# Patient Record
Sex: Male | Born: 1935 | Race: Black or African American | Hispanic: No | Marital: Single | Smoking: Never smoker
Health system: Southern US, Community
[De-identification: ages and names within clinical notes are randomized; demographics above are authoritative.]

## PROBLEM LIST (undated history)

## (undated) ENCOUNTER — Emergency Department (HOSPITAL_COMMUNITY): Discharge status: Absent without leave | Payer: Medicare Other

## (undated) DIAGNOSIS — C9000 Multiple myeloma not having achieved remission: Secondary | ICD-10-CM

## (undated) DIAGNOSIS — I5022 Chronic systolic (congestive) heart failure: Secondary | ICD-10-CM

## (undated) DIAGNOSIS — C61 Malignant neoplasm of prostate: Secondary | ICD-10-CM

## (undated) DIAGNOSIS — N183 Chronic kidney disease, stage 3 unspecified: Secondary | ICD-10-CM

## (undated) DIAGNOSIS — K279 Peptic ulcer, site unspecified, unspecified as acute or chronic, without hemorrhage or perforation: Secondary | ICD-10-CM

## (undated) DIAGNOSIS — Z95810 Presence of automatic (implantable) cardiac defibrillator: Secondary | ICD-10-CM

## (undated) DIAGNOSIS — I5043 Acute on chronic combined systolic (congestive) and diastolic (congestive) heart failure: Secondary | ICD-10-CM

## (undated) DIAGNOSIS — I2510 Atherosclerotic heart disease of native coronary artery without angina pectoris: Secondary | ICD-10-CM

## (undated) DIAGNOSIS — E119 Type 2 diabetes mellitus without complications: Secondary | ICD-10-CM

## (undated) DIAGNOSIS — I219 Acute myocardial infarction, unspecified: Secondary | ICD-10-CM

## (undated) DIAGNOSIS — D649 Anemia, unspecified: Secondary | ICD-10-CM

## (undated) DIAGNOSIS — C801 Malignant (primary) neoplasm, unspecified: Secondary | ICD-10-CM

## (undated) DIAGNOSIS — I10 Essential (primary) hypertension: Secondary | ICD-10-CM

## (undated) HISTORY — PX: APPENDECTOMY: SHX54

## (undated) HISTORY — PX: CARDIAC DEFIBRILLATOR PLACEMENT: SHX171

---

## 1997-11-24 HISTORY — PX: BACK SURGERY: SHX140

## 1998-11-24 HISTORY — PX: PROSTATE SURGERY: SHX751

## 2013-06-01 ENCOUNTER — Emergency Department (HOSPITAL_COMMUNITY)
Admission: EM | Admit: 2013-06-01 | Discharge: 2013-06-01 | Disposition: A | Discharge status: Home / Self Care | Payer: No Typology Code available for payment source | Attending: Emergency Medicine | Admitting: Emergency Medicine

## 2013-06-01 ENCOUNTER — Emergency Department (HOSPITAL_COMMUNITY): Payer: No Typology Code available for payment source

## 2013-06-01 DIAGNOSIS — S4980XA Other specified injuries of shoulder and upper arm, unspecified arm, initial encounter: Secondary | ICD-10-CM | POA: Insufficient documentation

## 2013-06-01 DIAGNOSIS — Y9389 Activity, other specified: Secondary | ICD-10-CM | POA: Insufficient documentation

## 2013-06-01 DIAGNOSIS — S0990XA Unspecified injury of head, initial encounter: Secondary | ICD-10-CM | POA: Insufficient documentation

## 2013-06-01 DIAGNOSIS — S46909A Unspecified injury of unspecified muscle, fascia and tendon at shoulder and upper arm level, unspecified arm, initial encounter: Secondary | ICD-10-CM | POA: Insufficient documentation

## 2013-06-01 DIAGNOSIS — Y92410 Unspecified street and highway as the place of occurrence of the external cause: Secondary | ICD-10-CM | POA: Insufficient documentation

## 2013-06-01 DIAGNOSIS — S6990XA Unspecified injury of unspecified wrist, hand and finger(s), initial encounter: Secondary | ICD-10-CM | POA: Insufficient documentation

## 2013-06-01 DIAGNOSIS — S199XXA Unspecified injury of neck, initial encounter: Secondary | ICD-10-CM | POA: Insufficient documentation

## 2013-06-01 DIAGNOSIS — I4891 Unspecified atrial fibrillation: Secondary | ICD-10-CM | POA: Insufficient documentation

## 2013-06-01 DIAGNOSIS — S0993XA Unspecified injury of face, initial encounter: Secondary | ICD-10-CM | POA: Insufficient documentation

## 2013-06-01 DIAGNOSIS — S59909A Unspecified injury of unspecified elbow, initial encounter: Secondary | ICD-10-CM | POA: Insufficient documentation

## 2013-06-01 LAB — COMPREHENSIVE METABOLIC PANEL
ALT: 27 U/L (ref 0–53)
Alkaline Phosphatase: 64 U/L (ref 39–117)
CO2: 28 mEq/L (ref 19–32)
GFR calc Af Amer: 55 mL/min — ABNORMAL LOW (ref >90)
GFR calc non Af Amer: 48 mL/min — ABNORMAL LOW (ref >90)
Glucose, Bld: 84 mg/dL (ref 70–99)
Potassium: 3.2 mEq/L — ABNORMAL LOW (ref 3.5–5.1)
Sodium: 141 mEq/L (ref 135–145)

## 2013-06-01 LAB — CBC
Hemoglobin: 12.3 g/dL — ABNORMAL LOW (ref 13.0–17.0)
RBC: 4.3 MIL/uL (ref 4.22–5.81)

## 2013-06-01 MED ORDER — IBUPROFEN 800 MG PO TABS: 800.0000 mg | ORAL_TABLET | Freq: Once | ORAL | Status: DC

## 2013-06-01 MED ORDER — KETOROLAC TROMETHAMINE 30 MG/ML IJ SOLN
15.0000 mg | Freq: Once | INTRAMUSCULAR | Status: AC
  Administered 2013-06-01: 15 mg via INTRAVENOUS
  Filled 2013-06-01: qty 1

## 2013-06-01 MED ORDER — ASPIRIN 81 MG PO CHEW
324.0000 mg | CHEWABLE_TABLET | Freq: Once | ORAL | Status: AC
  Administered 2013-06-01: 324 mg via ORAL
  Filled 2013-06-01: qty 4
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
## 2016-11-24 HISTORY — PX: CARDIAC DEFIBRILLATOR PLACEMENT: SHX171

## 2016-11-24 HISTORY — PX: CORONARY ANGIOPLASTY WITH STENT PLACEMENT: SHX49

## 2017-02-21 ENCOUNTER — Emergency Department (HOSPITAL_COMMUNITY): Payer: Medicare Other

## 2017-02-21 ENCOUNTER — Encounter (HOSPITAL_COMMUNITY): Payer: Self-pay | Admitting: Vascular Surgery

## 2017-02-21 ENCOUNTER — Inpatient Hospital Stay (HOSPITAL_COMMUNITY)
Admission: EM | Admit: 2017-02-21 | Discharge: 2017-02-24 | DRG: 291 | Disposition: A | Discharge status: Home / Self Care | Payer: Medicare Other | Attending: Internal Medicine | Admitting: Internal Medicine

## 2017-02-21 DIAGNOSIS — R079 Chest pain, unspecified: Secondary | ICD-10-CM

## 2017-02-21 DIAGNOSIS — N183 Chronic kidney disease, stage 3 unspecified: Secondary | ICD-10-CM

## 2017-02-21 DIAGNOSIS — Z7984 Long term (current) use of oral hypoglycemic drugs: Secondary | ICD-10-CM

## 2017-02-21 DIAGNOSIS — E1122 Type 2 diabetes mellitus with diabetic chronic kidney disease: Secondary | ICD-10-CM

## 2017-02-21 DIAGNOSIS — I5043 Acute on chronic combined systolic (congestive) and diastolic (congestive) heart failure: Secondary | ICD-10-CM | POA: Diagnosis present

## 2017-02-21 DIAGNOSIS — I130 Hypertensive heart and chronic kidney disease with heart failure and stage 1 through stage 4 chronic kidney disease, or unspecified chronic kidney disease: Secondary | ICD-10-CM | POA: Diagnosis not present

## 2017-02-21 DIAGNOSIS — R001 Bradycardia, unspecified: Secondary | ICD-10-CM | POA: Diagnosis not present

## 2017-02-21 DIAGNOSIS — Z7902 Long term (current) use of antithrombotics/antiplatelets: Secondary | ICD-10-CM

## 2017-02-21 DIAGNOSIS — I2510 Atherosclerotic heart disease of native coronary artery without angina pectoris: Secondary | ICD-10-CM | POA: Diagnosis present

## 2017-02-21 DIAGNOSIS — D696 Thrombocytopenia, unspecified: Secondary | ICD-10-CM | POA: Diagnosis present

## 2017-02-21 DIAGNOSIS — Z95810 Presence of automatic (implantable) cardiac defibrillator: Secondary | ICD-10-CM

## 2017-02-21 DIAGNOSIS — I252 Old myocardial infarction: Secondary | ICD-10-CM

## 2017-02-21 DIAGNOSIS — Z9861 Coronary angioplasty status: Secondary | ICD-10-CM

## 2017-02-21 DIAGNOSIS — I509 Heart failure, unspecified: Secondary | ICD-10-CM

## 2017-02-21 DIAGNOSIS — Z955 Presence of coronary angioplasty implant and graft: Secondary | ICD-10-CM

## 2017-02-21 DIAGNOSIS — I255 Ischemic cardiomyopathy: Secondary | ICD-10-CM | POA: Diagnosis present

## 2017-02-21 DIAGNOSIS — Z9111 Patient's noncompliance with dietary regimen: Secondary | ICD-10-CM

## 2017-02-21 DIAGNOSIS — I25119 Atherosclerotic heart disease of native coronary artery with unspecified angina pectoris: Secondary | ICD-10-CM | POA: Diagnosis not present

## 2017-02-21 DIAGNOSIS — Z8042 Family history of malignant neoplasm of prostate: Secondary | ICD-10-CM

## 2017-02-21 DIAGNOSIS — Z7982 Long term (current) use of aspirin: Secondary | ICD-10-CM

## 2017-02-21 DIAGNOSIS — Z8674 Personal history of sudden cardiac arrest: Secondary | ICD-10-CM

## 2017-02-21 DIAGNOSIS — Z8711 Personal history of peptic ulcer disease: Secondary | ICD-10-CM

## 2017-02-21 DIAGNOSIS — I10 Essential (primary) hypertension: Secondary | ICD-10-CM

## 2017-02-21 DIAGNOSIS — E785 Hyperlipidemia, unspecified: Secondary | ICD-10-CM | POA: Diagnosis present

## 2017-02-21 DIAGNOSIS — N400 Enlarged prostate without lower urinary tract symptoms: Secondary | ICD-10-CM | POA: Diagnosis present

## 2017-02-21 DIAGNOSIS — R51 Headache: Secondary | ICD-10-CM | POA: Diagnosis present

## 2017-02-21 DIAGNOSIS — D649 Anemia, unspecified: Secondary | ICD-10-CM | POA: Diagnosis present

## 2017-02-21 DIAGNOSIS — C9001 Multiple myeloma in remission: Secondary | ICD-10-CM | POA: Diagnosis present

## 2017-02-21 DIAGNOSIS — E876 Hypokalemia: Secondary | ICD-10-CM | POA: Diagnosis present

## 2017-02-21 DIAGNOSIS — M109 Gout, unspecified: Secondary | ICD-10-CM | POA: Diagnosis present

## 2017-02-21 DIAGNOSIS — Z79899 Other long term (current) drug therapy: Secondary | ICD-10-CM

## 2017-02-21 DIAGNOSIS — N179 Acute kidney failure, unspecified: Secondary | ICD-10-CM | POA: Diagnosis present

## 2017-02-21 HISTORY — DX: Type 2 diabetes mellitus without complications: E11.9

## 2017-02-21 HISTORY — DX: Malignant (primary) neoplasm, unspecified: C80.1

## 2017-02-21 HISTORY — DX: Presence of automatic (implantable) cardiac defibrillator: Z95.810

## 2017-02-21 HISTORY — DX: Chronic systolic (congestive) heart failure: I50.22

## 2017-02-21 HISTORY — DX: Acute on chronic combined systolic (congestive) and diastolic (congestive) heart failure: I50.43

## 2017-02-21 HISTORY — DX: Peptic ulcer, site unspecified, unspecified as acute or chronic, without hemorrhage or perforation: K27.9

## 2017-02-21 HISTORY — DX: Chronic kidney disease, stage 3 (moderate): N18.3

## 2017-02-21 HISTORY — DX: Chronic kidney disease, stage 3 unspecified: N18.30

## 2017-02-21 HISTORY — DX: Atherosclerotic heart disease of native coronary artery without angina pectoris: I25.10

## 2017-02-21 HISTORY — DX: Acute myocardial infarction, unspecified: I21.9

## 2017-02-21 HISTORY — DX: Essential (primary) hypertension: I10

## 2017-02-21 LAB — CBC
HEMATOCRIT: 28.2 % — AB (ref 39.0–52.0)
Hemoglobin: 9 g/dL — ABNORMAL LOW (ref 13.0–17.0)
MCH: 26.6 pg (ref 26.0–34.0)
MCHC: 31.9 g/dL (ref 30.0–36.0)
MCV: 83.4 fL (ref 78.0–100.0)
Platelets: 137 10*3/uL — ABNORMAL LOW (ref 150–400)
RBC: 3.38 MIL/uL — AB (ref 4.22–5.81)
RDW: 17.4 % — ABNORMAL HIGH (ref 11.5–15.5)
WBC: 5.6 10*3/uL (ref 4.0–10.5)

## 2017-02-21 LAB — BASIC METABOLIC PANEL
Anion gap: 8 (ref 5–15)
BUN: 23 mg/dL — AB (ref 6–20)
CO2: 26 mmol/L (ref 22–32)
Calcium: 8.5 mg/dL — ABNORMAL LOW (ref 8.9–10.3)
Chloride: 106 mmol/L (ref 101–111)
Creatinine, Ser: 1.83 mg/dL — ABNORMAL HIGH (ref 0.61–1.24)
GFR, EST AFRICAN AMERICAN: 38 mL/min — AB (ref >60)
GFR, EST NON AFRICAN AMERICAN: 33 mL/min — AB (ref >60)
GLUCOSE: 123 mg/dL — AB (ref 65–99)
Potassium: 3.1 mmol/L — ABNORMAL LOW (ref 3.5–5.1)
Sodium: 140 mmol/L (ref 135–145)

## 2017-02-21 LAB — POC OCCULT BLOOD, ED: FECAL OCCULT BLD: NEGATIVE

## 2017-02-21 LAB — RETICULOCYTES
RBC.: 3.18 MIL/uL — AB (ref 4.22–5.81)
RETIC COUNT ABSOLUTE: 38.2 10*3/uL (ref 19.0–186.0)
Retic Ct Pct: 1.2 % (ref 0.4–3.1)

## 2017-02-21 LAB — I-STAT TROPONIN, ED
TROPONIN I, POC: 0.03 ng/mL (ref 0.00–0.08)
Troponin i, poc: 0.04 ng/mL (ref 0.00–0.08)

## 2017-02-21 LAB — TROPONIN I: TROPONIN I: 0.03 ng/mL — AB (ref <0.03)

## 2017-02-21 LAB — FOLATE: FOLATE: 10.8 ng/mL (ref >5.9)

## 2017-02-21 LAB — BRAIN NATRIURETIC PEPTIDE: B Natriuretic Peptide: 941.9 pg/mL — ABNORMAL HIGH (ref 0.0–100.0)

## 2017-02-21 MED ORDER — HYDRALAZINE HCL 50 MG PO TABS
50.0000 mg | ORAL_TABLET | Freq: Two times a day (BID) | ORAL | Status: DC
  Administered 2017-02-22: 50 mg via ORAL
  Filled 2017-02-21: qty 1

## 2017-02-21 MED ORDER — NITROGLYCERIN 2 % TD OINT
0.5000 [in_us] | TOPICAL_OINTMENT | Freq: Four times a day (QID) | TRANSDERMAL | Status: DC
  Administered 2017-02-22 – 2017-02-23 (×3): 0.5 [in_us] via TOPICAL
  Filled 2017-02-21: qty 30

## 2017-02-21 MED ORDER — FUROSEMIDE 10 MG/ML IJ SOLN
40.0000 mg | Freq: Once | INTRAMUSCULAR | Status: AC
  Administered 2017-02-21: 40 mg via INTRAVENOUS
  Filled 2017-02-21: qty 4

## 2017-02-21 MED ORDER — MORPHINE SULFATE (PF) 4 MG/ML IV SOLN
2.0000 mg | Freq: Once | INTRAVENOUS | Status: AC
  Administered 2017-02-21: 2 mg via INTRAVENOUS
  Filled 2017-02-21: qty 1

## 2017-02-21 MED ORDER — ALLOPURINOL 300 MG PO TABS
300.0000 mg | ORAL_TABLET | Freq: Every day | ORAL | Status: DC
  Administered 2017-02-22 – 2017-02-24 (×3): 300 mg via ORAL
  Filled 2017-02-21 (×3): qty 1

## 2017-02-21 MED ORDER — POTASSIUM CHLORIDE CRYS ER 20 MEQ PO TBCR
40.0000 meq | EXTENDED_RELEASE_TABLET | Freq: Once | ORAL | Status: AC
  Administered 2017-02-21: 40 meq via ORAL
  Filled 2017-02-21: qty 2

## 2017-02-21 MED ORDER — TAMSULOSIN HCL 0.4 MG PO CAPS
0.4000 mg | ORAL_CAPSULE | Freq: Every day | ORAL | Status: DC
  Administered 2017-02-22 – 2017-02-24 (×3): 0.4 mg via ORAL
  Filled 2017-02-21 (×3): qty 1

## 2017-02-21 MED ORDER — ATORVASTATIN CALCIUM 40 MG PO TABS
40.0000 mg | ORAL_TABLET | Freq: Every day | ORAL | Status: DC
  Administered 2017-02-22 – 2017-02-23 (×3): 40 mg via ORAL
  Filled 2017-02-21 (×3): qty 1

## 2017-02-21 MED ORDER — ASPIRIN EC 81 MG PO TBEC
81.0000 mg | DELAYED_RELEASE_TABLET | Freq: Every day | ORAL | Status: DC
  Administered 2017-02-22 – 2017-02-24 (×3): 81 mg via ORAL
  Filled 2017-02-21 (×3): qty 1

## 2017-02-21 MED ORDER — CLOPIDOGREL BISULFATE 75 MG PO TABS
75.0000 mg | ORAL_TABLET | Freq: Every day | ORAL | Status: DC
  Administered 2017-02-22 – 2017-02-24 (×3): 75 mg via ORAL
  Filled 2017-02-21 (×3): qty 1

## 2017-02-21 MED ORDER — CARVEDILOL 12.5 MG PO TABS
12.5000 mg | ORAL_TABLET | Freq: Two times a day (BID) | ORAL | Status: DC
  Administered 2017-02-22 – 2017-02-24 (×5): 12.5 mg via ORAL
  Filled 2017-02-21 (×5): qty 1

## 2017-02-21 MED ORDER — FUROSEMIDE 10 MG/ML IJ SOLN
60.0000 mg | Freq: Two times a day (BID) | INTRAMUSCULAR | Status: DC
  Administered 2017-02-22: 60 mg via INTRAVENOUS
  Filled 2017-02-21: qty 6

## 2017-02-21 MED ORDER — LATANOPROST 0.005 % OP SOLN
1.0000 [drp] | Freq: Every day | OPHTHALMIC | Status: DC
  Administered 2017-02-22 – 2017-02-23 (×3): 1 [drp] via OPHTHALMIC
  Filled 2017-02-21: qty 2.5

## 2017-02-21 MED ORDER — ACETAMINOPHEN 325 MG PO TABS: 650.0000 mg | ORAL_TABLET | ORAL | Status: DC | PRN

## 2017-02-21 MED ORDER — INSULIN ASPART 100 UNIT/ML ~~LOC~~ SOLN: 0.0000 [IU] | Freq: Three times a day (TID) | SUBCUTANEOUS | Status: DC

## 2017-02-21 MED ORDER — POTASSIUM CHLORIDE CRYS ER 20 MEQ PO TBCR
40.0000 meq | EXTENDED_RELEASE_TABLET | Freq: Two times a day (BID) | ORAL | Status: DC
  Administered 2017-02-22: 40 meq via ORAL
  Filled 2017-02-21 (×2): qty 2

## 2017-02-21 MED ORDER — ONDANSETRON HCL 4 MG/2ML IJ SOLN: 4.0000 mg | Freq: Four times a day (QID) | INTRAMUSCULAR | Status: DC | PRN

## 2017-02-21 MED ORDER — ASPIRIN 81 MG PO CHEW
162.0000 mg | CHEWABLE_TABLET | Freq: Once | ORAL | Status: AC
  Administered 2017-02-21: 162 mg via ORAL
  Filled 2017-02-21: qty 2

## 2017-02-21 MED ORDER — AMIODARONE HCL 200 MG PO TABS
200.0000 mg | ORAL_TABLET | Freq: Two times a day (BID) | ORAL | Status: DC
  Administered 2017-02-22 – 2017-02-23 (×4): 200 mg via ORAL
  Filled 2017-02-21 (×4): qty 1

## 2017-02-21 MED ORDER — MORPHINE SULFATE (PF) 4 MG/ML IV SOLN: 2.0000 mg | INTRAVENOUS | Status: DC | PRN

## 2017-02-21 MED ORDER — ISOSORBIDE DINITRATE 20 MG PO TABS
20.0000 mg | ORAL_TABLET | Freq: Two times a day (BID) | ORAL | Status: DC
  Administered 2017-02-22 (×2): 20 mg via ORAL
  Filled 2017-02-21: qty 2
  Filled 2017-02-21 (×2): qty 1

## 2017-02-21 NOTE — H&P (Addendum)
History and Physical    David Guerra MKL:[id number] DOB: 1933-05-29 DOA: 02/21/2017  PCP: Pcp Not In System The patient is from [place].  I did not see any available records in Care Everywhere.  Patient coming from: Daughter's house  Chief Complaint: Chest pain, shortness of breath  HPI: David Guerra is a 83 y.o. gentleman with a history of CAD and recent MI in December 2017 S/P 2 stents, then AICD placement in January 2018, known CHF but he is not sure what type, Multiple Myeloma (in remission; no active treatment in 7 years), HTN, DM with CKD (last creatinine in this system was 4 years ago; 1.37 then; baseline unclear), and PUD who presents to the ED for evaluation of chest pain.  The patient describes a left sided chest pressure that waxes and wanes in intensity.  No specific triggers identified.  It is associated with shortness of breath and light-headedness.  No LOC.  No nausea, vomiting, or diaphoresis.  Pain is 8 out of 10 in intensity at its worst, 4 out of 10 after pain medication in the ED.  He feels that his symptoms are similar to his presentation in December, when he had his acute MI.  He admits that he wakes up most morning feeling fatigued, but he usually improves by midday.  Today, he had persist fatigue, shortness of breath with minimal exertion, then the left-sided chest pain, which prompted presentation.  He has not been shocked by his AICD since being in [place].  He has had progressive lower extremity edema.  He does not check his weight daily.  He reports last known weight to be around 183 lbs in [place].  He has been in [place] for about two weeks, and he admits to dietary indiscretions.  He does not follow a fluid restriction.  He has not had calf pain or tenderness.  ED Course: EKG shows an atrial paced rhythm.  Nonspecific ST changes.  Troponin 0.04.  BNP 942.  BUN 23, creatinine 1.83 (last known reference value 1.37 from 2014).  Hgb 9; it was 12 in  2014.  Platelets mildly decreased at 137.  Potassium 3.1.  Fecal occult blood test is negative.  Chest xray shows vascular congestion, mild cardiomegaly, and mild interstitial edema.  AICD interrogated in the ED; reportedly, no shocks have been delivered.  The patient has received ASA 162 in the ED (he has already taken his baby aspirin and plavix today), lasix 39m IV, potassium 411m PO, and morphine 53m63mV.  A second dose of morphine is pending.  He is still having active chest pain.  Hospitalist asked to admit.  Review of Systems: As per HPI otherwise 10 systems reviewed and are negative except as stated in the HPI.   Past Medical History:  Diagnosis Date  . AICD (automatic cardioverter/defibrillator) present   . Cancer ([facility]  mulitple myeloma  . CHF (congestive heart failure) ([facility] . Coronary artery disease   . Diabetes mellitus without complication ([place] [place] . Hypertension   . Myocardial infarction   . PUD (peptic ulcer disease)     Past Surgical History:  Procedure Laterality Date  . APPENDECTOMY    . BACK SURGERY    . PROSTATE SURGERY       reports that he has never smoked. He has never used smokeless tobacco. He reports that he drinks alcohol. He reports that he does not use drugs.  Social EtOH use.  He is not  married.  He has four children, who share decision making.  No Known Allergies  Family History  Problem Relation Age of Onset  . Dementia Mother   . Cancer Father   . Prostate cancer Brother   . Prostate cancer Brother      Prior to Admission medications   Medication Sig Start Date End Date Taking? Authorizing Provider  allopurinol (ZYLOPRIM) 300 MG tablet Take 300 mg by mouth daily. For gout 01/16/17  Yes Historical Provider, MD  amiodarone (PACERONE) 200 MG tablet Take 200 mg by mouth 2 (two) times daily. 02/12/17  Yes Historical Provider, MD  aspirin EC 81 MG tablet Take 81 mg by mouth daily. For heart   Yes Historical Provider, MD  atorvastatin  (LIPITOR) 80 MG tablet Take 40 mg by mouth at bedtime. For lowering cholesterol 01/16/17  Yes Historical Provider, MD  carvedilol (COREG) 12.5 MG tablet Take 12.5 mg by mouth every 12 (twelve) hours. For heart 01/16/17  Yes Historical Provider, MD  clopidogrel (PLAVIX) 75 MG tablet Take 75 mg by mouth daily. For blood thinning 01/29/17  Yes Historical Provider, MD  fluticasone (FLONASE) 50 MCG/ACT nasal spray Place 1 spray into both nostrils at bedtime. 01/29/17  Yes Historical Provider, MD  furosemide (LASIX) 40 MG tablet Take 40 mg by mouth 2 (two) times daily.   Yes Historical Provider, MD  glipiZIDE (GLUCOTROL) 5 MG tablet Take 5 mg by mouth daily. For diabetes 01/16/17  Yes Historical Provider, MD  hydrALAZINE (APRESOLINE) 50 MG tablet Take 50 mg by mouth 2 (two) times daily. For blood pressure 01/16/17  Yes Historical Provider, MD  isosorbide dinitrate (ISORDIL) 20 MG tablet Take 20 mg by mouth 2 (two) times daily. To prevent chest pain or pressure 01/16/17  Yes Historical Provider, MD  latanoprost (XALATAN) 0.005 % ophthalmic solution Place 1 drop into both eyes at bedtime.   Yes Historical Provider, MD  potassium chloride SA (K-DUR,KLOR-CON) 20 MEQ tablet Take 20 mEq by mouth 2 (two) times daily.   Yes Historical Provider, MD  tamsulosin (FLOMAX) 0.4 MG CAPS capsule Take 0.4 mg by mouth daily. 01/16/17  Yes Historical Provider, MD  vitamin B-12 (CYANOCOBALAMIN) 1000 MCG tablet Take 1,000 mcg by mouth daily.   Yes Historical Provider, MD    Physical Exam: Vitals:   02/21/17 2030 02/21/17 2045 02/21/17 2100 02/21/17 2219  BP: (!) 162/77 (!) 172/77 (!) 142/73 (!) 178/93  Pulse: (!) 59 61 62 60  Resp: 18 12 16 15   Temp:      TempSrc:      SpO2: 95% 98% 95% 95%      Constitutional: NAD, NONtoxic appearing but somewhat anxious, concerned that he is having another heart attack Vitals:   02/21/17 2030 02/21/17 2045 02/21/17 2100 02/21/17 2219  BP: (!) 162/77 (!) 172/77 (!) 142/73 (!) 178/93    Pulse: (!) 59 61 62 60  Resp: 18 12 16 15   Temp:      TempSrc:      SpO2: 95% 98% 95% 95%   Eyes: PERRL, lids and conjunctivae normal ENMT: Mucous membranes are moist. Posterior pharynx clear of any exudate or lesions. He is wearing dentures. Neck: normal appearance, supple, no masses Respiratory: clear to auscultation bilaterally, no wheezing, no crackles. Normal respiratory effort. No accessory muscle use.  Cardiovascular: Normal rate, regular rhythm, no murmurs / rubs / gallops. 2+ pretibial and ankle edema in bilateral lower extremities.  2+ pedal pulses. GI: abdomen is soft and compressible.  Mildly distended  but he feels this is baseline.  No tenderness.  Bowel sounds are present. Musculoskeletal:  No joint deformity in upper and lower extremities. Good ROM, no contractures. Normal muscle tone.  Skin: no rashes, warm and dry Neurologic: CN 2-12 grossly intact. Strength symmetric bilaterally, generalized weakness. Psychiatric: Normal judgment and insight. Alert and oriented x 3. Normal mood.     Labs on Admission: I have personally reviewed following labs and imaging studies  CBC:  Recent Labs Lab 02/21/17 1926  WBC 5.6  HGB 9.0*  HCT 28.2*  MCV 83.4  PLT 626*   Basic Metabolic Panel:  Recent Labs Lab 02/21/17 1926  NA 140  K 3.1*  CL 106  CO2 26  GLUCOSE 123*  BUN 23*  CREATININE 1.83*  CALCIUM 8.5*   GFR: CrCl cannot be calculated (Unknown ideal weight.).  Cardiac Enzymes: First troponin 0.04  BNP (last 3 results) 942  Radiological Exams on Admission: Dg Chest 2 View  Result Date: 02/21/2017 CLINICAL DATA:  Acute onset of shortness of breath and generalized chest pain. Nausea and headache. Initial encounter. EXAM: CHEST  2 VIEW COMPARISON:  Chest radiograph performed 06/01/2013 FINDINGS: The lungs are well-aerated. Vascular congestion is noted. Increased interstitial markings raise question for mild interstitial edema. There is no evidence of pleural  effusion or pneumothorax. The heart is mildly enlarged. A pacemaker/AICD is noted at the left chest wall, with leads ending at the right atrium and right ventricle. No acute osseous abnormalities are seen. IMPRESSION: Vascular congestion and mild cardiomegaly. Increased interstitial markings raise question for mild interstitial edema. Electronically Signed   By: Garald Balding M.D.   On: 02/21/2017 20:25    EKG: Independently reviewed by me. Atrial paced.  Assessment/Plan Principal Problem:   Acute exacerbation of CHF (congestive heart failure) (HCC) Active Problems:   Chest pain   HTN (hypertension)   AICD (automatic cardioverter/defibrillator) present   CAD (coronary artery disease)   Diabetes ([place])      Active chest pain with acute CHF exacerbation.  Type of CHF unknown and outside records are not available at this time.  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 --Stepdown unit, telemetry monitoring --Add nitropaste 0.5 inch q6h now.  Can continue to use prn IV morphine.  If we cannot get him chest pain free overnight, will likely warrant discussion with cardiology. --Continue baby aspirin and plavix daily --Continue Coreg, hydralazine, isosorbide dinitrate --Continue statin --Continue amiodarone --Diuresis with IV lasix --Strict I/O --Daily weights --1500cc fluid restriction --Complete echo in the AM --Serial troponin  CKD 2-3, renal baseline unclear.  Likely mild AKI in the setting of CHF exacerbation --Follow trend with diuresis  Diabetes --Hold glipizide --Glucose checks AC/HS, low dose SSI coverage since he has renal insufficiency  Presumed history of gout --Allopurinol  Presumed BPH --Flomax  Hypokalemia --Replacement ordered in the ED then continue 54mq BID while being diuresed  Acute on chronic anemia, thrombocytopenia.   Baseline unknown.  Multiple myeloma reported to  be in remission.  No active treatment at this time. --Anemia panel --Monitor for transfusion requirement    DVT prophylaxis: SCDs Code Status: FULL Family Communication: Daughter present in the ED at time of admission. Disposition Plan: To be determined. Consults called: None but he is high risk, and I expect he will need formal cardiology consultation in the AM. Admission status: Place in observation, stepdown unit for active chest pain.  TIME SPENT: 70 minutes   Eber Jones MD [facility] Pager [phone]  If 7PM-7AM, please contact night-coverage [email or web address] Password [facility]  02/21/2017, 10:33 PM

## 2017-02-21 NOTE — ED Provider Notes (Signed)
[facility] DEPT Provider Note   CSN: [id number] Arrival date & time: 02/21/17  5732     History   Chief Complaint Chief Complaint  Patient presents with  . Chest Pain    HPI David Guerra is a 83 y.o. male.  HPI  83 year old male with history of multiple melanoma "in remission", CAD s/p cardiac arrest in September at [facility] in [place], requiring PCI with 2 stents to his heart (patient is unsure which vessels), CHF with [facility] Hights in place, on aspirin and Plavix, DM 2, and HTN, who presents with chest pain, lightheadedness, and headache.   Chest pain began this morning. Described as a substernal pressure sensation that doesn't radiate. Pain is nonpleuritic and nonexertional. Waxes and wanes in intensity. Patient denies nausea, vomiting, or diaphoresis. States he feels fatigued. He is not prescribed nitroglycerin. Also reports some shortness of breath that is worse with lying flat. He reports compliance with his home lasix.   Headache is described as mild but annoying, frontal, throbbing. States he gets headaches like this intermittently and this is no different from what he's had in the past. No neck pain or stiffness. No vision changes, focal weakness, or focal sensory deficits.  Patient has a Environmental manager. Defibrillator has not gone off since a week after he is discharged after his stenting in September. He does report feeling some palpitations and lightheadedness earlier in the day today. No recent illnesses including fever, chills, body aches, abdominal pain.   All of the patient's medical care is at various hospitals in [place] and [place]; he is currently visiting his daughter here in [name].  Past Medical History:  Diagnosis Date  . AICD (automatic cardioverter/defibrillator) present   . Cancer ([place])    mulitple myeloma  . CHF (congestive heart failure) ([place])   . Coronary artery disease   . Diabetes  mellitus without complication ([place])   . Hypertension   . Myocardial infarction   . PUD (peptic ulcer disease)     Patient Active Problem List   Diagnosis Date Noted  . Chest pain 02/21/2017  . Acute exacerbation of CHF (congestive heart failure) ([place]) 02/21/2017  . HTN (hypertension) 02/21/2017  . AICD (automatic cardioverter/defibrillator) present 02/21/2017  . CAD (coronary artery disease) 02/21/2017  . Diabetes ([place]) 02/21/2017    Past Surgical History:  Procedure Laterality Date  . APPENDECTOMY    . BACK SURGERY    . PROSTATE SURGERY         Home Medications    Prior to Admission medications   Medication Sig Start Date End Date Taking? Authorizing Provider  allopurinol (ZYLOPRIM) 300 MG tablet Take 300 mg by mouth daily. For gout 01/16/17  Yes Historical Provider, MD  amiodarone (PACERONE) 200 MG tablet Take 200 mg by mouth 2 (two) times daily. 02/12/17  Yes Historical Provider, MD  aspirin EC 81 MG tablet Take 81 mg by mouth daily. For heart   Yes Historical Provider, MD  atorvastatin (LIPITOR) 80 MG tablet Take 40 mg by mouth at bedtime. For lowering cholesterol 01/16/17  Yes Historical Provider, MD  carvedilol (COREG) 12.5 MG tablet Take 12.5 mg by mouth every 12 (twelve) hours. For heart 01/16/17  Yes Historical Provider, MD  clopidogrel (PLAVIX) 75 MG tablet Take 75 mg by mouth daily. For blood thinning 01/29/17  Yes Historical Provider, MD  fluticasone (FLONASE) 50 MCG/ACT nasal spray Place 1 spray into both nostrils at bedtime. 01/29/17  Yes Historical Provider, MD  furosemide (LASIX) 40 MG tablet Take 40 mg by mouth 2 (two) times daily.   Yes Historical Provider, MD  glipiZIDE (GLUCOTROL) 5 MG tablet Take 5 mg by mouth daily. For diabetes 01/16/17  Yes Historical Provider, MD  hydrALAZINE (APRESOLINE) 50 MG tablet Take 50 mg by mouth 2 (two) times daily. For blood pressure 01/16/17  Yes Historical Provider, MD  isosorbide dinitrate (ISORDIL) 20 MG tablet Take 20 mg by mouth  2 (two) times daily. To prevent chest pain or pressure 01/16/17  Yes Historical Provider, MD  latanoprost (XALATAN) 0.005 % ophthalmic solution Place 1 drop into both eyes at bedtime.   Yes Historical Provider, MD  potassium chloride SA (K-DUR,KLOR-CON) 20 MEQ tablet Take 20 mEq by mouth 2 (two) times daily.   Yes Historical Provider, MD  tamsulosin (FLOMAX) 0.4 MG CAPS capsule Take 0.4 mg by mouth daily. 01/16/17  Yes Historical Provider, MD  vitamin B-12 (CYANOCOBALAMIN) 1000 MCG tablet Take 1,000 mcg by mouth daily.   Yes Historical Provider, MD    Family History Family History  Problem Relation Age of Onset  . Dementia Mother   . Cancer Father   . Prostate cancer Brother   . Prostate cancer Brother     Social History Social History  Substance Use Topics  . Smoking status: Never Smoker  . Smokeless tobacco: Never Used  . Alcohol use Yes     Comment: occasionally     Allergies   Patient has no known allergies.   Review of Systems Review of Systems  Constitutional: Negative for chills and fever.  HENT: Negative for congestion.   Eyes: Negative for visual disturbance.  Respiratory: Positive for shortness of breath. Negative for cough and wheezing.   Cardiovascular: Positive for chest pain, palpitations and leg swelling.  Gastrointestinal: Negative for abdominal pain, diarrhea, nausea and vomiting.  Genitourinary: Negative for dysuria and frequency.  Musculoskeletal: Negative for arthralgias, back pain, myalgias, neck pain and neck stiffness.  Skin: Negative for rash.  Neurological: Positive for light-headedness (resolved). Negative for dizziness, syncope, weakness, numbness and headaches.  Psychiatric/Behavioral: Negative for agitation, behavioral problems and confusion.     Physical Exam Updated Vital Signs BP (!) 178/93   Pulse 60   Temp 98.1 F (36.7 C) (Oral)   Resp 15   SpO2 95%   Physical Exam  Constitutional: He is oriented to person, place, and time. He  appears well-developed and well-nourished. No distress.  HENT:  Head: Normocephalic and atraumatic.  Eyes: Conjunctivae and EOM are normal. Pupils are equal, round, and reactive to light.  Neck: Normal range of motion. Neck supple.  Cardiovascular: Normal rate, regular rhythm, normal heart sounds and intact distal pulses.   No murmur heard. Pulmonary/Chest: Effort normal and breath sounds normal. No respiratory distress.  Crackles present at the bilateral bases  Abdominal: Soft. Bowel sounds are normal. He exhibits no distension. There is no tenderness. There is no guarding.  Musculoskeletal: He exhibits edema (2+ pitting edema to the bilateral LEs).  Neurological: He is alert and oriented to person, place, and time. He exhibits normal muscle tone.  Face symmetric, tongue midline. 5/5 strength in the proximal and distal upper and lower extremities bilaterally, with intact sensation to light touch. Normal finger to nose, heel to shin, and rapid alternating movements. Normal speech.    Skin: Skin is warm and dry. He is not diaphoretic.  Psychiatric: He has a normal mood and affect.  Nursing note and vitals reviewed.    ED Treatments /  Results  Labs (all labs ordered are listed, but only abnormal results are displayed) Labs Reviewed  BASIC METABOLIC PANEL - Abnormal; Notable for the following:       Result Value   Potassium 3.1 (*)    Glucose, Bld 123 (*)    BUN 23 (*)    Creatinine, Ser 1.83 (*)    Calcium 8.5 (*)    GFR calc non Af Amer 33 (*)    GFR calc Af Amer 38 (*)    All other components within normal limits  CBC - Abnormal; Notable for the following:    RBC 3.38 (*)    Hemoglobin 9.0 (*)    HCT 28.2 (*)    RDW 17.4 (*)    Platelets 137 (*)    All other components within normal limits  BRAIN NATRIURETIC PEPTIDE - Abnormal; Notable for the following:    B Natriuretic Peptide 941.9 (*)    All other components within normal limits  BASIC METABOLIC PANEL  TROPONIN I    TROPONIN I  TROPONIN I  VITAMIN B12  FOLATE  IRON AND TIBC  FERRITIN  RETICULOCYTES  CBC WITH DIFFERENTIAL/PLATELET  HEPATIC FUNCTION PANEL  PROTIME-INR  BRAIN NATRIURETIC PEPTIDE  I-STAT TROPOININ, ED  POC OCCULT BLOOD, ED  I-STAT TROPOININ, ED    EKG  EKG Interpretation  Date/Time:  Saturday February 21 2017 19:24:58 EDT Ventricular Rate:  60 PR Interval:    QRS Duration: 104 QT Interval:  498 QTC Calculation: 498 R Axis:   90 Text Interpretation:  Atrial-paced rhythm with prolonged AV conduction Rightward axis Nonspecific ST and T wave abnormality Abnormal ECG Confirmed by KNAPP  MD-J, JON ([id number]) on 02/21/2017 8:25:46 PM       Radiology Dg Chest 2 View  Result Date: 02/21/2017 CLINICAL DATA:  Acute onset of shortness of breath and generalized chest pain. Nausea and headache. Initial encounter. EXAM: CHEST  2 VIEW COMPARISON:  Chest radiograph performed 06/01/2013 FINDINGS: The lungs are well-aerated. Vascular congestion is noted. Increased interstitial markings raise question for mild interstitial edema. There is no evidence of pleural effusion or pneumothorax. The heart is mildly enlarged. A pacemaker/AICD is noted at the left chest wall, with leads ending at the right atrium and right ventricle. No acute osseous abnormalities are seen. IMPRESSION: Vascular congestion and mild cardiomegaly. Increased interstitial markings raise question for mild interstitial edema. Electronically Signed   By: Garald Balding M.D.   On: 02/21/2017 20:25    Procedures Procedures (including critical care time)  Medications Ordered in ED Medications  nitroGLYCERIN (NITROGLYN) 2 % ointment 0.5 inch (not administered)  allopurinol (ZYLOPRIM) tablet 300 mg (not administered)  amiodarone (PACERONE) tablet 200 mg (not administered)  aspirin EC tablet 81 mg (not administered)  atorvastatin (LIPITOR) tablet 40 mg (not administered)  carvedilol (COREG) tablet 12.5 mg (not administered)   clopidogrel (PLAVIX) tablet 75 mg (not administered)  hydrALAZINE (APRESOLINE) tablet 50 mg (not administered)  isosorbide dinitrate (ISORDIL) tablet 20 mg (not administered)  latanoprost (XALATAN) 0.005 % ophthalmic solution 1 drop (not administered)  potassium chloride SA (K-DUR,KLOR-CON) CR tablet 40 mEq (not administered)  tamsulosin (FLOMAX) capsule 0.4 mg (not administered)  acetaminophen (TYLENOL) tablet 650 mg (not administered)  ondansetron (ZOFRAN) injection 4 mg (not administered)  insulin aspart (novoLOG) injection 0-9 Units (not administered)  morphine 4 MG/ML injection 2 mg (not administered)  furosemide (LASIX) injection 60 mg (not administered)  aspirin chewable tablet 162 mg (162 mg Oral Given 02/21/17 2101)  morphine 4  MG/ML injection 2 mg (2 mg Intravenous Given 02/21/17 2114)  furosemide (LASIX) injection 40 mg (40 mg Intravenous Given 02/21/17 2216)  potassium chloride SA (K-DUR,KLOR-CON) CR tablet 40 mEq (40 mEq Oral Given 02/21/17 2216)  morphine 4 MG/ML injection 2 mg (2 mg Intravenous Given 02/21/17 2215)     Initial Impression / Assessment and Plan / ED Course  I have reviewed the triage vital signs and the nursing notes.  Pertinent labs & imaging results that were available during my care of the patient were reviewed by me and considered in my medical decision making (see chart for details).     Afebrile and hemodynamically stable. Patient reports left-sided chest pressure. EKG shows an atrial paced rhythm, with nonspecific ST changes. First troponin is 0.04, with delta of 0.03 . Delta ordered. Patient given additional aspirin to increase daily total to 324 mg. Pt's pain improved with IV morphine.   Patient's [employer] pacemaker was interrogated, with no shocks delivered.  BNP is elevated at 941. Chest x-ray also shows pulmonary vascular congestion. 40 mg of IV Lasix given.  Patient is hypokalemic with potassium of 3.1. 40 mEq of oral potassium given.  Creatinine elevated to 1.83. This may reflect an AKI, though the patient's current labs are [place].  Patient's hemoglobin today is 9. He is unsure whether or not he has a history of anemia. Rectal exam performed with no gross blood and Hemoccult negative stool in the rectal vault. I doubt GI bleed.  Patient reports a headache, but describes it as mild and similar to headaches he's had in the past. Neuro exam unremarkable as above. He denies head trauma. No indication for imaging at this time. Will continue to monitor.  Will admit to the hospitalist service for management of high risk chest pain.  Care of patient overseen by my attending, Dr. Tomi Bamberger.  Final Clinical Impressions(s) / ED Diagnoses   Final diagnoses:  Chest pain, unspecified type    New Prescriptions New Prescriptions   No medications on file     Zipporah Plants, MD 02/21/17 [place], MD 02/22/17 1731

## 2017-02-21 NOTE — ED Triage Notes (Signed)
Pt reports to the ED for eval of SOB and CP. He also reports some nausea and a HA as well. Pt reports he had similar symptoms when he had an MI. He states that he woke up this am feeling poorly and has become progressively worse. He took 81 mg of ASA PTA. Per pt last MI was 12/22. He states that they placed 2 stents. HE also has a defibrillator in place.

## 2017-02-22 ENCOUNTER — Observation Stay (HOSPITAL_BASED_OUTPATIENT_CLINIC_OR_DEPARTMENT_OTHER): Payer: Medicare Other

## 2017-02-22 DIAGNOSIS — D696 Thrombocytopenia, unspecified: Secondary | ICD-10-CM | POA: Diagnosis present

## 2017-02-22 DIAGNOSIS — Z7982 Long term (current) use of aspirin: Secondary | ICD-10-CM | POA: Diagnosis not present

## 2017-02-22 DIAGNOSIS — C9001 Multiple myeloma in remission: Secondary | ICD-10-CM | POA: Diagnosis present

## 2017-02-22 DIAGNOSIS — I2510 Atherosclerotic heart disease of native coronary artery without angina pectoris: Secondary | ICD-10-CM | POA: Diagnosis not present

## 2017-02-22 DIAGNOSIS — R079 Chest pain, unspecified: Secondary | ICD-10-CM | POA: Diagnosis present

## 2017-02-22 DIAGNOSIS — E876 Hypokalemia: Secondary | ICD-10-CM | POA: Diagnosis present

## 2017-02-22 DIAGNOSIS — Z7902 Long term (current) use of antithrombotics/antiplatelets: Secondary | ICD-10-CM | POA: Diagnosis not present

## 2017-02-22 DIAGNOSIS — I5043 Acute on chronic combined systolic (congestive) and diastolic (congestive) heart failure: Secondary | ICD-10-CM | POA: Diagnosis not present

## 2017-02-22 DIAGNOSIS — I509 Heart failure, unspecified: Secondary | ICD-10-CM | POA: Diagnosis not present

## 2017-02-22 DIAGNOSIS — M109 Gout, unspecified: Secondary | ICD-10-CM | POA: Diagnosis present

## 2017-02-22 DIAGNOSIS — I5023 Acute on chronic systolic (congestive) heart failure: Secondary | ICD-10-CM | POA: Diagnosis not present

## 2017-02-22 DIAGNOSIS — E1122 Type 2 diabetes mellitus with diabetic chronic kidney disease: Secondary | ICD-10-CM | POA: Diagnosis present

## 2017-02-22 DIAGNOSIS — I130 Hypertensive heart and chronic kidney disease with heart failure and stage 1 through stage 4 chronic kidney disease, or unspecified chronic kidney disease: Secondary | ICD-10-CM | POA: Diagnosis present

## 2017-02-22 DIAGNOSIS — D649 Anemia, unspecified: Secondary | ICD-10-CM

## 2017-02-22 DIAGNOSIS — Z7984 Long term (current) use of oral hypoglycemic drugs: Secondary | ICD-10-CM | POA: Diagnosis not present

## 2017-02-22 DIAGNOSIS — I255 Ischemic cardiomyopathy: Secondary | ICD-10-CM | POA: Diagnosis present

## 2017-02-22 DIAGNOSIS — Z955 Presence of coronary angioplasty implant and graft: Secondary | ICD-10-CM | POA: Diagnosis not present

## 2017-02-22 DIAGNOSIS — Z95810 Presence of automatic (implantable) cardiac defibrillator: Secondary | ICD-10-CM | POA: Diagnosis not present

## 2017-02-22 DIAGNOSIS — N179 Acute kidney failure, unspecified: Secondary | ICD-10-CM | POA: Diagnosis present

## 2017-02-22 DIAGNOSIS — Z8674 Personal history of sudden cardiac arrest: Secondary | ICD-10-CM | POA: Diagnosis not present

## 2017-02-22 DIAGNOSIS — E785 Hyperlipidemia, unspecified: Secondary | ICD-10-CM | POA: Diagnosis present

## 2017-02-22 DIAGNOSIS — I252 Old myocardial infarction: Secondary | ICD-10-CM | POA: Diagnosis not present

## 2017-02-22 DIAGNOSIS — R51 Headache: Secondary | ICD-10-CM | POA: Diagnosis present

## 2017-02-22 DIAGNOSIS — N183 Chronic kidney disease, stage 3 (moderate): Secondary | ICD-10-CM | POA: Diagnosis present

## 2017-02-22 DIAGNOSIS — Z79899 Other long term (current) drug therapy: Secondary | ICD-10-CM | POA: Diagnosis not present

## 2017-02-22 DIAGNOSIS — Z8711 Personal history of peptic ulcer disease: Secondary | ICD-10-CM | POA: Diagnosis not present

## 2017-02-22 DIAGNOSIS — N400 Enlarged prostate without lower urinary tract symptoms: Secondary | ICD-10-CM | POA: Diagnosis present

## 2017-02-22 DIAGNOSIS — Z8042 Family history of malignant neoplasm of prostate: Secondary | ICD-10-CM | POA: Diagnosis not present

## 2017-02-22 DIAGNOSIS — E0800 Diabetes mellitus due to underlying condition with hyperosmolarity without nonketotic hyperglycemic-hyperosmolar coma (NKHHC): Secondary | ICD-10-CM | POA: Diagnosis not present

## 2017-02-22 LAB — ECHOCARDIOGRAM COMPLETE
CHL CUP DOP CALC LVOT VTI: 20.9 cm
CHL CUP REG VEL DIAS: 175 cm/s
CHL CUP RV SYS PRESS: 53 mmHg
CHL CUP TV REG PEAK VELOCITY: 354 cm/s
FS: 16 % — AB (ref 28–44)
Height: 68 in
IV/PV OW: 1
LA diam end sys: 48 mm
LA diam index: 2.3 cm/m2
LA vol A4C: 99.3 ml
LA vol: 86.5 mL
LASIZE: 48 mm
LAVOLIN: 41.4 mL/m2
LDCA: 3.8 cm2
LV PW d: 11 mm — AB (ref 0.6–1.1)
LV SIMPSON'S DISK: 43
LV sys vol index: 40 mL/m2
LV sys vol: 84 mL — AB (ref 21–61)
LVDIAVOL: 147 mL (ref 62–150)
LVDIAVOLIN: 70 mL/m2
LVOT peak grad rest: 3 mmHg
LVOTD: 22 mm
LVOTPV: 86.5 cm/s
LVOTSV: 79 mL
Lateral S' vel: 11.2 cm/s
MV pk A vel: 68.4 m/s
MV pk E vel: 95.1 m/s
MVPG: 4 mmHg
PV Reg grad dias: 12 mmHg
Stroke v: 63 ml
TAPSE: 15.7 mm
TR max vel: 354 cm/s
Weight: 3136 oz

## 2017-02-22 LAB — BASIC METABOLIC PANEL
Anion gap: 8 (ref 5–15)
BUN: 20 mg/dL (ref 6–20)
CHLORIDE: 106 mmol/L (ref 101–111)
CO2: 28 mmol/L (ref 22–32)
CREATININE: 1.74 mg/dL — AB (ref 0.61–1.24)
Calcium: 8.4 mg/dL — ABNORMAL LOW (ref 8.9–10.3)
GFR calc Af Amer: 40 mL/min — ABNORMAL LOW (ref >60)
GFR, EST NON AFRICAN AMERICAN: 35 mL/min — AB (ref >60)
GLUCOSE: 90 mg/dL (ref 65–99)
POTASSIUM: 2.9 mmol/L — AB (ref 3.5–5.1)
Sodium: 142 mmol/L (ref 135–145)

## 2017-02-22 LAB — FERRITIN: Ferritin: 10 ng/mL — ABNORMAL LOW (ref 24–336)

## 2017-02-22 LAB — IRON AND TIBC
IRON: 17 ug/dL — AB (ref 45–182)
Saturation Ratios: 6 % — ABNORMAL LOW (ref 17.9–39.5)
TIBC: 301 ug/dL (ref 250–450)
UIBC: 284 ug/dL

## 2017-02-22 LAB — CBC WITH DIFFERENTIAL/PLATELET
Basophils Absolute: 0 10*3/uL (ref 0.0–0.1)
Basophils Relative: 0 %
Eosinophils Absolute: 0.1 10*3/uL (ref 0.0–0.7)
Eosinophils Relative: 2 %
HEMATOCRIT: 27.6 % — AB (ref 39.0–52.0)
Hemoglobin: 8.8 g/dL — ABNORMAL LOW (ref 13.0–17.0)
LYMPHS ABS: 1.2 10*3/uL (ref 0.7–4.0)
LYMPHS PCT: 19 %
MCH: 26.7 pg (ref 26.0–34.0)
MCHC: 31.9 g/dL (ref 30.0–36.0)
MCV: 83.9 fL (ref 78.0–100.0)
MONO ABS: 0.5 10*3/uL (ref 0.1–1.0)
MONOS PCT: 9 %
NEUTROS ABS: 4.2 10*3/uL (ref 1.7–7.7)
Neutrophils Relative %: 70 %
Platelets: 133 10*3/uL — ABNORMAL LOW (ref 150–400)
RBC: 3.29 MIL/uL — ABNORMAL LOW (ref 4.22–5.81)
RDW: 17.2 % — AB (ref 11.5–15.5)
WBC: 6 10*3/uL (ref 4.0–10.5)

## 2017-02-22 LAB — PROTIME-INR
INR: 1.1
PROTHROMBIN TIME: 14.2 s (ref 11.4–15.2)

## 2017-02-22 LAB — TROPONIN I
Troponin I: 0.03 ng/mL (ref <0.03)
Troponin I: 0.03 ng/mL (ref <0.03)

## 2017-02-22 LAB — HEPATIC FUNCTION PANEL
ALT: 46 U/L (ref 17–63)
AST: 21 U/L (ref 15–41)
Albumin: 3.1 g/dL — ABNORMAL LOW (ref 3.5–5.0)
Alkaline Phosphatase: 54 U/L (ref 38–126)
BILIRUBIN DIRECT: 0.1 mg/dL (ref 0.1–0.5)
BILIRUBIN TOTAL: 0.7 mg/dL (ref 0.3–1.2)
Indirect Bilirubin: 0.6 mg/dL (ref 0.3–0.9)
Total Protein: 5.6 g/dL — ABNORMAL LOW (ref 6.5–8.1)

## 2017-02-22 LAB — GLUCOSE, CAPILLARY
GLUCOSE-CAPILLARY: 133 mg/dL — AB (ref 65–99)
GLUCOSE-CAPILLARY: 93 mg/dL (ref 65–99)
GLUCOSE-CAPILLARY: 97 mg/dL (ref 65–99)
Glucose-Capillary: 116 mg/dL — ABNORMAL HIGH (ref 65–99)

## 2017-02-22 LAB — VITAMIN B12: VITAMIN B 12: 2040 pg/mL — AB (ref 180–914)

## 2017-02-22 LAB — MRSA PCR SCREENING: MRSA by PCR: NEGATIVE

## 2017-02-22 LAB — BRAIN NATRIURETIC PEPTIDE: B Natriuretic Peptide: 1024.3 pg/mL — ABNORMAL HIGH (ref 0.0–100.0)

## 2017-02-22 MED ORDER — POTASSIUM CHLORIDE CRYS ER 20 MEQ PO TBCR
60.0000 meq | EXTENDED_RELEASE_TABLET | Freq: Three times a day (TID) | ORAL | Status: DC
  Administered 2017-02-22 (×3): 60 meq via ORAL
  Filled 2017-02-22 (×3): qty 3

## 2017-02-22 MED ORDER — ISOSORBIDE DINITRATE 10 MG PO TABS
30.0000 mg | ORAL_TABLET | Freq: Three times a day (TID) | ORAL | Status: DC
  Administered 2017-02-22 – 2017-02-24 (×6): 30 mg via ORAL
  Filled 2017-02-22 (×6): qty 3

## 2017-02-22 MED ORDER — FUROSEMIDE 10 MG/ML IJ SOLN
80.0000 mg | Freq: Three times a day (TID) | INTRAMUSCULAR | Status: DC
  Administered 2017-02-22 – 2017-02-24 (×6): 80 mg via INTRAVENOUS
  Filled 2017-02-22 (×6): qty 8

## 2017-02-22 MED ORDER — FUROSEMIDE 10 MG/ML IJ SOLN: 60.0000 mg | Freq: Two times a day (BID) | INTRAMUSCULAR | Status: DC

## 2017-02-22 MED ORDER — HYDRALAZINE HCL 50 MG PO TABS
50.0000 mg | ORAL_TABLET | Freq: Four times a day (QID) | ORAL | Status: DC
  Administered 2017-02-22 – 2017-02-24 (×9): 50 mg via ORAL
  Filled 2017-02-22 (×8): qty 1

## 2017-02-22 NOTE — Consult Note (Signed)
CARDIOLOGY CONSULT NOTE       Patient ID: Miciah Covelli MRN: [id number] DOB/AGE: 05/03/1933 83 y.o.  Admit date: 02/21/2017 Referring Physician: Allyson Sabal Primary Physician: Pcp Not In System Primary Cardiologist: [place] visiting  Reason for Consultation: Chest Pain and CHF  Principal Problem:   Acute exacerbation of CHF (congestive heart failure) ([place]) Active Problems:   Chest pain   HTN (hypertension)   AICD (automatic cardioverter/defibrillator) present   CAD (coronary artery disease)   Diabetes ([place])   HPI:  83 y.o. visiting family from [place]. Recent MI in [place]. Describes having stents. Had CHF and eventually received AICD. Has noted increasing LE edema and dyspnea last few days. SSCP wax/wanes not always exertional has not taken nitro for it. Pain as bad as 8/10 Pain free this am and troponins negative However BNP elevated and signs of acute systolic CHF. No fever, palpitations or syncope AICD has not d/c. Compliant with meds Has history of multiple myeloma in remission but some CRF. Prior to MI Rx for HTN . History of PUD but no GI bleeding. This am dyspnea sats 100% got iv lasix BP still somewhat elevated ECG with no acute changes     ROS All other systems reviewed and negative except as noted above  Past Medical History:  Diagnosis Date  . AICD (automatic cardioverter/defibrillator) present   . Cancer ([place])    mulitple myeloma  . CHF (congestive heart failure) ([place])   . Coronary artery disease   . Diabetes mellitus without complication ([place])   . Hypertension   . Myocardial infarction   . PUD (peptic ulcer disease)     Family History  Problem Relation Age of Onset  . Dementia Mother   . Cancer Father   . Prostate cancer Brother   . Prostate cancer Brother     Social History   Social History  . Marital status: Single    Spouse name: N/A  . Number of children: N/A  . Years of education: N/A   Occupational History  . Not on file.   Social History Main Topics  .  Smoking status: Never Smoker  . Smokeless tobacco: Never Used  . Alcohol use Yes     Comment: occasionally  . Drug use: No  . Sexual activity: Not on file   Other Topics Concern  . Not on file   Social History Narrative  . No narrative on file    Past Surgical History:  Procedure Laterality Date  . APPENDECTOMY    . BACK SURGERY    . PROSTATE SURGERY       . allopurinol  300 mg Oral Daily  . amiodarone  200 mg Oral BID  . aspirin EC  81 mg Oral Daily  . atorvastatin  40 mg Oral QHS  . carvedilol  12.5 mg Oral BID WC  . clopidogrel  75 mg Oral Daily  . furosemide  80 mg Intravenous Q8H  . hydrALAZINE  50 mg Oral Q6H  . insulin aspart  0-9 Units Subcutaneous TID WC  . isosorbide dinitrate  30 mg Oral TID  . latanoprost  1 drop Both Eyes QHS  . nitroGLYCERIN  0.5 inch Topical Q6H  . potassium chloride SA  60 mEq Oral TID  . tamsulosin  0.4 mg Oral Daily     Physical Exam: Blood pressure (!) 164/83, pulse 60, temperature 98.1 F (36.7 C), resp. rate 13, height 5' 8" (1.727 m), weight 196 lb (88.9 kg), SpO2 100 %.   Affect  appropriate Elderly black male  HEENT: normal Neck supple with no adenopathy JVP normal no bruits no thyromegaly Lungs bilateral basilar rales  and good diaphragmatic motion Heart:  S1/S2 no murmur, no rub, gallop or click PMI  Enlarged AICD under left clavilce  Abdomen: benighn, BS positve, no tenderness, no AAA no bruit.  No HSM or HJR Distal pulses intact with no bruits Plus one bilateral edema Neuro non-focal Skin warm and dry No muscular weakness   Labs:   Lab Results  Component Value Date   WBC 6.0 02/22/2017   HGB 8.8 (L) 02/22/2017   HCT 27.6 (L) 02/22/2017   MCV 83.9 02/22/2017   PLT 133 (L) 02/22/2017     Recent Labs Lab 02/22/17 0123 02/22/17 0423  NA 142  --   K 2.9*  --   CL 106  --   CO2 28  --   BUN 20  --   CREATININE 1.74*  --   CALCIUM 8.4*  --   PROT  --  5.6*  BILITOT  --  0.7  ALKPHOS  --  54  ALT   --  46  AST  --  21  GLUCOSE 90  --    Lab Results  Component Value Date   TROPONINI 0.03 ([place]) 02/22/2017   No results found for: CHOL No results found for: HDL No results found for: LDLCALC No results found for: TRIG No results found for: CHOLHDL No results found for: LDLDIRECT    Radiology: Dg Chest 2 View  Result Date: 02/21/2017 CLINICAL DATA:  Acute onset of shortness of breath and generalized chest pain. Nausea and headache. Initial encounter. EXAM: CHEST  2 VIEW COMPARISON:  Chest radiograph performed 06/01/2013 FINDINGS: The lungs are well-aerated. Vascular congestion is noted. Increased interstitial markings raise question for mild interstitial edema. There is no evidence of pleural effusion or pneumothorax. The heart is mildly enlarged. A pacemaker/AICD is noted at the left chest wall, with leads ending at the right atrium and right ventricle. No acute osseous abnormalities are seen. IMPRESSION: Vascular congestion and mild cardiomegaly. Increased interstitial markings raise question for mild interstitial edema. Electronically Signed   By: Garald Balding M.D.   On: 02/21/2017 20:25    EKG: SR PVC no acute ST changes or ST elevation  ASSESSMENT AND PLAN:   CHF:  Continue iv lasix. Have ordered echo suspect EF less than 35% since he has AICD. Increase hydralazine and nitrates If BP remains elevated Will need iv nitro.  CAD: Post stenting continue ASA/Plavix no signs of acute instent restenosis follow ECG and troponin AICD: no d.c would have industry interrogate in am Pocket is normal with no signs of infection Anemia:  hemoccult stools follow not clear what his baseline is CRF:  CR will rise with diuresis tolerate to Cr 2.0 ? Related to history of myeloma   Signed: Jenkins Rouge 02/22/2017, 8:38 AM

## 2017-02-22 NOTE — Progress Notes (Signed)
Floor tried to get records from [facility] and was informed they are unable to be obtained until tomorrow. Marcele Kosta PA-C

## 2017-02-22 NOTE — Plan of Care (Signed)
Problem: Cardiac: Goal: Ability to achieve and maintain adequate cardiopulmonary perfusion will improve Outcome: Progressing Adjust meds for control of HTN. Lasix IV

## 2017-02-22 NOTE — Progress Notes (Addendum)
[facility] PROGRESS NOTE  David Guerra DOB: June 10, 1933 DOA: 02/21/2017   PCP: Pcp Not In System     Assessment/Plan: Principal Problem:   Acute exacerbation of CHF (congestive heart failure) ([place]) Active Problems:   Chest pain   HTN (hypertension)   AICD (automatic cardioverter/defibrillator) present   CAD (coronary artery disease)   Diabetes (HCC)  David Guerra is a 84 y.o. gentleman with a history of CAD and recent MI in December 2017 S/P 2 stents, then AICD placement in January 2018, known CHF but he is not sure what type, Multiple Myeloma (in remission; no active treatment in 7 years), HTN, DM with CKD (last creatinine in this system was 4 years ago; 1.37 then; baseline unclear), and PUD who presents to the ED for evaluation of chest pain.  The patient describes a left sided chest pressure that waxes and wanes in intensity.He has had progressive lower extremity edema. Patient admitted for CHF exacerbation  Assessment and plan   Active chest pain with acute CHF exacerbation.  Type of CHF unknown and outside records are not available at this time.  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 Continue stepdown-telemetry shows normal sinus rhythm  --Continue baby aspirin and plavix daily --Continue Coreg, hydralazine, isosorbide dinitrate --Continue statin --Continue amiodarone --Diuresis with IV lasix-cards increased   lasix to 80 mg iv q 8 hrs  --Strict I/O --Daily weights --1500cc fluid restriction --Complete echo shows EF35-40%,hx of cardiac arrest in [facility]  --Serial troponin   CKD 2-3, renal baseline unclear.  Likely mild AKI in the setting of CHF exacerbation --Follow trend with diuresis  Diabetes --Hold glipizide --Glucose checks AC/HS, low dose SSI coverage since he has renal insufficiency  Presumed history of  gout --Allopurinol  Presumed BPH --Flomax  Hypokalemia --Replacement ordered in the ED then continue 78mq BID while being diuresed  Acute on chronic anemia, thrombocytopenia.   Baseline unknown.  Multiple myeloma reported to be in remission.  No active treatment at this time. --Anemia panel-shows iron deficiency --Monitor for transfusion requirement    DVT prophylaxsis heparin  Code Status:  Full code    Family Communication: Discussed in detail with the patient, all imaging results, lab results explained to the patient   Disposition Plan: Continue treatment for CHF        Consultants: Cardiology  Procedures: None  Antibiotics: Anti-infectives    None         HPI/Subjective: Chest pain free currently , still has sob at rest   Objective: Vitals:   02/22/17 0024 02/22/17 0502 02/22/17 0700 02/22/17 0723  BP: (!) 170/91 (!) 159/77 (!) 164/83   Pulse: (!) 59 60 (!) 40 (!) 59  Resp: 11 18 (!) 21 13  Temp: 97.6 F (36.4 C) 98.1 F (36.7 C)  98.1 F (36.7 C)  TempSrc: Oral     SpO2: (!) 84% 97% 100% 100%  Weight: 91.7 kg (202 lb 1.6 oz) 88.9 kg (196 lb)    Height: _0  (1.727 m)       Intake/Output Summary (Last 24 hours) at 02/22/17 0749 Last data filed at 02/22/17 0700  Gross per 24 hour  Intake              120 ml  Output             1550 ml  Net            -  1430 ml    Exam:  Examination:  General exam: Appears calm and comfortable  Respiratory system: Clear to auscultation. Respiratory effort normal. Cardiovascular system: S1 & S2 heard, RRR. No JVD, murmurs, rubs, gallops or clicks. No pedal edema. Gastrointestinal system: Abdomen is nondistended, soft and nontender. No organomegaly or masses felt. Normal bowel sounds heard. Central nervous system: Alert and oriented. No focal neurological deficits. Extremities: Symmetric 5 x 5 power. Skin: No rashes, lesions or ulcers Psychiatry: Judgement and insight appear normal. Mood & affect  appropriate.     Data Reviewed: I have personally reviewed following labs and imaging studies  Micro Results Recent Results (from the past 240 hour(s))  MRSA PCR Screening     Status: None   Collection Time: 02/22/17 12:27 AM  Result Value Ref Range Status   MRSA by PCR NEGATIVE NEGATIVE Final    Comment:        The GeneXpert MRSA Assay (FDA approved for NASAL specimens only), is one component of a comprehensive MRSA colonization surveillance program. It is not intended to diagnose MRSA infection nor to guide or monitor treatment for MRSA infections.     Radiology Reports Dg Chest 2 View  Result Date: 02/21/2017 CLINICAL DATA:  Acute onset of shortness of breath and generalized chest pain. Nausea and headache. Initial encounter. EXAM: CHEST  2 VIEW COMPARISON:  Chest radiograph performed 06/01/2013 FINDINGS: The lungs are well-aerated. Vascular congestion is noted. Increased interstitial markings raise question for mild interstitial edema. There is no evidence of pleural effusion or pneumothorax. The heart is mildly enlarged. A pacemaker/AICD is noted at the left chest wall, with leads ending at the right atrium and right ventricle. No acute osseous abnormalities are seen. IMPRESSION: Vascular congestion and mild cardiomegaly. Increased interstitial markings raise question for mild interstitial edema. Electronically Signed   By: Garald Balding M.D.   On: 02/21/2017 20:25     CBC  Recent Labs Lab 02/21/17 1926 02/22/17 0423  WBC 5.6 6.0  HGB 9.0* 8.8*  HCT 28.2* 27.6*  PLT 137* 133*  MCV 83.4 83.9  MCH 26.6 26.7  MCHC 31.9 31.9  RDW 17.4* 17.2*  LYMPHSABS  --  1.2  MONOABS  --  0.5  EOSABS  --  0.1  BASOSABS  --  0.0    Chemistries   Recent Labs Lab 02/21/17 1926 02/22/17 0123 02/22/17 0423  NA 140 142  --   K 3.1* 2.9*  --   CL 106 106  --   CO2 26 28  --   GLUCOSE 123* 90  --   BUN 23* 20  --   CREATININE 1.83* 1.74*  --   CALCIUM 8.5* 8.4*  --   AST   --   --  21  ALT  --   --  46  ALKPHOS  --   --  54  BILITOT  --   --  0.7   ------------------------------------------------------------------------------------------------------------------ estimated creatinine clearance is 35.5 mL/min (A) (by C-G formula based on SCr of 1.74 mg/dL (H)). ------------------------------------------------------------------------------------------------------------------ No results for input(s): HGBA1C in the last 72 hours. ------------------------------------------------------------------------------------------------------------------ No results for input(s): CHOL, HDL, LDLCALC, TRIG, CHOLHDL, LDLDIRECT in the last 72 hours. ------------------------------------------------------------------------------------------------------------------ No results for input(s): TSH, T4TOTAL, T3FREE, THYROIDAB in the last 72 hours.  Invalid input(s): FREET3 ------------------------------------------------------------------------------------------------------------------  Recent Labs  02/21/17 2255  VITAMINB12 2,040*  FOLATE 10.8  FERRITIN 10*  TIBC 301  IRON 17*  RETICCTPCT 1.2    Coagulation profile  Recent Labs Lab  02/22/17 0423  INR 1.10    No results for input(s): DDIMER in the last 72 hours.  Cardiac Enzymes  Recent Labs Lab 02/21/17 2255 02/22/17 0123 02/22/17 0423  TROPONINI 0.03* 0.03* 0.03*   ------------------------------------------------------------------------------------------------------------------ Invalid input(s): POCBNP   CBG:  Recent Labs Lab 02/22/17 0722  GLUCAP 116*       Studies: Dg Chest 2 View  Result Date: 02/21/2017 CLINICAL DATA:  Acute onset of shortness of breath and generalized chest pain. Nausea and headache. Initial encounter. EXAM: CHEST  2 VIEW COMPARISON:  Chest radiograph performed 06/01/2013 FINDINGS: The lungs are well-aerated. Vascular congestion is noted. Increased interstitial markings raise  question for mild interstitial edema. There is no evidence of pleural effusion or pneumothorax. The heart is mildly enlarged. A pacemaker/AICD is noted at the left chest wall, with leads ending at the right atrium and right ventricle. No acute osseous abnormalities are seen. IMPRESSION: Vascular congestion and mild cardiomegaly. Increased interstitial markings raise question for mild interstitial edema. Electronically Signed   By: Garald Balding M.D.   On: 02/21/2017 20:25      No results found for: HGBA1C Lab Results  Component Value Date   CREATININE 1.74 (H) 02/22/2017       Scheduled Meds: . allopurinol  300 mg Oral Daily  . amiodarone  200 mg Oral BID  . aspirin EC  81 mg Oral Daily  . atorvastatin  40 mg Oral QHS  . carvedilol  12.5 mg Oral BID WC  . clopidogrel  75 mg Oral Daily  . furosemide  60 mg Intravenous BID  . furosemide  60 mg Intravenous BID  . hydrALAZINE  50 mg Oral BID  . insulin aspart  0-9 Units Subcutaneous TID WC  . isosorbide dinitrate  20 mg Oral BID  . latanoprost  1 drop Both Eyes QHS  . nitroGLYCERIN  0.5 inch Topical Q6H  . potassium chloride SA  60 mEq Oral TID  . tamsulosin  0.4 mg Oral Daily   Continuous Infusions:   LOS: 0 days    Time spent: >30 MINS    [facility]  [facility] Pager [phone]. If 7PM-7AM, please contact night-coverage at [email or web address], password [facility] 02/22/2017, 7:49 AM  LOS: 0 days

## 2017-02-22 NOTE — Progress Notes (Signed)
  Echocardiogram 2D Echocardiogram has been performed.  Johny Chess 02/22/2017, 9:57 AM

## 2017-02-23 ENCOUNTER — Encounter (HOSPITAL_COMMUNITY): Payer: Self-pay | Admitting: Physician Assistant

## 2017-02-23 DIAGNOSIS — N183 Chronic kidney disease, stage 3 unspecified: Secondary | ICD-10-CM | POA: Diagnosis present

## 2017-02-23 DIAGNOSIS — I5043 Acute on chronic combined systolic (congestive) and diastolic (congestive) heart failure: Secondary | ICD-10-CM

## 2017-02-23 LAB — COMPREHENSIVE METABOLIC PANEL
ALBUMIN: 3.2 g/dL — AB (ref 3.5–5.0)
ALT: 45 U/L (ref 17–63)
AST: 19 U/L (ref 15–41)
Alkaline Phosphatase: 52 U/L (ref 38–126)
Anion gap: 8 (ref 5–15)
BILIRUBIN TOTAL: 0.6 mg/dL (ref 0.3–1.2)
BUN: 19 mg/dL (ref 6–20)
CHLORIDE: 107 mmol/L (ref 101–111)
CO2: 27 mmol/L (ref 22–32)
CREATININE: 1.8 mg/dL — AB (ref 0.61–1.24)
Calcium: 8.3 mg/dL — ABNORMAL LOW (ref 8.9–10.3)
GFR calc Af Amer: 39 mL/min — ABNORMAL LOW (ref >60)
GFR, EST NON AFRICAN AMERICAN: 33 mL/min — AB (ref >60)
GLUCOSE: 86 mg/dL (ref 65–99)
Potassium: 3.5 mmol/L (ref 3.5–5.1)
Sodium: 142 mmol/L (ref 135–145)
TOTAL PROTEIN: 6 g/dL — AB (ref 6.5–8.1)

## 2017-02-23 LAB — CBC
HCT: 27.9 % — ABNORMAL LOW (ref 39.0–52.0)
Hemoglobin: 8.9 g/dL — ABNORMAL LOW (ref 13.0–17.0)
MCH: 26.6 pg (ref 26.0–34.0)
MCHC: 31.9 g/dL (ref 30.0–36.0)
MCV: 83.5 fL (ref 78.0–100.0)
PLATELETS: 144 10*3/uL — AB (ref 150–400)
RBC: 3.34 MIL/uL — ABNORMAL LOW (ref 4.22–5.81)
RDW: 17.3 % — AB (ref 11.5–15.5)
WBC: 6.2 10*3/uL (ref 4.0–10.5)

## 2017-02-23 LAB — GLUCOSE, CAPILLARY
Glucose-Capillary: 89 mg/dL (ref 65–99)
Glucose-Capillary: 107 mg/dL — ABNORMAL HIGH (ref 65–99)
Glucose-Capillary: 118 mg/dL — ABNORMAL HIGH (ref 65–99)
Glucose-Capillary: 146 mg/dL — ABNORMAL HIGH (ref 65–99)

## 2017-02-23 MED ORDER — AMIODARONE HCL 200 MG PO TABS
200.0000 mg | ORAL_TABLET | Freq: Every day | ORAL | Status: DC
  Administered 2017-02-24: 200 mg via ORAL
  Filled 2017-02-23: qty 1

## 2017-02-23 MED ORDER — POTASSIUM CHLORIDE CRYS ER 20 MEQ PO TBCR
60.0000 meq | EXTENDED_RELEASE_TABLET | Freq: Three times a day (TID) | ORAL | Status: AC
  Administered 2017-02-23 (×3): 60 meq via ORAL
  Filled 2017-02-23 (×3): qty 3

## 2017-02-23 NOTE — Progress Notes (Signed)
   02/23/17 1000  Clinical Encounter Type  Visited With Patient  Visit Type Initial  Referral From Nurse  Consult/Referral To Chaplain  Recommendations (follow up as needed)  Spiritual Encounters  Spiritual Needs Literature;Brochure  Stress Factors  Patient Stress Factors None identified  Advance Directives (For Healthcare)  Does Patient Have a Medical Advance Directive? No  Would patient like information on creating a medical advance directive? Yes (Inpatient - patient defers creating a medical advance directive at this time)  Pt wants to wait speak or discuss with daughter later today, will call when or if ready to complete AD.  Will follow up as needed.  Chaplain Phillippa Straub A. Nahomi Hegner  [phone]

## 2017-02-23 NOTE — Progress Notes (Signed)
[facility] PROGRESS NOTE  David Guerra ULA:[id number] DOB: [birth year] DOA: 02/21/2017   PCP: Pcp Not In System     Assessment/Plan: Principal Problem:   Acute exacerbation of CHF (congestive heart failure) ([place]) Active Problems:   Chest pain   HTN (hypertension)   AICD (automatic cardioverter/defibrillator) present   CAD (coronary artery disease)   Diabetes (HCC)  David Guerra is a 83 y.o. gentleman with a history of CAD and recent MI in December 2017 S/P 2 stents, then AICD placement in January 2018, known CHF but he is not sure what type, Multiple Myeloma (in remission; no active treatment in 7 years), HTN, DM with CKD (last creatinine in this system was 4 years ago; 1.37 then; baseline unclear), and PUD who presents to the ED for evaluation of chest pain.  The patient describes a left sided chest pressure that waxes and wanes in intensity.He has had progressive lower extremity edema. Patient admitted for CHF exacerbation  Assessment and plan   Active chest pain with acute systolic CHF exacerbation.   outside records are not available at this time.  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) -states he had a cardiac arrest in January of this year Continue stepdown-telemetry shows normal sinus rhythm  --Continue baby aspirin and plavix daily --Continue Coreg, hydralazine, isosorbide dinitrate, amiodarone-medications adjusted as per cardiology recommendations --Continue statin --Continue amiodarone --Diuresis with IV lasix-cards increased   lasix to 80 mg iv q 8 hrs -creatinine stable --Strict I/O-2325 cc, in the last 24 hours --Daily weights- 202>193 pounds --1500cc fluid restriction hx of cardiac arrest in [facility]  --Serial troponin   CKD 2-3, renal baseline unclear.  Likely mild AKI in the setting of CHF exacerbation --Follow trend with diuresis  Diabetes-Accu-Chek stable --Hold glipizide --Glucose checks AC/HS, low dose SSI coverage  since he has renal insufficiency  Presumed history of gout --Allopurinol  Presumed BPH --Flomax  Hypokalemia --Replacement ordered in the ED then continue 39mq BID while being diuresed  Acute on chronic anemia, thrombocytopenia.   Baseline unknown.  Multiple myeloma reported to be in remission.  No active treatment at this time. --Anemia panel-shows iron deficiency --Monitor for transfusion requirement    DVT prophylaxsis heparin  Code Status:  Full code    Family Communication: Discussed in detail with the patient, all imaging results, lab results explained to the patient   Disposition Plan: Continue treatment for CHF        Consultants: Cardiology  Procedures: None  Antibiotics: Anti-infectives    None         HPI/Subjective: Patient somewhat lightheaded and noted to be bradycardic this morning  Objective: Vitals:   02/22/17 2337 02/23/17 0421 02/23/17 0516 02/23/17 0713  BP: (!) 171/82 (!) 161/78 (!) 157/78 (!) 152/79  Pulse: 60 60  62  Resp: _0 Temp: 98.4 F (36.9 C) 98.7 F (37.1 C)  98.7 F (37.1 C)  TempSrc:    Oral  SpO2: 100% 97%  100%  Weight:  87.9 kg (193 lb 12.8 oz)    Height:        Intake/Output Summary (Last 24 hours) at 02/23/17 0744 Last data filed at 02/23/17 0700  Gross per 24 hour  Intake              420 ml  Output             2475 ml  Net            -  2055 ml    Exam:  Examination:  General exam: Appears calm and comfortable  Respiratory system: Clear to auscultation. Respiratory effort normal. Cardiovascular system: S1 & S2 heard, RRR. No JVD, murmurs, rubs, gallops or clicks. No pedal edema. Gastrointestinal system: Abdomen is nondistended, soft and nontender. No organomegaly or masses felt. Normal bowel sounds heard. Central nervous system: Alert and oriented. No focal neurological deficits. Extremities: Symmetric 5 x 5 power. Skin: No rashes, lesions or ulcers Psychiatry: Judgement and insight  appear normal. Mood & affect appropriate.     Data Reviewed: I have personally reviewed following labs and imaging studies  Micro Results Recent Results (from the past 240 hour(s))  MRSA PCR Screening     Status: None   Collection Time: 02/22/17 12:27 AM  Result Value Ref Range Status   MRSA by PCR NEGATIVE NEGATIVE Final    Comment:        The GeneXpert MRSA Assay (FDA approved for NASAL specimens only), is one component of a comprehensive MRSA colonization surveillance program. It is not intended to diagnose MRSA infection nor to guide or monitor treatment for MRSA infections.     Radiology Reports Dg Chest 2 View  Result Date: 02/21/2017 CLINICAL DATA:  Acute onset of shortness of breath and generalized chest pain. Nausea and headache. Initial encounter. EXAM: CHEST  2 VIEW COMPARISON:  Chest radiograph performed 06/01/2013 FINDINGS: The lungs are well-aerated. Vascular congestion is noted. Increased interstitial markings raise question for mild interstitial edema. There is no evidence of pleural effusion or pneumothorax. The heart is mildly enlarged. A pacemaker/AICD is noted at the left chest wall, with leads ending at the right atrium and right ventricle. No acute osseous abnormalities are seen. IMPRESSION: Vascular congestion and mild cardiomegaly. Increased interstitial markings raise question for mild interstitial edema. Electronically Signed   By: Garald Balding M.D.   On: 02/21/2017 20:25     CBC  Recent Labs Lab 02/21/17 1926 02/22/17 0423 02/23/17 0249  WBC 5.6 6.0 6.2  HGB 9.0* 8.8* 8.9*  HCT 28.2* 27.6* 27.9*  PLT 137* 133* 144*  MCV 83.4 83.9 83.5  MCH 26.6 26.7 26.6  MCHC 31.9 31.9 31.9  RDW 17.4* 17.2* 17.3*  LYMPHSABS  --  1.2  --   MONOABS  --  0.5  --   EOSABS  --  0.1  --   BASOSABS  --  0.0  --     Chemistries   Recent Labs Lab 02/21/17 1926 02/22/17 0123 02/22/17 0423 02/23/17 0249  NA 140 142  --  142  K 3.1* 2.9*  --  3.5  CL 106  106  --  107  CO2 26 28  --  27  GLUCOSE 123* 90  --  86  BUN 23* 20  --  19  CREATININE 1.83* 1.74*  --  1.80*  CALCIUM 8.5* 8.4*  --  8.3*  AST  --   --  21 19  ALT  --   --  46 45  ALKPHOS  --   --  54 52  BILITOT  --   --  0.7 0.6   ------------------------------------------------------------------------------------------------------------------ estimated creatinine clearance is 34.1 mL/min (A) (by C-G formula based on SCr of 1.8 mg/dL (H)). ------------------------------------------------------------------------------------------------------------------ No results for input(s): HGBA1C in the last 72 hours. ------------------------------------------------------------------------------------------------------------------ No results for input(s): CHOL, HDL, LDLCALC, TRIG, CHOLHDL, LDLDIRECT in the last 72 hours. ------------------------------------------------------------------------------------------------------------------ No results for input(s): TSH, T4TOTAL, T3FREE, THYROIDAB in the last 72 hours.  Invalid  input(s): FREET3 ------------------------------------------------------------------------------------------------------------------  Recent Labs  02/21/17 2255  VITAMINB12 2,040*  FOLATE 10.8  FERRITIN 10*  TIBC 301  IRON 17*  RETICCTPCT 1.2    Coagulation profile  Recent Labs Lab 02/22/17 0423  INR 1.10    No results for input(s): DDIMER in the last 72 hours.  Cardiac Enzymes  Recent Labs Lab 02/21/17 2255 02/22/17 0123 02/22/17 0423  TROPONINI 0.03* 0.03* 0.03*   ------------------------------------------------------------------------------------------------------------------ Invalid input(s): POCBNP   CBG:  Recent Labs Lab 02/22/17 0722 02/22/17 1107 02/22/17 1620 02/22/17 2138 02/23/17 0711  GLUCAP 116* 133* 93 97 89       Studies: Dg Chest 2 View  Result Date: 02/21/2017 CLINICAL DATA:  Acute onset of shortness of breath and  generalized chest pain. Nausea and headache. Initial encounter. EXAM: CHEST  2 VIEW COMPARISON:  Chest radiograph performed 06/01/2013 FINDINGS: The lungs are well-aerated. Vascular congestion is noted. Increased interstitial markings raise question for mild interstitial edema. There is no evidence of pleural effusion or pneumothorax. The heart is mildly enlarged. A pacemaker/AICD is noted at the left chest wall, with leads ending at the right atrium and right ventricle. No acute osseous abnormalities are seen. IMPRESSION: Vascular congestion and mild cardiomegaly. Increased interstitial markings raise question for mild interstitial edema. Electronically Signed   By: Garald Balding M.D.   On: 02/21/2017 20:25      No results found for: HGBA1C Lab Results  Component Value Date   CREATININE 1.80 (H) 02/23/2017       Scheduled Meds: . allopurinol  300 mg Oral Daily  . amiodarone  200 mg Oral BID  . aspirin EC  81 mg Oral Daily  . atorvastatin  40 mg Oral QHS  . carvedilol  12.5 mg Oral BID WC  . clopidogrel  75 mg Oral Daily  . furosemide  80 mg Intravenous Q8H  . hydrALAZINE  50 mg Oral Q6H  . insulin aspart  0-9 Units Subcutaneous TID WC  . isosorbide dinitrate  30 mg Oral TID  . latanoprost  1 drop Both Eyes QHS  . nitroGLYCERIN  0.5 inch Topical Q6H  . potassium chloride SA  60 mEq Oral TID  . tamsulosin  0.4 mg Oral Daily   Continuous Infusions:   LOS: 1 day    Time spent: >30 MINS    [facility]  [facility] Pager [phone]. If 7PM-7AM, please contact night-coverage at [email or web address], password [facility] 02/23/2017, 7:44 AM  LOS: 1 day

## 2017-02-23 NOTE — Plan of Care (Signed)
Problem: Safety: Goal: Ability to remain free from injury will improve Outcome: Progressing Patient uses either the white phone or the call light to call for assistance before getting OOB and has his personal items within his reach on the bedside stand.

## 2017-02-23 NOTE — Progress Notes (Signed)
Report received in patient's room via Marya Amsler RN using SBAR format, reviewed orders, labs, VS, meds and pt's general condition, assumed care of patient.

## 2017-02-23 NOTE — Plan of Care (Signed)
Problem: Safety: Goal: Ability to remain free from injury will improve Outcome: Completed/Met Date Met: 02/23/17 Patient uses call light appropriately and is okay with bed alarm being on at night but never sets it off. Pt's personal items within reach on his bedside table right by his bed.

## 2017-02-23 NOTE — Progress Notes (Signed)
Progress Note  Patient Name: David Guerra Date of Encounter: 02/23/2017  Primary Cardiologist: Dr Johnsie Cancel here, lives in [place]  Patient Profile     83 y.o. male w/ hx M 12/2017I>>stents, CHF, AICD, MML, DM, HTN, HLD, visiting here from [place], developed worsening SOB and was admitted 03/31 w/ CHF exacerbation, CP.   Subjective   Breathing better, still needs O2. Was not breathing that well in [place], could only walk about 50 feet after his MI. Was tracking his weight, it was 186 lbs when he left. Has been in [place] x 3 weeks, admits to dietary indiscretions since being down here. Was not adding salt to foods but was eating a lot of prepared foods.   Inpatient Medications    Scheduled Meds: . allopurinol  300 mg Oral Daily  . amiodarone  200 mg Oral BID  . aspirin EC  81 mg Oral Daily  . atorvastatin  40 mg Oral QHS  . carvedilol  12.5 mg Oral BID WC  . clopidogrel  75 mg Oral Daily  . furosemide  80 mg Intravenous Q8H  . hydrALAZINE  50 mg Oral Q6H  . insulin aspart  0-9 Units Subcutaneous TID WC  . isosorbide dinitrate  30 mg Oral TID  . latanoprost  1 drop Both Eyes QHS  . nitroGLYCERIN  0.5 inch Topical Q6H  . potassium chloride SA  60 mEq Oral TID  . tamsulosin  0.4 mg Oral Daily   Continuous Infusions:  PRN Meds: acetaminophen, morphine injection, ondansetron (ZOFRAN) IV   Vital Signs    Vitals:   02/22/17 2337 02/23/17 0421 02/23/17 0516 02/23/17 0713  BP: (!) 171/82 (!) 161/78 (!) 157/78 (!) 152/79  Pulse: 60 60  62  Resp: 15 15  16   Temp: 98.4 F (36.9 C) 98.7 F (37.1 C)  98.7 F (37.1 C)  TempSrc:    Oral  SpO2: 100% 97%  100%  Weight:  193 lb 12.8 oz (87.9 kg)    Height:        Intake/Output Summary (Last 24 hours) at 02/23/17 0831 Last data filed at 02/23/17 5361  Gross per 24 hour  Intake              420 ml  Output             2575 ml  Net            -2155 ml   Filed Weights   02/22/17 0024 02/22/17 0502 02/23/17 0421  Weight: 202 lb 1.6 oz (91.7 kg)  196 lb (88.9 kg) 193 lb 12.8 oz (87.9 kg)    Telemetry    SR, PVCs and pairs, not rapid - Personally Reviewed  ECG    n/a - Personally Reviewed  Physical Exam   General: Well developed, well nourished, male appearing in no acute distress. Head: Normocephalic, atraumatic.  Neck: Supple without bruits, JVD 10 cm. Lungs:  Resp regular and unlabored, . Heart: RRR, S1, S2, no S3, S4, brief murmur; no rub. Abdomen: Soft, non-tender, non-distended with normoactive bowel sounds. No hepatomegaly. No rebound/guarding. No obvious abdominal masses. Extremities: No clubbing, cyanosis, no edema. Distal pedal pulses are 2+ bilaterally. Neuro: Alert and oriented X 3. Moves all extremities spontaneously. Psych: Normal affect.  Labs    Hematology  Recent Labs Lab 02/21/17 1926 02/21/17 2255 02/22/17 0423 02/23/17 0249  WBC 5.6  --  6.0 6.2  RBC 3.38* 3.18* 3.29* 3.34*  HGB 9.0*  --  8.8* 8.9*  HCT 28.2*  --  27.6* 27.9*  MCV 83.4  --  83.9 83.5  MCH 26.6  --  26.7 26.6  MCHC 31.9  --  31.9 31.9  RDW 17.4*  --  17.2* 17.3*  PLT 137*  --  133* 144*    Chemistry  Recent Labs Lab 02/21/17 1926 02/22/17 0123 02/22/17 0423 02/23/17 0249  NA 140 142  --  142  K 3.1* 2.9*  --  3.5  CL 106 106  --  107  CO2 26 28  --  27  GLUCOSE 123* 90  --  86  BUN 23* 20  --  19  CREATININE 1.83* 1.74*  --  1.80*  CALCIUM 8.5* 8.4*  --  8.3*  PROT  --   --  5.6* 6.0*  ALBUMIN  --   --  3.1* 3.2*  AST  --   --  21 19  ALT  --   --  46 45  ALKPHOS  --   --  54 52  BILITOT  --   --  0.7 0.6  GFRNONAA 33* 35*  --  33*  GFRAA 38* 40*  --  39*  ANIONGAP 8 8  --  8     Cardiac Enzymes  Recent Labs Lab 02/21/17 2255 02/22/17 0123 02/22/17 0423  TROPONINI 0.03* 0.03* 0.03*     Recent Labs Lab 02/21/17 1937 02/21/17 2231  TROPIPOC 0.04 0.03     BNP  Recent Labs Lab 02/21/17 2027 02/22/17 0423  BNP 941.9* 1,024.3*     Radiology    Dg Chest 2 View Result Date:  02/21/2017 CLINICAL DATA:  Acute onset of shortness of breath and generalized chest pain. Nausea and headache. Initial encounter. EXAM: CHEST  2 VIEW COMPARISON:  Chest radiograph performed 06/01/2013 FINDINGS: The lungs are well-aerated. Vascular congestion is noted. Increased interstitial markings raise question for mild interstitial edema. There is no evidence of pleural effusion or pneumothorax. The heart is mildly enlarged. A pacemaker/AICD is noted at the left chest wall, with leads ending at the right atrium and right ventricle. No acute osseous abnormalities are seen. IMPRESSION: Vascular congestion and mild cardiomegaly. Increased interstitial markings raise question for mild interstitial edema. Electronically Signed   By: Garald Balding M.D.   On: 02/21/2017 20:25     Cardiac Studies   ECHO: 02/22/2017 - Left ventricle: Inferior wall hypokinesis The cavity size was   mildly dilated. Wall thickness was normal. Systolic function was   moderately reduced. The estimated ejection fraction was in the   range of 35% to 40%. - Mitral valve: There was mild regurgitation. - Left atrium: The atrium was moderately dilated. - Right ventricle: The cavity size was mildly dilated. - Atrial septum: No defect or patent foramen ovale was identified. - Pulmonary arteries: PA peak pressure: 53 mm Hg (S). - Pericardium, extracardiac: A trivial pericardial effusion was   identified.  Patient Profile     83 y.o. male w/ hx M 12/2017I>>stents, CHF, AICD, MML, DM, HTN, HLD, visiting here from [place], developed worsening SOB and was admitted 03/31 w/ CHF exacerbation, CP.   Assessment & Plan     Principal Problem:   Acute on chronic systolic and diastolic heart failure, NYHA class 4 (HCC) - continue diuresis, do not think pt at dry weight in [place] when wt was 186 but is still above that. Follow - no change in Lasix dose for now  Active Problems:   Chest pain - ez neg  MI, no further eval planned    HTN  (hypertension) - per IM    AICD (automatic cardioverter/defibrillator) present - RN to get info from [place] on device    CAD (coronary artery disease) w/ ICM - see above - pt is on high-dose statin, ASA, Plavix, Coreg  Otherwise, per IM   Diabetes ([place])   CKD III    Signed, Lenoard Aden 8:31 AM 02/23/2017 Pager: [phone] As above, patient seen and examined. His dyspnea is improving. No chest pain. Enzymes are negative. Plan to continue present dose of Lasix today. We can likely transition to oral Lasix tomorrow. His ejection fraction is 35-40%. I presume he is not on an ACE inhibitor because of baseline renal insufficiency although we do not have records from [place]. Continue hydralazine and nitrates. Continue carvedilol. Follow renal function. Hopefully can discharge in the next 24-48 hours. He will need close follow-up in [place]. We will have her device interrogated. I will decrease amiodarone to 200 mg daily. Discontinue Nitropaste as he is on Isordil.   Kirk Ruths, MD

## 2017-02-24 DIAGNOSIS — E0800 Diabetes mellitus due to underlying condition with hyperosmolarity without nonketotic hyperglycemic-hyperosmolar coma (NKHHC): Secondary | ICD-10-CM

## 2017-02-24 LAB — BASIC METABOLIC PANEL
ANION GAP: 10 (ref 5–15)
BUN: 19 mg/dL (ref 6–20)
CALCIUM: 8.5 mg/dL — AB (ref 8.9–10.3)
CO2: 28 mmol/L (ref 22–32)
CREATININE: 1.91 mg/dL — AB (ref 0.61–1.24)
Chloride: 105 mmol/L (ref 101–111)
GFR, EST AFRICAN AMERICAN: 36 mL/min — AB (ref >60)
GFR, EST NON AFRICAN AMERICAN: 31 mL/min — AB (ref >60)
GLUCOSE: 90 mg/dL (ref 65–99)
Potassium: 3.7 mmol/L (ref 3.5–5.1)
Sodium: 143 mmol/L (ref 135–145)

## 2017-02-24 LAB — GLUCOSE, CAPILLARY
GLUCOSE-CAPILLARY: 99 mg/dL (ref 65–99)
Glucose-Capillary: 92 mg/dL (ref 65–99)

## 2017-02-24 MED ORDER — FUROSEMIDE 20 MG PO TABS: 60.0000 mg | ORAL_TABLET | Freq: Two times a day (BID) | ORAL | 1 refills | Status: DC

## 2017-02-24 MED ORDER — FUROSEMIDE 40 MG PO TABS
60.0000 mg | ORAL_TABLET | Freq: Two times a day (BID) | ORAL | Status: DC
  Administered 2017-02-24: 60 mg via ORAL
  Filled 2017-02-24: qty 1

## 2017-02-24 MED ORDER — HYDRALAZINE HCL 50 MG PO TABS: 50.0000 mg | ORAL_TABLET | Freq: Four times a day (QID) | ORAL | 1 refills | Status: DC

## 2017-02-24 MED ORDER — ISOSORBIDE DINITRATE 30 MG PO TABS: 30.0000 mg | ORAL_TABLET | Freq: Three times a day (TID) | ORAL | 0 refills | Status: DC

## 2017-02-24 MED ORDER — AMIODARONE HCL 200 MG PO TABS: 200.0000 mg | ORAL_TABLET | Freq: Every day | ORAL | 0 refills | Status: DC

## 2017-02-24 MED ORDER — POTASSIUM CHLORIDE CRYS ER 20 MEQ PO TBCR
20.0000 meq | EXTENDED_RELEASE_TABLET | Freq: Two times a day (BID) | ORAL | Status: DC
  Administered 2017-02-24: 20 meq via ORAL
  Filled 2017-02-24: qty 1

## 2017-02-24 MED ORDER — GLIPIZIDE 5 MG PO TABS: 2.5000 mg | ORAL_TABLET | Freq: Every day | ORAL | 1 refills | Status: DC

## 2017-02-24 NOTE — Care Management Note (Addendum)
Case Management Note  Patient Details  Name: Edin Kon MRN: [id number] Date of Birth: [birth year]  Subjective/Objective:    CHF, chest pain                Action/Plan: Discharge Planning: AVS reviewed:  NCM spoke to pt and lives in [place]. Visiting family. Has cane when he goes out. [facility] [place]. [facility] and [facility]. Oxygen is not needed.   PCP Izora Gala MD [phone], fax [phone]    Expected Discharge Date:  02/24/17               Expected Discharge Plan:  Home/Self Care  In-House Referral:  NA  Discharge planning Services  CM Consult  Post Acute Care Choice:  NA Choice offered to:  NA  DME Arranged:  N/A DME Agency:  NA  HH Arranged:  NA HH Agency:  NA  Status of Service:  Completed, signed off  If discussed at [place] of Stay Meetings, dates discussed:    Additional Comments:  Erenest Rasher, RN 02/24/2017, 12:47 PM

## 2017-02-24 NOTE — Discharge Summary (Addendum)
Physician Discharge Summary  David Guerra MRN: [id number] DOB/AGE: 07/24/1933 83 y.o.  PCP: Pcp Not In System   Admit date: 02/21/2017 Discharge date: 02/24/2017  Discharge Diagnoses:    Principal Problem:   Acute on chronic systolic and diastolic heart failure, NYHA class 4 (HCC) Active Problems:   Chest pain   HTN (hypertension)   AICD (automatic cardioverter/defibrillator) present   CAD (coronary artery disease)   Diabetes ([place])   CKD (chronic kidney disease), stage III    Follow-up recommendations Follow-up with PCP in 3-5 days , including all  additional recommended appointments as below Follow-up CBC, CMP in 3-5 days He will need close follow-up with his cardiologist in [place]. I would recommend checking his potassium and renal function in 1 week      Current Discharge Medication List    CONTINUE these medications which have CHANGED   Details  amiodarone (PACERONE) 200 MG tablet Take 1 tablet (200 mg total) by mouth daily. Qty: 30 tablet, Refills: 0    furosemide (LASIX) 20 MG tablet Take 3 tablets (60 mg total) by mouth 2 (two) times daily. Qty: 60 tablet, Refills: 1    hydrALAZINE (APRESOLINE) 50 MG tablet Take 1 tablet (50 mg total) by mouth every 6 (six) hours. Qty: 120 tablet, Refills: 1    isosorbide dinitrate (ISORDIL) 30 MG tablet Take 1 tablet (30 mg total) by mouth 3 (three) times daily. Qty: 90 tablet, Refills: 0      CONTINUE these medications which have NOT CHANGED   Details  allopurinol (ZYLOPRIM) 300 MG tablet Take 300 mg by mouth daily. For gout    aspirin EC 81 MG tablet Take 81 mg by mouth daily. For heart    atorvastatin (LIPITOR) 80 MG tablet Take 40 mg by mouth at bedtime. For lowering cholesterol    carvedilol (COREG) 12.5 MG tablet Take 12.5 mg by mouth every 12 (twelve) hours. For heart    clopidogrel (PLAVIX) 75 MG tablet Take 75 mg by mouth daily. For blood thinning    fluticasone (FLONASE) 50 MCG/ACT nasal spray Place 1  spray into both nostrils at bedtime.    glipiZIDE (GLUCOTROL) 5 MG tablet Take 2.5  mg by mouth daily. For diabetes    latanoprost (XALATAN) 0.005 % ophthalmic solution Place 1 drop into both eyes at bedtime.    potassium chloride SA (K-DUR,KLOR-CON) 20 MEQ tablet Take 20 mEq by mouth 2 (two) times daily.    tamsulosin (FLOMAX) 0.4 MG CAPS capsule Take 0.4 mg by mouth daily.    vitamin B-12 (CYANOCOBALAMIN) 1000 MCG tablet Take 1,000 mcg by mouth daily.         Discharge Condition: Stable   Discharge Instructions Get Medicines reviewed and adjusted: Please take all your medications with you for your next visit with your Primary MD  Please request your Primary MD to go over all hospital tests and procedure/radiological results at the follow up, please ask your Primary MD to get all Hospital records sent to his/her office.  If you experience worsening of your admission symptoms, develop shortness of breath, life threatening emergency, suicidal or homicidal thoughts you must seek medical attention immediately by calling 911 or calling your MD immediately if symptoms less severe.  You must read complete instructions/literature along with all the possible adverse reactions/side effects for all the Medicines you take and that have been prescribed to you. Take any new Medicines after you have completely understood and accpet all the possible adverse reactions/side effects.  Do not drive when taking Pain medications.   Do not take more than prescribed Pain, Sleep and Anxiety Medications  Special Instructions: If you have smoked or chewed Tobacco in the last 2 yrs please stop smoking, stop any regular Alcohol and or any Recreational drug use.  Wear Seat belts while driving.  Please note  You were cared for by a hospitalist during your hospital stay. Once you are discharged, your primary care physician will handle any further medical issues. Please note that NO REFILLS for any  discharge medications will be authorized once you are discharged, as it is imperative that you return to your primary care physician (or establish a relationship with a primary care physician if you do not have one) for your aftercare needs so that they can reassess your need for medications and monitor your lab values.  Discharge Instructions    Diet - low sodium heart healthy    Complete by:  As directed    Increase activity slowly    Complete by:  As directed        No Known Allergies    Disposition: 01-Home or Self Care   Consults:  Cardiology    Significant Diagnostic Studies:  Dg Chest 2 View  Result Date: 02/21/2017 CLINICAL DATA:  Acute onset of shortness of breath and generalized chest pain. Nausea and headache. Initial encounter. EXAM: CHEST  2 VIEW COMPARISON:  Chest radiograph performed 06/01/2013 FINDINGS: The lungs are well-aerated. Vascular congestion is noted. Increased interstitial markings raise question for mild interstitial edema. There is no evidence of pleural effusion or pneumothorax. The heart is mildly enlarged. A pacemaker/AICD is noted at the left chest wall, with leads ending at the right atrium and right ventricle. No acute osseous abnormalities are seen. IMPRESSION: Vascular congestion and mild cardiomegaly. Increased interstitial markings raise question for mild interstitial edema. Electronically Signed   By: Garald Balding M.D.   On: 02/21/2017 20:25    echocardiogram  LV EF: 35% -   40%  ------------------------------------------------------------------- Indications:      CHF - 428.0.  ------------------------------------------------------------------- History:   PMH:   Angina pectoris.  Coronary artery disease.  Risk factors:  ICD in place. Lower extremity edema. Hypertension. Diabetes mellitus.  ------------------------------------------------------------------- Study Conclusions  - Left ventricle: Inferior wall hypokinesis The cavity  size was   mildly dilated. Wall thickness was normal. Systolic function was   moderately reduced. The estimated ejection fraction was in the   range of 35% to 40%. - Mitral valve: There was mild regurgitation. - Left atrium: The atrium was moderately dilated. - Right ventricle: The cavity size was mildly dilated. - Atrial septum: No defect or patent foramen ovale was identified. - Pulmonary arteries: PA peak pressure: 53 mm Hg (S). - Pericardium, extracardiac: A trivial pericardial effusion was   identified.       Filed Weights   02/22/17 0502 02/23/17 0421 02/24/17 0300  Weight: 88.9 kg (196 lb) 87.9 kg (193 lb 12.8 oz) 87.2 kg (192 lb 3.2 oz)     Microbiology: Recent Results (from the past 240 hour(s))  MRSA PCR Screening     Status: None   Collection Time: 02/22/17 12:27 AM  Result Value Ref Range Status   MRSA by PCR NEGATIVE NEGATIVE Final    Comment:        The GeneXpert MRSA Assay (FDA approved for NASAL specimens only), is one component of a comprehensive MRSA colonization surveillance program. It is not intended to diagnose MRSA infection nor  to guide or monitor treatment for MRSA infections.        Blood Culture No results found for: SDES, [place], CULT, REPTSTATUS    Labs: Results for orders placed or performed during the hospital encounter of 02/21/17 (from the past 48 hour(s))  Glucose, capillary     Status: None   Collection Time: 02/22/17  4:20 PM  Result Value Ref Range   Glucose-Capillary 93 65 - 99 mg/dL  Glucose, capillary     Status: None   Collection Time: 02/22/17  9:38 PM  Result Value Ref Range   Glucose-Capillary 97 65 - 99 mg/dL  Comprehensive metabolic panel     Status: Abnormal   Collection Time: 02/23/17  2:49 AM  Result Value Ref Range   Sodium 142 135 - 145 mmol/L   Potassium 3.5 3.5 - 5.1 mmol/L    Comment: DELTA CHECK NOTED   Chloride 107 101 - 111 mmol/L   CO2 27 22 - 32 mmol/L   Glucose, Bld 86 65 - 99 mg/dL    BUN 19 6 - 20 mg/dL   Creatinine, Ser 1.80 (H) 0.61 - 1.24 mg/dL   Calcium 8.3 (L) 8.9 - 10.3 mg/dL   Total Protein 6.0 (L) 6.5 - 8.1 g/dL   Albumin 3.2 (L) 3.5 - 5.0 g/dL   AST 19 15 - 41 U/L   ALT 45 17 - 63 U/L   Alkaline Phosphatase 52 38 - 126 U/L   Total Bilirubin 0.6 0.3 - 1.2 mg/dL   GFR calc non Af Amer 33 (L) >60 mL/min   GFR calc Af Amer 39 (L) >60 mL/min    Comment: (NOTE) The eGFR has been calculated using the CKD EPI equation. This calculation has not been validated in all clinical situations. eGFR's persistently <60 mL/min signify possible Chronic Kidney Disease.    Anion gap 8 5 - 15  CBC     Status: Abnormal   Collection Time: 02/23/17  2:49 AM  Result Value Ref Range   WBC 6.2 4.0 - 10.5 K/uL   RBC 3.34 (L) 4.22 - 5.81 MIL/uL   Hemoglobin 8.9 (L) 13.0 - 17.0 g/dL   HCT 27.9 (L) 39.0 - 52.0 %   MCV 83.5 78.0 - 100.0 fL   MCH 26.6 26.0 - 34.0 pg   MCHC 31.9 30.0 - 36.0 g/dL   RDW 17.3 (H) 11.5 - 15.5 %   Platelets 144 (L) 150 - 400 K/uL  Glucose, capillary     Status: None   Collection Time: 02/23/17  7:11 AM  Result Value Ref Range   Glucose-Capillary 89 65 - 99 mg/dL  Glucose, capillary     Status: Abnormal   Collection Time: 02/23/17 10:55 AM  Result Value Ref Range   Glucose-Capillary 107 (H) 65 - 99 mg/dL  Glucose, capillary     Status: Abnormal   Collection Time: 02/23/17  4:04 PM  Result Value Ref Range   Glucose-Capillary 118 (H) 65 - 99 mg/dL  Glucose, capillary     Status: Abnormal   Collection Time: 02/23/17  7:39 PM  Result Value Ref Range   Glucose-Capillary 146 (H) 65 - 99 mg/dL  Basic metabolic panel     Status: Abnormal   Collection Time: 02/24/17  3:46 AM  Result Value Ref Range   Sodium 143 135 - 145 mmol/L   Potassium 3.7 3.5 - 5.1 mmol/L   Chloride 105 101 - 111 mmol/L   CO2 28 22 - 32 mmol/L   Glucose,  Bld 90 65 - 99 mg/dL   BUN 19 6 - 20 mg/dL   Creatinine, Ser 1.91 (H) 0.61 - 1.24 mg/dL   Calcium 8.5 (L) 8.9 - 10.3 mg/dL    GFR calc non Af Amer 31 (L) >60 mL/min   GFR calc Af Amer 36 (L) >60 mL/min    Comment: (NOTE) The eGFR has been calculated using the CKD EPI equation. This calculation has not been validated in all clinical situations. eGFR's persistently <60 mL/min signify possible Chronic Kidney Disease.    Anion gap 10 5 - 15  Glucose, capillary     Status: None   Collection Time: 02/24/17  7:31 AM  Result Value Ref Range   Glucose-Capillary 92 65 - 99 mg/dL  Glucose, capillary     Status: None   Collection Time: 02/24/17 11:11 AM  Result Value Ref Range   Glucose-Capillary 99 65 - 99 mg/dL     Lipid Panel  No results found for: CHOL, TRIG, HDL, CHOLHDL, VLDL, LDLCALC, LDLDIRECT   No results found for: HGBA1C   Lab Results  Component Value Date   CREATININE 1.91 (H) 02/24/2017     HPI :*  David Bellamyis a 83 y.o.gentleman with a history of CAD and recent MI in December 2017 S/P 2 stents, then AICD placement in January 2018, known CHF but he is not sure what type, Multiple Myeloma (in remission; no active treatment in 7 years), HTN, DM with CKD (last creatinine in this system was 4 years ago; 1.37 then; baseline unclear), and PUD who presents to the ED for evaluation of chest pain. The patient describes a left sided chest pressure that waxes and wanes in intensity.He has had progressive lower extremity edema. Patient admitted for CHF exacerbation   HOSPITAL COURSE:    Active chest pain with acute systolic CHF exacerbation.   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) -states he had a cardiac arrest in January of this year Continue telemetry shows normal sinus rhythm  --Continue baby aspirin and plavix daily --Continue Coreg, hydralazine, isosorbide dinitrate, amiodarone-medications adjusted as per cardiology recommendations --Continue statin --Diuresis with IV lasix-cards increased   lasix to 80 mg iv q 8 hrs -creatinine stable volume status has improved. Change Lasix to 60  mg twice a day. Patient instructed on low sodium diet and fluid restriction. He will need close follow-up with his cardiologist in [place]. I would recommend checking his potassium and renal function in 1 week --Daily weights- [phone] pounds -- recommended 1500cc fluid restriction      CKD 2-3, renal baseline unclear. Likely mild AKI in the setting of CHF exacerbation Creatinine has remained stable with 3 days of diuresis  Diabetes-Accu-Chek stable Resume  glipizide at home dose, Accu-Chek borderline     Presumed history of gout --Allopurinol  Presumed BPH --Flomax  Hypokalemia --Replacement ordered in the ED then continue 70mq BID while being diuresed  Acute on chronic anemia, thrombocytopenia. Baseline unknown. Multiple myeloma reported to be in remission. No active treatment at this time. --Anemia panel-shows iron deficiency --Monitor for transfusion requirement     Discharge Exam:   Blood pressure 136/72, pulse (!) 43, temperature 98.3 F (36.8 C), temperature source Oral, resp. rate 19, height _0  (1.727 m), weight 87.2 kg (192 lb 3.2 oz), SpO2 98 %.   General: Well developed, well nourished, male appearing in no acute distress. Head: Normal Neck: Supple Lungs:  CTA Heart: RRR Abdomen: Soft, non-tender, non-distended Extremities: No edema. Neuro: Alert and  oriented X 3. Moves all extremities spontaneously.      SignedReyne Dumas 02/24/2017, 11:37 AM        Time spent >45 mins

## 2017-02-24 NOTE — Progress Notes (Signed)
Discharge instructions, RX's and follow up appts explained and provided to patient and daughter verbalized understanding. Patient left floor via wheelchair accompanied by volunteers no c/o pain or shortness of breath at discharge.  Coco Sharpnack, Tivis Ringer, RN

## 2017-02-24 NOTE — Progress Notes (Signed)
SATURATION QUALIFICATIONS: (This note is used to comply with regulatory documentation for home oxygen)  Patient Saturations on Room Air at Rest =95%  Patient Saturations on Room Air while Ambulating = 91%   Please briefly explain why patient needs home oxygen:n/a  Brighten Orndoff, Tivis Ringer, RN

## 2017-02-24 NOTE — Evaluation (Signed)
Physical Therapy Evaluation and Discharge Patient Details Name: David Guerra MRN: [id number] DOB: 19-Nov-1933 Today's Date: 02/24/2017   History of Present Illness  83 y.o. male w/ hx M 12/2017I>>stents, CHF, AICD, MML, DM, HTN, HLD, visiting here from [place], developed worsening SOB and was admitted 03/31 w/ CHF exacerbation, CP.   Clinical Impression  Pt functioning near baseline. Pt amb 300' with SpO2>90% on RA. HR 66-120s during walking. Pt reports 2/4 DOE s/p amb and stair negotiation. Pt with good home set up and support. Pt can stay with daughter here in [place] until he recovers and can ride the train back to [place]. Pt with no further acute PT needs at this time. PT SIGNING OFF. Please re-consult if needed in future.    Follow Up Recommendations No PT follow up;Supervision - Intermittent    Equipment Recommendations  None recommended by PT    Recommendations for Other Services       Precautions / Restrictions Precautions Precautions: Fall Precaution Comments: chronic SOB/DOE Restrictions Weight Bearing Restrictions: No      Mobility  Bed Mobility               General bed mobility comments: pt sitting at EOB upon PT arrival  Transfers Overall transfer level: Needs assistance Equipment used: None Transfers: Sit to/from Stand Sit to Stand: Supervision         General transfer comment: good technique  Ambulation/Gait Ambulation/Gait assistance: Min guard Ambulation Distance (Feet): 300 Feet Assistive device: None Gait Pattern/deviations: Step-through pattern;Decreased stride length;Antalgic Gait velocity: slow Gait velocity interpretation: Below normal speed for age/gender General Gait Details: no overt episodes of LOB, mild unsteadiness but didn't require physical assist for safe ambulation  Stairs Stairs: Yes Stairs assistance: Min guard Stair Management: Two rails;Step to pattern Number of Stairs: 3 General stair comments: step to  Wheelchair Mobility     Modified Rankin (Stroke Patients Only)       Balance Overall balance assessment: Independent                                           Pertinent Vitals/Pain Pain Assessment: No/denies pain    Home Living Family/patient expects to be discharged to:: Private residence Living Arrangements: Alone (but staying with daughter now in [place], lives alone in [place]) Available Help at Discharge: Family;Available PRN/intermittently (dtr works during day, granddtrs avail) Type of Home: House Home Access: Stairs to enter Entrance Stairs-Rails: Can reach both Entrance Stairs-Number of Steps: 3 Home Layout: One level Home Equipment: [place] - single point;Grab bars - tub/shower      Prior Function Level of Independence: Independent;Needs assistance   Gait / Transfers Assistance Needed: uses cane in community  ADL's / Homemaking Assistance Needed: pt does own dressing, bathing, cooking but doesn't drive since his heart attacke, son's drive pt for grocery shopping        Hand Dominance   Dominant Hand: Right    Extremity/Trunk Assessment   Upper Extremity Assessment Upper Extremity Assessment: Overall WFL for tasks assessed    Lower Extremity Assessment Lower Extremity Assessment: Overall WFL for tasks assessed    Cervical / Trunk Assessment Cervical / Trunk Assessment: Normal  Communication   Communication: No difficulties  Cognition Arousal/Alertness: Awake/alert Behavior During Therapy: WFL for tasks assessed/performed Overall Cognitive Status: Within Functional Limits for tasks assessed  General Comments      Exercises     Assessment/Plan    PT Assessment Patent does not need any further PT services  PT Problem List         PT Treatment Interventions      PT Goals (Current goals can be found in the Care Plan section)  Acute Rehab PT Goals Patient Stated Goal: home PT Goal Formulation: All  assessment and education complete, DC therapy    Frequency     Barriers to discharge        Co-evaluation               End of Session Equipment Utilized During Treatment: Gait belt Activity Tolerance: Patient limited by fatigue Patient left: in bed;with call bell/phone within reach (sitting EOB) Nurse Communication: Mobility status PT Visit Diagnosis: Unsteadiness on feet (R26.81)    Time: 0973-5329 PT Time Calculation (min) (ACUTE ONLY): 14 min   Charges:   PT Evaluation $PT Eval Low Complexity: 1 Procedure     PT G CodesKittie Plater, PT, DPT Pager #: [phone] Office #: [phone]    [place] 02/24/2017, 11:03 AM

## 2017-02-24 NOTE — Progress Notes (Signed)
Progress Note  Patient Name: David Guerra Date of Encounter: 02/24/2017  Primary Cardiologist: Dr Johnsie Cancel here, lives in [place]  Patient Profile     83 y.o. male w/ hx M 12/2017I>>stents, CHF, AICD, MML, DM, HTN, HLD, visiting here from [place], developed worsening SOB and was admitted 03/31 w/ CHF exacerbation, CP.   Subjective   Denies dyspnea or chest pain  Inpatient Medications    Scheduled Meds: . allopurinol  300 mg Oral Daily  . amiodarone  200 mg Oral Daily  . aspirin EC  81 mg Oral Daily  . atorvastatin  40 mg Oral QHS  . carvedilol  12.5 mg Oral BID WC  . clopidogrel  75 mg Oral Daily  . furosemide  80 mg Intravenous Q8H  . hydrALAZINE  50 mg Oral Q6H  . insulin aspart  0-9 Units Subcutaneous TID WC  . isosorbide dinitrate  30 mg Oral TID  . latanoprost  1 drop Both Eyes QHS  . tamsulosin  0.4 mg Oral Daily   Continuous Infusions:  PRN Meds: acetaminophen, morphine injection, ondansetron (ZOFRAN) IV   Vital Signs    Vitals:   02/24/17 0440 02/24/17 0455 02/24/17 0626 02/24/17 0746  BP: (!) 157/77 (!) 162/77 (!) 162/77 136/72  Pulse: 60 60    Resp: 17 12    Temp:      TempSrc:      SpO2: 98% 92%  95%  Weight:      Height:        Intake/Output Summary (Last 24 hours) at 02/24/17 0828 Last data filed at 02/24/17 0263  Gross per 24 hour  Intake              600 ml  Output             2750 ml  Net            -2150 ml   Filed Weights   02/22/17 0502 02/23/17 0421 02/24/17 0300  Weight: 196 lb (88.9 kg) 193 lb 12.8 oz (87.9 kg) 192 lb 3.2 oz (87.2 kg)    Telemetry    SR, PVCs - Personally Reviewed  Physical Exam   General: Well developed, well nourished, male appearing in no acute distress. Head: Normal Neck: Supple Lungs:  CTA Heart: RRR Abdomen: Soft, non-tender, non-distended Extremities: No edema. Neuro: Alert and oriented X 3. Moves all extremities spontaneously.  Labs    Hematology  Recent Labs Lab 02/21/17 1926 02/21/17 2255  02/22/17 0423 02/23/17 0249  WBC 5.6  --  6.0 6.2  RBC 3.38* 3.18* 3.29* 3.34*  HGB 9.0*  --  8.8* 8.9*  HCT 28.2*  --  27.6* 27.9*  MCV 83.4  --  83.9 83.5  MCH 26.6  --  26.7 26.6  MCHC 31.9  --  31.9 31.9  RDW 17.4*  --  17.2* 17.3*  PLT 137*  --  133* 144*    Chemistry  Recent Labs Lab 02/22/17 0123 02/22/17 0423 02/23/17 0249 02/24/17 0346  NA 142  --  142 143  K 2.9*  --  3.5 3.7  CL 106  --  107 105  CO2 28  --  27 28  GLUCOSE 90  --  86 90  BUN 20  --  19 19  CREATININE 1.74*  --  1.80* 1.91*  CALCIUM 8.4*  --  8.3* 8.5*  PROT  --  5.6* 6.0*  --   ALBUMIN  --  3.1* 3.2*  --  AST  --  21 19  --   ALT  --  46 45  --   ALKPHOS  --  54 52  --   BILITOT  --  0.7 0.6  --   GFRNONAA 35*  --  33* 31*  GFRAA 40*  --  39* 36*  ANIONGAP 8  --  8 10     Cardiac Enzymes  Recent Labs Lab 02/21/17 2255 02/22/17 0123 02/22/17 0423  TROPONINI 0.03* 0.03* 0.03*     Recent Labs Lab 02/21/17 1937 02/21/17 2231  TROPIPOC 0.04 0.03     BNP  Recent Labs Lab 02/21/17 2027 02/22/17 0423  BNP 941.9* 1,024.3*     Radiology    Dg Chest 2 View Result Date: 02/21/2017 CLINICAL DATA:  Acute onset of shortness of breath and generalized chest pain. Nausea and headache. Initial encounter. EXAM: CHEST  2 VIEW COMPARISON:  Chest radiograph performed 06/01/2013 FINDINGS: The lungs are well-aerated. Vascular congestion is noted. Increased interstitial markings raise question for mild interstitial edema. There is no evidence of pleural effusion or pneumothorax. The heart is mildly enlarged. A pacemaker/AICD is noted at the left chest wall, with leads ending at the right atrium and right ventricle. No acute osseous abnormalities are seen. IMPRESSION: Vascular congestion and mild cardiomegaly. Increased interstitial markings raise question for mild interstitial edema. Electronically Signed   By: Garald Balding M.D.   On: 02/21/2017 20:25     Cardiac Studies   ECHO:  02/22/2017 - Left ventricle: Inferior wall hypokinesis The cavity size was   mildly dilated. Wall thickness was normal. Systolic function was   moderately reduced. The estimated ejection fraction was in the   range of 35% to 40%. - Mitral valve: There was mild regurgitation. - Left atrium: The atrium was moderately dilated. - Right ventricle: The cavity size was mildly dilated. - Atrial septum: No defect or patent foramen ovale was identified. - Pulmonary arteries: PA peak pressure: 53 mm Hg (S). - Pericardium, extracardiac: A trivial pericardial effusion was   identified.  Patient Profile     83 y.o. male w/ hx M 12/2017I>>stents, CHF, AICD, MML, DM, HTN, HLD, visiting here from [place], developed worsening SOB and was admitted 03/31 w/ CHF exacerbation, CP.   Assessment & Plan    1 acute on chronic combined systolic/diastolic congestive heart failure-volume status has improved. Change Lasix to 60 mg twice a day. Patient instructed on low sodium diet and fluid restriction. He will need close follow-up with his cardiologist in [place]. I would recommend checking his potassium and renal function in 1 week. Decrease KCl to 20 mEq twice a day.  2 ischemic cardiomyopathy-continue hydralazine and nitrates. He likely is not on an ACE inhibitor because of renal insufficiency. Continue carvedilol.  3 coronary artery disease-continue aspirin, Plavix and statin.   4 chronic stage III kidney disease-follow-up laboratories one week after discharge in [place] as outlined above.   5 Hypertension-continue present blood pressure medications.     We will sign off. Please call with questions.   Signed, Kirk Ruths , MD 8:28 AM 02/24/2017

## 2017-03-04 ENCOUNTER — Encounter (HOSPITAL_COMMUNITY): Payer: Self-pay

## 2017-03-04 ENCOUNTER — Emergency Department (HOSPITAL_COMMUNITY): Payer: Medicare Other

## 2017-03-04 ENCOUNTER — Emergency Department (HOSPITAL_COMMUNITY)
Admission: EM | Admit: 2017-03-04 | Discharge: 2017-03-04 | Disposition: A | Discharge status: Home / Self Care | Payer: Medicare Other | Attending: Emergency Medicine | Admitting: Emergency Medicine

## 2017-03-04 DIAGNOSIS — Z7984 Long term (current) use of oral hypoglycemic drugs: Secondary | ICD-10-CM | POA: Insufficient documentation

## 2017-03-04 DIAGNOSIS — I252 Old myocardial infarction: Secondary | ICD-10-CM | POA: Diagnosis not present

## 2017-03-04 DIAGNOSIS — I130 Hypertensive heart and chronic kidney disease with heart failure and stage 1 through stage 4 chronic kidney disease, or unspecified chronic kidney disease: Secondary | ICD-10-CM | POA: Diagnosis not present

## 2017-03-04 DIAGNOSIS — I2510 Atherosclerotic heart disease of native coronary artery without angina pectoris: Secondary | ICD-10-CM | POA: Diagnosis not present

## 2017-03-04 DIAGNOSIS — R0789 Other chest pain: Secondary | ICD-10-CM | POA: Diagnosis not present

## 2017-03-04 DIAGNOSIS — Z955 Presence of coronary angioplasty implant and graft: Secondary | ICD-10-CM | POA: Insufficient documentation

## 2017-03-04 DIAGNOSIS — Z95810 Presence of automatic (implantable) cardiac defibrillator: Secondary | ICD-10-CM | POA: Insufficient documentation

## 2017-03-04 DIAGNOSIS — R079 Chest pain, unspecified: Secondary | ICD-10-CM | POA: Diagnosis present

## 2017-03-04 DIAGNOSIS — Z7982 Long term (current) use of aspirin: Secondary | ICD-10-CM | POA: Insufficient documentation

## 2017-03-04 DIAGNOSIS — I5022 Chronic systolic (congestive) heart failure: Secondary | ICD-10-CM | POA: Diagnosis not present

## 2017-03-04 DIAGNOSIS — Z79899 Other long term (current) drug therapy: Secondary | ICD-10-CM | POA: Insufficient documentation

## 2017-03-04 DIAGNOSIS — R059 Cough, unspecified: Secondary | ICD-10-CM

## 2017-03-04 DIAGNOSIS — N183 Chronic kidney disease, stage 3 (moderate): Secondary | ICD-10-CM | POA: Diagnosis not present

## 2017-03-04 DIAGNOSIS — R05 Cough: Secondary | ICD-10-CM | POA: Insufficient documentation

## 2017-03-04 LAB — CBC
HEMATOCRIT: 31.3 % — AB (ref 39.0–52.0)
Hemoglobin: 9.7 g/dL — ABNORMAL LOW (ref 13.0–17.0)
MCH: 25.7 pg — ABNORMAL LOW (ref 26.0–34.0)
MCHC: 31 g/dL (ref 30.0–36.0)
MCV: 83 fL (ref 78.0–100.0)
PLATELETS: 152 10*3/uL (ref 150–400)
RBC: 3.77 MIL/uL — ABNORMAL LOW (ref 4.22–5.81)
RDW: 16.6 % — AB (ref 11.5–15.5)
WBC: 6 10*3/uL (ref 4.0–10.5)

## 2017-03-04 LAB — BASIC METABOLIC PANEL
Anion gap: 9 (ref 5–15)
BUN: 15 mg/dL (ref 6–20)
CALCIUM: 8.7 mg/dL — AB (ref 8.9–10.3)
CO2: 29 mmol/L (ref 22–32)
CREATININE: 1.85 mg/dL — AB (ref 0.61–1.24)
Chloride: 103 mmol/L (ref 101–111)
GFR calc Af Amer: 37 mL/min — ABNORMAL LOW (ref >60)
GFR, EST NON AFRICAN AMERICAN: 32 mL/min — AB (ref >60)
GLUCOSE: 88 mg/dL (ref 65–99)
Potassium: 3.2 mmol/L — ABNORMAL LOW (ref 3.5–5.1)
SODIUM: 141 mmol/L (ref 135–145)

## 2017-03-04 LAB — I-STAT TROPONIN, ED
TROPONIN I, POC: 0.04 ng/mL (ref 0.00–0.08)
TROPONIN I, POC: 0.06 ng/mL (ref 0.00–0.08)

## 2017-03-04 MED ORDER — ASPIRIN 81 MG PO CHEW
162.0000 mg | CHEWABLE_TABLET | Freq: Once | ORAL | Status: AC
  Administered 2017-03-04: 162 mg via ORAL
  Filled 2017-03-04: qty 2

## 2017-03-04 MED ORDER — IPRATROPIUM-ALBUTEROL 0.5-2.5 (3) MG/3ML IN SOLN
3.0000 mL | Freq: Once | RESPIRATORY_TRACT | Status: AC
  Administered 2017-03-04: 3 mL via RESPIRATORY_TRACT
  Filled 2017-03-04: qty 3

## 2017-03-04 MED ORDER — BENZONATATE 100 MG PO CAPS: 100.0000 mg | ORAL_CAPSULE | Freq: Three times a day (TID) | ORAL | 0 refills | Status: DC

## 2017-03-04 NOTE — ED Notes (Signed)
Patient Alert and oriented X4. Stable and ambulatory. Patient verbalized understanding of the discharge instructions.  Patient belongings were taken by the patient.  

## 2017-03-04 NOTE — ED Provider Notes (Signed)
[facility] DEPT Provider Note   CSN: [id number] Arrival date & time: 03/04/17  1622     History   Chief Complaint Chief Complaint  Patient presents with  . Chest Pain    HPI David Guerra is a 83 y.o. male.  The history is provided by the patient, a relative and medical records.  Chest Pain   This is a recurrent problem. The current episode started 3 to 5 hours ago. The problem occurs constantly. The problem has not changed since onset.The pain is associated with coughing. Pain location: left anterolateral chest. The pain is at a severity of 5/10. The pain is moderate. The quality of the pain is described as sharp. The pain does not radiate. Exacerbated by: Coughing. Associated symptoms include cough. Pertinent negatives include no abdominal pain, no back pain, no diaphoresis, no dizziness, no fever, no headaches, no lower extremity edema, no nausea, no near-syncope, no numbness, no palpitations, no shortness of breath, no syncope, no vomiting and no weakness. He has tried nothing for the symptoms. Risk factors include being elderly, male gender and smoking/tobacco exposure.  His past medical history is significant for CAD and CHF.  Pertinent negatives for past medical history include no seizures.  Procedure history is positive for cardiac catheterization and echocardiogram.    Past Medical History:  Diagnosis Date  . Acute on chronic systolic and diastolic heart failure, NYHA class 4 ([place]) 02/21/2017  . AICD (automatic cardioverter/defibrillator) present    [employer]  . Cancer ([place])    mulitple myeloma  . Chronic systolic CHF (congestive heart failure), NYHA class 3 ([place])   . CKD (chronic kidney disease), stage III   . Coronary artery disease   . Diabetes mellitus without complication ([place])   . Hypertension   . Myocardial infarction   . PUD (peptic ulcer disease)     Patient Active Problem List   Diagnosis Date Noted  . CKD (chronic kidney disease), stage III     . Chest pain 02/21/2017  . Acute on chronic systolic and diastolic heart failure, NYHA class 4 ([place]) 02/21/2017  . HTN (hypertension) 02/21/2017  . AICD (automatic cardioverter/defibrillator) present 02/21/2017  . CAD (coronary artery disease) 02/21/2017  . Diabetes ([place]) 02/21/2017    Past Surgical History:  Procedure Laterality Date  . APPENDECTOMY    . BACK SURGERY    . CARDIAC DEFIBRILLATOR PLACEMENT  11/2016   [employer]  . CORONARY ANGIOPLASTY WITH STENT PLACEMENT  11/2016   total of 2 stents, 1 in January, 1 in February, both in [facility] in [place], [place] Medications    Prior to Admission medications   Medication Sig Start Date End Date Taking? Authorizing Provider  allopurinol (ZYLOPRIM) 300 MG tablet Take 300 mg by mouth daily. For gout 01/16/17  Yes Historical Provider, MD  amiodarone (PACERONE) 200 MG tablet Take 1 tablet (200 mg total) by mouth daily. 02/25/17  Yes Reyne Dumas, MD  aspirin EC 81 MG tablet Take 81 mg by mouth daily. For heart   Yes Historical Provider, MD  atorvastatin (LIPITOR) 80 MG tablet Take 40 mg by mouth at bedtime. For lowering cholesterol 01/16/17  Yes Historical Provider, MD  carvedilol (COREG) 12.5 MG tablet Take 12.5 mg by mouth every 12 (twelve) hours. For heart 01/16/17  Yes Historical Provider, MD  clopidogrel (PLAVIX) 75 MG tablet Take 75 mg by mouth daily. For blood thinning 01/29/17  Yes  Historical Provider, MD  fluticasone (FLONASE) 50 MCG/ACT nasal spray Place 1 spray into both nostrils at bedtime. 01/29/17  Yes Historical Provider, MD  furosemide (LASIX) 20 MG tablet Take 3 tablets (60 mg total) by mouth 2 (two) times daily. 02/24/17 03/26/17 Yes Reyne Dumas, MD  glipiZIDE (GLUCOTROL) 5 MG tablet Take 0.5 tablets (2.5 mg total) by mouth daily. For diabetes 02/24/17  Yes Reyne Dumas, MD  hydrALAZINE (APRESOLINE) 50 MG tablet Take 1 tablet (50 mg total) by mouth every 6 (six) hours. 02/24/17  03/26/17 Yes Reyne Dumas, MD  isosorbide dinitrate (ISORDIL) 30 MG tablet Take 1 tablet (30 mg total) by mouth 3 (three) times daily. 02/24/17 03/26/17 Yes Reyne Dumas, MD  latanoprost (XALATAN) 0.005 % ophthalmic solution Place 1 drop into both eyes at bedtime.   Yes Historical Provider, MD  potassium chloride SA (K-DUR,KLOR-CON) 20 MEQ tablet Take 20 mEq by mouth 2 (two) times daily.   Yes Historical Provider, MD  tamsulosin (FLOMAX) 0.4 MG CAPS capsule Take 0.4 mg by mouth daily. 01/16/17  Yes Historical Provider, MD  vitamin B-12 (CYANOCOBALAMIN) 1000 MCG tablet Take 1,000 mcg by mouth daily.   Yes Historical Provider, MD  benzonatate (TESSALON) 100 MG capsule Take 1 capsule (100 mg total) by mouth every 8 (eight) hours. 03/04/17   Jenny Reichmann, MD    Family History Family History  Problem Relation Age of Onset  . Dementia Mother   . Cancer Father   . Prostate cancer Brother   . Prostate cancer Brother     Social History Social History  Substance Use Topics  . Smoking status: Never Smoker  . Smokeless tobacco: Never Used  . Alcohol use Yes     Comment: occasionally     Allergies   Patient has no known allergies.   Review of Systems Review of Systems  Constitutional: Negative for chills, diaphoresis and fever.  HENT: Negative for ear pain and sore throat.   Eyes: Negative for pain and visual disturbance.  Respiratory: Positive for cough. Negative for shortness of breath.   Cardiovascular: Positive for chest pain. Negative for palpitations, syncope and near-syncope.  Gastrointestinal: Negative for abdominal pain, nausea and vomiting.  Genitourinary: Negative for dysuria and hematuria.  Musculoskeletal: Negative for arthralgias and back pain.  Skin: Negative for color change and rash.  Neurological: Negative for dizziness, seizures, syncope, weakness, numbness and headaches.  All other systems reviewed and are negative.    Physical Exam Updated Vital Signs BP (!) 196/88    Pulse (!) 59   Temp 98.2 F (36.8 C) (Oral)   Resp 20   SpO2 96%   Physical Exam  Constitutional: He appears well-developed and well-nourished.  HENT:  Head: Normocephalic and atraumatic.  Eyes: Conjunctivae are normal.  Neck: Neck supple.  Cardiovascular: Normal rate and regular rhythm.   No murmur heard. Pulmonary/Chest: Effort normal. No respiratory distress. He has wheezes (mild end expiratory).  Abdominal: Soft. There is no tenderness.  Musculoskeletal: He exhibits no edema.  Neurological: He is alert.  Skin: Skin is warm and dry.  Psychiatric: He has a normal mood and affect.  Nursing note and vitals reviewed.    ED Treatments / Results  Labs (all labs ordered are listed, but only abnormal results are displayed) Labs Reviewed  BASIC METABOLIC PANEL - Abnormal; Notable for the following:       Result Value   Potassium 3.2 (*)    Creatinine, Ser 1.85 (*)    Calcium 8.7 (*)  GFR calc non Af Amer 32 (*)    GFR calc Af Amer 37 (*)    All other components within normal limits  CBC - Abnormal; Notable for the following:    RBC 3.77 (*)    Hemoglobin 9.7 (*)    HCT 31.3 (*)    MCH 25.7 (*)    RDW 16.6 (*)    All other components within normal limits  I-STAT TROPOININ, ED  I-STAT TROPOININ, ED    EKG  EKG Interpretation None       Radiology Dg Chest 2 View  Result Date: 03/04/2017 CLINICAL DATA:  Left-sided chest pain. EXAM: CHEST  2 VIEW COMPARISON:  02/21/2017 FINDINGS: The heart is borderline enlarged but stable. The mediastinal and hilar contours are within normal limits and unchanged. Mild tortuosity of the thoracic aorta. The pacer wires are stable. The lungs demonstrate chronic bronchitic changes but no acute infiltrates, edema or effusions. The bony thorax is intact. IMPRESSION: No acute cardiopulmonary findings.  Chronic lung changes. Electronically Signed   By: Marijo Sanes M.D.   On: 03/04/2017 17:17    Procedures Procedures (including  critical care time)  Medications Ordered in ED Medications  aspirin chewable tablet 162 mg (162 mg Oral Given 03/04/17 2151)  ipratropium-albuterol (DUONEB) 0.5-2.5 (3) MG/3ML nebulizer solution 3 mL (3 mLs Nebulization Given 03/04/17 2153)     Initial Impression / Assessment and Plan / ED Course  I have reviewed the triage vital signs and the nursing notes.  Pertinent labs & imaging results that were available during my care of the patient were reviewed by me and considered in my medical decision making (see chart for details).    Pt with h/o CAD and recent MI in December 2017 S/P 2 stents, then AICD placement in January 2018, known CHF but he is not sure what type, Multiple Myeloma (in remission; no active treatment in 8 years), HTN, DM with CKD (last creatinine in this system was 4 years ago; 1.37 then; baseline unclear), and PUD presents with chest pain and a cough. Says he's had a cold for the last 2days and has been coughing; today, after a coughing fit, he complained of left-sided, sharp, CP. The pain was localized to the left anterolateral chest w/o radiation, was 8/10 in severity but decreased to 5/10 w/o intervention, and was not associated with diaphoresis, SOB, N/V. Given his recent history, the Pt became concerned and decided to come to the ED for evaluation.  VS & exam as above. EKG: paced rhythm @ 60bpm w/o signs of ischemia. CXR w/chronic changes but no focal consolidations concerning for PNA. Duoneb given for wheezing w/some improvement. Labs remarkable for initial troponin 0.04; repeat 0.06. Do not believe the Pt's symptoms are primarily cardiac in etiology given history, exam, and workup.  Explained all results to the Pt & daughter. Will d/c w/rx for Tessalon. Recommending f/u w/PCP & cardiology. Pt acknowledged understanding of and concurrence with the plan. ED return precautions provided. All questions answered to his satisfaction. In stable condition at the time of discharge.      Final Clinical Impressions(s) / ED Diagnoses   Final diagnoses:  Cough  Chest wall pain    New Prescriptions Discharge Medication List as of 03/04/2017 11:29 PM    START taking these medications   Details  benzonatate (TESSALON) 100 MG capsule Take 1 capsule (100 mg total) by mouth every 8 (eight) hours., Starting Wed 03/04/2017, Print         [employer]  Ericka Pontiff, MD 03/05/17 0221    Margette Fast, MD 03/05/17 1000

## 2017-03-04 NOTE — ED Triage Notes (Signed)
Pt presents to the ed with complaints of chest pain on the left side of his chest that started this afternoon around 1200. States it does not radiate and he feels slightly short of breath. He has had a cough for 2 days

## 2017-03-05 ENCOUNTER — Emergency Department (HOSPITAL_COMMUNITY): Payer: Medicare Other

## 2017-03-05 ENCOUNTER — Encounter (HOSPITAL_COMMUNITY): Payer: Self-pay

## 2017-03-05 ENCOUNTER — Observation Stay (HOSPITAL_COMMUNITY)
Admission: EM | Admit: 2017-03-05 | Discharge: 2017-03-06 | Disposition: A | Discharge status: Home / Self Care | Payer: Medicare Other | Attending: Internal Medicine | Admitting: Internal Medicine

## 2017-03-05 DIAGNOSIS — I2510 Atherosclerotic heart disease of native coronary artery without angina pectoris: Secondary | ICD-10-CM | POA: Insufficient documentation

## 2017-03-05 DIAGNOSIS — I252 Old myocardial infarction: Secondary | ICD-10-CM | POA: Insufficient documentation

## 2017-03-05 DIAGNOSIS — J209 Acute bronchitis, unspecified: Secondary | ICD-10-CM | POA: Diagnosis not present

## 2017-03-05 DIAGNOSIS — Z79899 Other long term (current) drug therapy: Secondary | ICD-10-CM | POA: Diagnosis not present

## 2017-03-05 DIAGNOSIS — R778 Other specified abnormalities of plasma proteins: Secondary | ICD-10-CM | POA: Insufficient documentation

## 2017-03-05 DIAGNOSIS — Z7902 Long term (current) use of antithrombotics/antiplatelets: Secondary | ICD-10-CM | POA: Insufficient documentation

## 2017-03-05 DIAGNOSIS — Z7982 Long term (current) use of aspirin: Secondary | ICD-10-CM | POA: Insufficient documentation

## 2017-03-05 DIAGNOSIS — N400 Enlarged prostate without lower urinary tract symptoms: Secondary | ICD-10-CM | POA: Insufficient documentation

## 2017-03-05 DIAGNOSIS — E1122 Type 2 diabetes mellitus with diabetic chronic kidney disease: Secondary | ICD-10-CM | POA: Diagnosis not present

## 2017-03-05 DIAGNOSIS — I5042 Chronic combined systolic (congestive) and diastolic (congestive) heart failure: Secondary | ICD-10-CM | POA: Diagnosis not present

## 2017-03-05 DIAGNOSIS — J440 Chronic obstructive pulmonary disease with acute lower respiratory infection: Secondary | ICD-10-CM | POA: Insufficient documentation

## 2017-03-05 DIAGNOSIS — Z9861 Coronary angioplasty status: Secondary | ICD-10-CM

## 2017-03-05 DIAGNOSIS — I10 Essential (primary) hypertension: Secondary | ICD-10-CM | POA: Diagnosis present

## 2017-03-05 DIAGNOSIS — Z7901 Long term (current) use of anticoagulants: Secondary | ICD-10-CM | POA: Insufficient documentation

## 2017-03-05 DIAGNOSIS — Z95810 Presence of automatic (implantable) cardiac defibrillator: Secondary | ICD-10-CM | POA: Insufficient documentation

## 2017-03-05 DIAGNOSIS — N186 End stage renal disease: Secondary | ICD-10-CM | POA: Insufficient documentation

## 2017-03-05 DIAGNOSIS — R509 Fever, unspecified: Secondary | ICD-10-CM

## 2017-03-05 DIAGNOSIS — Z7984 Long term (current) use of oral hypoglycemic drugs: Secondary | ICD-10-CM | POA: Insufficient documentation

## 2017-03-05 DIAGNOSIS — Z955 Presence of coronary angioplasty implant and graft: Secondary | ICD-10-CM | POA: Insufficient documentation

## 2017-03-05 DIAGNOSIS — R0602 Shortness of breath: Secondary | ICD-10-CM

## 2017-03-05 DIAGNOSIS — Z8579 Personal history of other malignant neoplasms of lymphoid, hematopoietic and related tissues: Secondary | ICD-10-CM | POA: Diagnosis not present

## 2017-03-05 DIAGNOSIS — Z7951 Long term (current) use of inhaled steroids: Secondary | ICD-10-CM | POA: Diagnosis not present

## 2017-03-05 DIAGNOSIS — I132 Hypertensive heart and chronic kidney disease with heart failure and with stage 5 chronic kidney disease, or end stage renal disease: Secondary | ICD-10-CM | POA: Diagnosis not present

## 2017-03-05 DIAGNOSIS — J219 Acute bronchiolitis, unspecified: Principal | ICD-10-CM | POA: Insufficient documentation

## 2017-03-05 DIAGNOSIS — N183 Chronic kidney disease, stage 3 unspecified: Secondary | ICD-10-CM | POA: Diagnosis present

## 2017-03-05 LAB — BASIC METABOLIC PANEL
Anion gap: 11 (ref 5–15)
BUN: 17 mg/dL (ref 6–20)
CALCIUM: 8.6 mg/dL — AB (ref 8.9–10.3)
CO2: 26 mmol/L (ref 22–32)
CREATININE: 1.9 mg/dL — AB (ref 0.61–1.24)
Chloride: 101 mmol/L (ref 101–111)
GFR, EST AFRICAN AMERICAN: 36 mL/min — AB (ref >60)
GFR, EST NON AFRICAN AMERICAN: 31 mL/min — AB (ref >60)
Glucose, Bld: 99 mg/dL (ref 65–99)
Potassium: 3.5 mmol/L (ref 3.5–5.1)
SODIUM: 138 mmol/L (ref 135–145)

## 2017-03-05 LAB — I-STAT CG4 LACTIC ACID, ED: Lactic Acid, Venous: 1.67 mmol/L (ref 0.5–1.9)

## 2017-03-05 LAB — I-STAT TROPONIN, ED: TROPONIN I, POC: 0.11 ng/mL — AB (ref 0.00–0.08)

## 2017-03-05 LAB — CBC
HCT: 32.1 % — ABNORMAL LOW (ref 39.0–52.0)
Hemoglobin: 10.2 g/dL — ABNORMAL LOW (ref 13.0–17.0)
MCH: 26.1 pg (ref 26.0–34.0)
MCHC: 31.8 g/dL (ref 30.0–36.0)
MCV: 82.1 fL (ref 78.0–100.0)
PLATELETS: 145 10*3/uL — AB (ref 150–400)
RBC: 3.91 MIL/uL — ABNORMAL LOW (ref 4.22–5.81)
RDW: 16.5 % — AB (ref 11.5–15.5)
WBC: 8.1 10*3/uL (ref 4.0–10.5)

## 2017-03-05 LAB — GLUCOSE, CAPILLARY: Glucose-Capillary: 107 mg/dL — ABNORMAL HIGH (ref 65–99)

## 2017-03-05 LAB — INFLUENZA PANEL BY PCR (TYPE A & B)
Influenza A By PCR: NEGATIVE
Influenza B By PCR: NEGATIVE

## 2017-03-05 LAB — TROPONIN I
TROPONIN I: 0.11 ng/mL — AB (ref <0.03)
TROPONIN I: 0.09 ng/mL — AB (ref <0.03)

## 2017-03-05 MED ORDER — ACETAMINOPHEN 650 MG RE SUPP: 650.0000 mg | Freq: Four times a day (QID) | RECTAL | Status: DC | PRN

## 2017-03-05 MED ORDER — ATORVASTATIN CALCIUM 40 MG PO TABS
40.0000 mg | ORAL_TABLET | Freq: Every day | ORAL | Status: DC
Start: 2017-03-05 — End: 2017-03-06
  Administered 2017-03-05: 40 mg via ORAL
  Filled 2017-03-05: qty 1

## 2017-03-05 MED ORDER — ACETAMINOPHEN 325 MG PO TABS
650.0000 mg | ORAL_TABLET | Freq: Once | ORAL | Status: AC
  Administered 2017-03-05: 650 mg via ORAL
  Filled 2017-03-05: qty 2

## 2017-03-05 MED ORDER — PREDNISONE 20 MG PO TABS
40.0000 mg | ORAL_TABLET | Freq: Every day | ORAL | Status: DC
  Administered 2017-03-05: 40 mg via ORAL
  Filled 2017-03-05 (×2): qty 2

## 2017-03-05 MED ORDER — ACETAMINOPHEN 325 MG PO TABS
650.0000 mg | ORAL_TABLET | Freq: Four times a day (QID) | ORAL | Status: DC | PRN
  Administered 2017-03-05: 650 mg via ORAL
  Filled 2017-03-05: qty 2

## 2017-03-05 MED ORDER — ALLOPURINOL 300 MG PO TABS
300.0000 mg | ORAL_TABLET | Freq: Every day | ORAL | Status: DC
  Administered 2017-03-06: 300 mg via ORAL
  Filled 2017-03-05: qty 1

## 2017-03-05 MED ORDER — CARVEDILOL 12.5 MG PO TABS
12.5000 mg | ORAL_TABLET | Freq: Two times a day (BID) | ORAL | Status: DC
  Administered 2017-03-05 – 2017-03-06 (×2): 12.5 mg via ORAL
  Filled 2017-03-05 (×2): qty 1

## 2017-03-05 MED ORDER — GUAIFENESIN-DM 100-10 MG/5ML PO SYRP
5.0000 mL | ORAL_SOLUTION | ORAL | Status: DC | PRN
  Administered 2017-03-05 – 2017-03-06 (×2): 5 mL via ORAL
  Filled 2017-03-05 (×2): qty 5

## 2017-03-05 MED ORDER — BENZONATATE 100 MG PO CAPS
100.0000 mg | ORAL_CAPSULE | Freq: Three times a day (TID) | ORAL | Status: DC
  Administered 2017-03-05 – 2017-03-06 (×2): 100 mg via ORAL
  Filled 2017-03-05 (×2): qty 1

## 2017-03-05 MED ORDER — TAMSULOSIN HCL 0.4 MG PO CAPS
0.4000 mg | ORAL_CAPSULE | Freq: Every day | ORAL | Status: DC
  Administered 2017-03-06: 0.4 mg via ORAL
  Filled 2017-03-05: qty 1

## 2017-03-05 MED ORDER — INSULIN ASPART 100 UNIT/ML ~~LOC~~ SOLN: 0.0000 [IU] | Freq: Every day | SUBCUTANEOUS | Status: DC

## 2017-03-05 MED ORDER — SODIUM CHLORIDE 0.9% FLUSH
3.0000 mL | Freq: Two times a day (BID) | INTRAVENOUS | Status: DC
  Administered 2017-03-05 – 2017-03-06 (×2): 3 mL via INTRAVENOUS

## 2017-03-05 MED ORDER — ONDANSETRON HCL 4 MG/2ML IJ SOLN: 4.0000 mg | Freq: Four times a day (QID) | INTRAMUSCULAR | Status: DC | PRN

## 2017-03-05 MED ORDER — FUROSEMIDE 20 MG PO TABS
60.0000 mg | ORAL_TABLET | Freq: Two times a day (BID) | ORAL | Status: DC
  Administered 2017-03-05 – 2017-03-06 (×2): 60 mg via ORAL
  Filled 2017-03-05 (×2): qty 3

## 2017-03-05 MED ORDER — HYDROCOD POLST-CPM POLST ER 10-8 MG/5ML PO SUER
5.0000 mL | Freq: Once | ORAL | Status: AC
  Administered 2017-03-05: 5 mL via ORAL
  Filled 2017-03-05: qty 5

## 2017-03-05 MED ORDER — ASPIRIN EC 81 MG PO TBEC
81.0000 mg | DELAYED_RELEASE_TABLET | Freq: Every day | ORAL | Status: DC
  Administered 2017-03-05 – 2017-03-06 (×2): 81 mg via ORAL
  Filled 2017-03-05 (×2): qty 1

## 2017-03-05 MED ORDER — HYDRALAZINE HCL 50 MG PO TABS
50.0000 mg | ORAL_TABLET | Freq: Four times a day (QID) | ORAL | Status: DC
  Administered 2017-03-05 – 2017-03-06 (×2): 50 mg via ORAL
  Filled 2017-03-05 (×2): qty 1

## 2017-03-05 MED ORDER — VITAMIN B-12 1000 MCG PO TABS
1000.0000 ug | ORAL_TABLET | Freq: Every day | ORAL | Status: DC
  Administered 2017-03-06: 1000 ug via ORAL
  Filled 2017-03-05: qty 1

## 2017-03-05 MED ORDER — FLUTICASONE PROPIONATE 50 MCG/ACT NA SUSP
1.0000 | Freq: Every day | NASAL | Status: DC
Start: 2017-03-05 — End: 2017-03-06
  Administered 2017-03-05: 1 via NASAL
  Filled 2017-03-05: qty 16

## 2017-03-05 MED ORDER — LATANOPROST 0.005 % OP SOLN
1.0000 [drp] | Freq: Every day | OPHTHALMIC | Status: DC
Start: 2017-03-05 — End: 2017-03-06
  Administered 2017-03-05: 1 [drp] via OPHTHALMIC
  Filled 2017-03-05: qty 2.5

## 2017-03-05 MED ORDER — GUAIFENESIN ER 600 MG PO TB12
600.0000 mg | ORAL_TABLET | Freq: Two times a day (BID) | ORAL | Status: DC
  Administered 2017-03-05 – 2017-03-06 (×2): 600 mg via ORAL
  Filled 2017-03-05 (×2): qty 1

## 2017-03-05 MED ORDER — POTASSIUM CHLORIDE CRYS ER 20 MEQ PO TBCR
20.0000 meq | EXTENDED_RELEASE_TABLET | Freq: Two times a day (BID) | ORAL | Status: DC
  Administered 2017-03-05 – 2017-03-06 (×2): 20 meq via ORAL
  Filled 2017-03-05 (×2): qty 1

## 2017-03-05 MED ORDER — IPRATROPIUM BROMIDE 0.02 % IN SOLN: 0.5000 mg | Freq: Four times a day (QID) | RESPIRATORY_TRACT | Status: DC

## 2017-03-05 MED ORDER — AMIODARONE HCL 200 MG PO TABS
200.0000 mg | ORAL_TABLET | Freq: Every day | ORAL | Status: DC
  Administered 2017-03-05 – 2017-03-06 (×2): 200 mg via ORAL
  Filled 2017-03-05 (×2): qty 1

## 2017-03-05 MED ORDER — ISOSORBIDE DINITRATE 30 MG PO TABS
30.0000 mg | ORAL_TABLET | Freq: Three times a day (TID) | ORAL | Status: DC
  Administered 2017-03-05 – 2017-03-06 (×2): 30 mg via ORAL
  Filled 2017-03-05: qty 1
  Filled 2017-03-05: qty 3
  Filled 2017-03-05: qty 1
  Filled 2017-03-05: qty 3
  Filled 2017-03-05: qty 1

## 2017-03-05 MED ORDER — OSELTAMIVIR PHOSPHATE 30 MG PO CAPS
30.0000 mg | ORAL_CAPSULE | Freq: Once | ORAL | Status: AC
Start: 2017-03-05 — End: 2017-03-05
  Administered 2017-03-05: 30 mg via ORAL
  Filled 2017-03-05: qty 1

## 2017-03-05 MED ORDER — HEPARIN SODIUM (PORCINE) 5000 UNIT/ML IJ SOLN: 5000.0000 [IU] | Freq: Three times a day (TID) | INTRAMUSCULAR | Status: DC

## 2017-03-05 MED ORDER — ONDANSETRON HCL 4 MG PO TABS: 4.0000 mg | ORAL_TABLET | Freq: Four times a day (QID) | ORAL | Status: DC | PRN

## 2017-03-05 MED ORDER — IPRATROPIUM-ALBUTEROL 0.5-2.5 (3) MG/3ML IN SOLN
3.0000 mL | Freq: Once | RESPIRATORY_TRACT | Status: AC
  Administered 2017-03-05: 3 mL via RESPIRATORY_TRACT
  Filled 2017-03-05: qty 3

## 2017-03-05 MED ORDER — IPRATROPIUM-ALBUTEROL 0.5-2.5 (3) MG/3ML IN SOLN
3.0000 mL | Freq: Four times a day (QID) | RESPIRATORY_TRACT | Status: DC
  Administered 2017-03-05 – 2017-03-06 (×2): 3 mL via RESPIRATORY_TRACT
  Filled 2017-03-05 (×2): qty 3

## 2017-03-05 MED ORDER — CLOPIDOGREL BISULFATE 75 MG PO TABS
75.0000 mg | ORAL_TABLET | Freq: Every day | ORAL | Status: DC
  Administered 2017-03-05 – 2017-03-06 (×2): 75 mg via ORAL
  Filled 2017-03-05 (×2): qty 1

## 2017-03-05 MED ORDER — INSULIN ASPART 100 UNIT/ML ~~LOC~~ SOLN
0.0000 [IU] | Freq: Three times a day (TID) | SUBCUTANEOUS | Status: DC
  Administered 2017-03-06: 3 [IU] via SUBCUTANEOUS

## 2017-03-05 MED ORDER — ASPIRIN 81 MG PO CHEW
324.0000 mg | CHEWABLE_TABLET | Freq: Once | ORAL | Status: AC
  Administered 2017-03-05: 324 mg via ORAL
  Filled 2017-03-05: qty 4

## 2017-03-05 MED ORDER — ALBUTEROL SULFATE (2.5 MG/3ML) 0.083% IN NEBU: 2.5000 mg | INHALATION_SOLUTION | Freq: Four times a day (QID) | RESPIRATORY_TRACT | Status: DC

## 2017-03-05 MED ORDER — GLIPIZIDE 2.5 MG HALF TABLET
2.5000 mg | ORAL_TABLET | Freq: Every day | ORAL | Status: DC
  Administered 2017-03-06: 2.5 mg via ORAL
  Filled 2017-03-05: qty 1

## 2017-03-05 NOTE — Progress Notes (Signed)
Pt educated about safety and importance of bed alarm during the night however pt refuses to be on bed alarm. Will continue to round on patient.   Yoshiye Kraft, RN    

## 2017-03-05 NOTE — Progress Notes (Addendum)
No new orders for troponin.  Will continue to monitor.   Letha Mirabal, RN

## 2017-03-05 NOTE — ED Provider Notes (Signed)
[facility]-EMER[facility]ENCY DEPT Provider Note   CSN: [id number] Arrival date & time: 03/05/17  1305     History   Chief Complaint Chief Complaint  Patient presents with  . Shortness of Breath    HPI David Guerra is a 83 y.o. male.  The history is provided by the patient. No language interpreter was used.  Shortness of Breath     David Guerra is a 83 y.o. male who presents to the Emergency Department complaining of sob.  He is currently on his third day of shortness of breath with cough, body aches, headache, abdominal pain. He was seen in the emergency department yesterday and discharged home but had difficulty sleeping over night due to cough. He was prescribed Tessalon Perles and has noticed no relief in his symptoms. No known sick contacts. Symptoms are severe, constant worsening.  Past Medical History:  Diagnosis Date  . Acute on chronic systolic and diastolic heart failure, NYHA class 4 (HCC) 02/21/2017  . AICD (automatic cardioverter/defibrillator) present    [employer]  . Cancer (HCC)    mulitple myeloma  . Chronic systolic CHF (congestive heart failure), NYHA class 3 (HCC)   . CKD (chronic kidney disease), stage III   . Coronary artery disease   . Diabetes mellitus without complication (HCC)   . Hypertension   . Myocardial infarction   . PUD (peptic ulcer disease)     Patient Active Problem List   Diagnosis Date Noted  . CKD (chronic kidney disease), stage III   . Chest pain 02/21/2017  . Acute on chronic systolic and diastolic heart failure, NYHA class 4 (HCC) 02/21/2017  . HTN (hypertension) 02/21/2017  . AICD (automatic cardioverter/defibrillator) present 02/21/2017  . CAD (coronary artery disease) 02/21/2017  . Diabetes (HCC) 02/21/2017    Past Surgical History:  Procedure Laterality Date  . APPENDECTOMY    . BACK SURGERY    . CARDIAC DEFIBRILLATOR PLACEMENT  11/2016   [employer]  . CORONARY ANGIOPLASTY WITH STENT PLACEMENT  11/2016   total of  2 stents, 1 in January, 1 in February, both in [facility] in [place], [place]  . PROSTATE SURGERY         Home Medications    Prior to Admission medications   Medication Sig Start Date End Date Taking? Authorizing Provider  allopurinol (ZYLOPRIM) 300 MG tablet Take 300 mg by mouth daily. For gout 01/16/17   Historical Provider, MD  amiodarone (PACERONE) 200 MG tablet Take 1 tablet (200 mg total) by mouth daily. 02/25/17   Nayana Abrol, MD  aspirin EC 81 MG tablet Take 81 mg by mouth daily. For heart    Historical Provider, MD  atorvastatin (LIPITOR) 80 MG tablet Take 40 mg by mouth at bedtime. For lowering cholesterol 01/16/17   Historical Provider, MD  benzonatate (TESSALON) 100 MG capsule Take 1 capsule (100 mg total) by mouth every 8 (eight) hours. 03/04/17   Nicholas Petit, MD  carvedilol (COREG) 12.5 MG tablet Take 12.5 mg by mouth every 12 (twelve) hours. For heart 01/16/17   Historical Provider, MD  clopidogrel (PLAVIX) 75 MG tablet Take 75 mg by mouth daily. For blood thinning 01/29/17   Historical Provider, MD  fluticasone (FLONASE) 50 MCG/ACT nasal spray Place 1 spray into both nostrils at bedtime. 01/29/17   Historical Provider, MD  furosemide (LASIX) 20 MG tablet Take 3 tablets (60 mg total) by mouth 2 (two) times daily. 02/24/17 03/26/17  Nayana Abrol, MD  glipiZIDE (GLUCOTROL) 5 MG tablet   Take 0.5 tablets (2.5 mg total) by mouth daily. For diabetes 02/24/17   Reyne Dumas, MD  hydrALAZINE (APRESOLINE) 50 MG tablet Take 1 tablet (50 mg total) by mouth every 6 (six) hours. 02/24/17 03/26/17  Reyne Dumas, MD  isosorbide dinitrate (ISORDIL) 30 MG tablet Take 1 tablet (30 mg total) by mouth 3 (three) times daily. 02/24/17 03/26/17  Reyne Dumas, MD  latanoprost (XALATAN) 0.005 % ophthalmic solution Place 1 drop into both eyes at bedtime.    Historical Provider, MD  potassium chloride SA (K-DUR,KLOR-CON) 20 MEQ tablet Take 20 mEq by mouth 2 (two) times daily.    Historical Provider, MD    tamsulosin (FLOMAX) 0.4 MG CAPS capsule Take 0.4 mg by mouth daily. 01/16/17   Historical Provider, MD  vitamin B-12 (CYANOCOBALAMIN) 1000 MCG tablet Take 1,000 mcg by mouth daily.    Historical Provider, MD    Family History Family History  Problem Relation Age of Onset  . Dementia Mother   . Cancer Father   . Prostate cancer Brother   . Prostate cancer Brother     Social History Social History  Substance Use Topics  . Smoking status: Never Smoker  . Smokeless tobacco: Never Used  . Alcohol use Yes     Comment: occasionally     Allergies   Patient has no known allergies.   Review of Systems Review of Systems  Respiratory: Positive for shortness of breath.   All other systems reviewed and are negative.    Physical Exam Updated Vital Signs BP (!) 192/87 (BP Location: Right Arm)   Pulse 66   Temp (!) 101.6 F (38.7 C) (Oral)   Resp 16   SpO2 98%   Physical Exam  Constitutional: He is oriented to person, place, and time. He appears well-developed and well-nourished.  HENT:  Head: Normocephalic and atraumatic.  Cardiovascular: Normal rate and regular rhythm.   No murmur heard. Pulmonary/Chest: Effort normal. No respiratory distress.  Occasional end expiratory wheezes.    Abdominal: Soft. There is no tenderness. There is no rebound and no guarding.  Musculoskeletal: He exhibits no edema or tenderness.  Neurological: He is alert and oriented to person, place, and time.  Skin: Skin is warm and dry.  Psychiatric: He has a normal mood and affect. His behavior is normal.  Nursing note and vitals reviewed.    ED Treatments / Results  Labs (all labs ordered are listed, but only abnormal results are displayed) Labs Reviewed  CBC - Abnormal; Notable for the following:       Result Value   RBC 3.91 (*)    Hemoglobin 10.2 (*)    HCT 32.1 (*)    RDW 16.5 (*)    Platelets 145 (*)    All other components within normal limits  BASIC METABOLIC PANEL - Abnormal;  Notable for the following:    Creatinine, Ser 1.90 (*)    Calcium 8.6 (*)    GFR calc non Af Amer 31 (*)    GFR calc Af Amer 36 (*)    All other components within normal limits  I-STAT TROPOININ, ED - Abnormal; Notable for the following:    Troponin i, poc 0.11 (*)    All other components within normal limits  CULTURE, BLOOD (ROUTINE X 2)  CULTURE, BLOOD (ROUTINE X 2)  INFLUENZA PANEL BY PCR (TYPE A & B)  I-STAT CG4 LACTIC ACID, ED    EKG  EKG Interpretation  Date/Time:  Thursday March 05 2017 13:10:17 EDT  Ventricular Rate:  61 PR Interval:  148 QRS Duration: 100 QT Interval:  456 QTC Calculation: 459 R Axis:   101 Text Interpretation:  Normal sinus rhythm Rightward axis Cannot rule out Anterior infarct , age undetermined ST & T wave abnormality, consider lateral ischemia Abnormal ECG Confirmed by , Liz ([id number]) on 03/05/2017 1:19:07 PM       Radiology Dg Chest 2 View  Result Date: 03/05/2017 CLINICAL DATA:  Semi productive cough for 3 days now, headache, abd and chest pain as well, having a lot of SOB as well - hx of MI back in December with 2 stents and a defibrillator being placed - hx of multiple myeloma, CHF, htn, diabetes, CAD, kidney disease EXAM: CHEST  2 VIEW COMPARISON:  03/04/2017 FINDINGS: LEFT-sided pacemaker overlies stable cardiac silhouette. There is perihilar linear markings similar prior. No focal consolidation. No pneumothorax. Small effusions. IMPRESSION: 1. Chronic bronchitic markings. 2. Small effusions. 3. No overt pulmonary edema or pneumonia identified. Electronically Signed   By: Stewart  Edmunds M.D.   On: 03/05/2017 13:59   Dg Chest 2 View  Result Date: 03/04/2017 CLINICAL DATA:  Left-sided chest pain. EXAM: CHEST  2 VIEW COMPARISON:  02/21/2017 FINDINGS: The heart is borderline enlarged but stable. The mediastinal and hilar contours are within normal limits and unchanged. Mild tortuosity of the thoracic aorta. The pacer wires are stable. The lungs  demonstrate chronic bronchitic changes but no acute infiltrates, edema or effusions. The bony thorax is intact. IMPRESSION: No acute cardiopulmonary findings.  Chronic lung changes. Electronically Signed   By: P.  Gallerani M.D.   On: 03/04/2017 17:17    Procedures Procedures (including critical care time)  Medications Ordered in ED Medications  aspirin chewable tablet 324 mg (not administered)  acetaminophen (TYLENOL) tablet 650 mg (650 mg Oral Given 03/05/17 1435)  oseltamivir (TAMIFLU) capsule 30 mg (30 mg Oral Given 03/05/17 1435)     Initial Impression / Assessment and Plan / ED Course  I have reviewed the triage vital signs and the nursing notes.  Pertinent labs & imaging results that were available during my care of the patient were reviewed by me and considered in my medical decision making (see chart for details).    Patient here for evaluation of cough, shortness of breath, body aches. Presentation is consistent with flulike illness, no evidence of acute pneumonia. Given his fever and symptoms will send blood cultures. Troponin is mildly elevated of unclear significance. Plan to admit for observation given his significant generalized weakness and ongoing symptoms.  Final Clinical Impressions(s) / ED Diagnoses   Final diagnoses:  None    New Prescriptions New Prescriptions   No medications on file      , MD 03/05/17 1513  

## 2017-03-05 NOTE — H&P (Signed)
[facility] History and Physical   Patient: David Guerra EQA:[id number]   PCP: Pcp Not In System DOB: 03-22-1933   DOA: 03/05/2017   DOS: 03/05/2017   DOS: the patient was seen and examined on4/10/2017  Patient coming from: The patient is coming from home.  Chief Complaint: cough and shortnes of breath  HPI: David Guerra is a 83 y.o. male with Past medical history of Ischemic cardiomyopathy, ESRD implant, COPD, multiple myeloma, CKD stage III, type II DM, HTN. Patient presented with complaints of 3 day history of cough, generalized body ache, headache. Cough has been persistent and he has been having difficulty sleeping. Patient was seen in the ER for this and was discharged home on supportive medication. Denies having any complains of chest pain other than cough. Also has some shortness of breath as well as wheezing. No nausea no vomiting. No diarrhea no constipation. Has not been eating at all for last 2 days, has not taken any of his medications today.  ED Course: Patient was given breathing treatment, no improvement in his breathing and continues to have cough and shortness of breath and was referred for admission.  At his baseline ambulates without any support And is independent for most of his ADL; manages his medication on his own.  Review of Systems: as mentioned in the history of present illness.  All other systems reviewed and are negative.  Past Medical History:  Diagnosis Date  . Acute on chronic systolic and diastolic heart failure, NYHA class 4 ([place]) 02/21/2017  . AICD (automatic cardioverter/defibrillator) present    [employer]  . Cancer ([place])    mulitple myeloma  . Chronic systolic CHF (congestive heart failure), NYHA class 3 ([place])   . CKD (chronic kidney disease), stage III   . Coronary artery disease   . Diabetes mellitus without complication ([place])   . Hypertension   . Myocardial infarction   . PUD (peptic ulcer disease)    Past Surgical  History:  Procedure Laterality Date  . APPENDECTOMY    . BACK SURGERY    . CARDIAC DEFIBRILLATOR PLACEMENT  11/2016   [employer]  . CORONARY ANGIOPLASTY WITH STENT PLACEMENT  11/2016   total of 2 stents, 1 in January, 1 in February, both in [facility] in [place], [place] History:  reports that he has never smoked. He has never used smokeless tobacco. He reports that he drinks alcohol. He reports that he does not use drugs.  No Known Allergies  Family History  Problem Relation Age of Onset  . Dementia Mother   . Cancer Father   . Prostate cancer Brother   . Prostate cancer Brother      Prior to Admission medications   Medication Sig Start Date End Date Taking? Authorizing Provider  allopurinol (ZYLOPRIM) 300 MG tablet Take 300 mg by mouth daily. For gout 01/16/17   Historical Provider, MD  amiodarone (PACERONE) 200 MG tablet Take 1 tablet (200 mg total) by mouth daily. 02/25/17   Reyne Dumas, MD  aspirin EC 81 MG tablet Take 81 mg by mouth daily. For heart    Historical Provider, MD  atorvastatin (LIPITOR) 80 MG tablet Take 40 mg by mouth at bedtime. For lowering cholesterol 01/16/17   Historical Provider, MD  benzonatate (TESSALON) 100 MG capsule Take 1 capsule (100 mg total) by mouth every 8 (eight) hours. 03/04/17   Jenny Reichmann, MD  carvedilol (COREG) 12.5 MG tablet Take  12.5 mg by mouth every 12 (twelve) hours. For heart 01/16/17   Historical Provider, MD  clopidogrel (PLAVIX) 75 MG tablet Take 75 mg by mouth daily. For blood thinning 01/29/17   Historical Provider, MD  fluticasone (FLONASE) 50 MCG/ACT nasal spray Place 1 spray into both nostrils at bedtime. 01/29/17   Historical Provider, MD  furosemide (LASIX) 20 MG tablet Take 3 tablets (60 mg total) by mouth 2 (two) times daily. 02/24/17 03/26/17  Reyne Dumas, MD  glipiZIDE (GLUCOTROL) 5 MG tablet Take 0.5 tablets (2.5 mg total) by mouth daily. For diabetes 02/24/17   Reyne Dumas, MD    hydrALAZINE (APRESOLINE) 50 MG tablet Take 1 tablet (50 mg total) by mouth every 6 (six) hours. 02/24/17 03/26/17  Reyne Dumas, MD  isosorbide dinitrate (ISORDIL) 30 MG tablet Take 1 tablet (30 mg total) by mouth 3 (three) times daily. 02/24/17 03/26/17  Reyne Dumas, MD  latanoprost (XALATAN) 0.005 % ophthalmic solution Place 1 drop into both eyes at bedtime.    Historical Provider, MD  potassium chloride SA (K-DUR,KLOR-CON) 20 MEQ tablet Take 20 mEq by mouth 2 (two) times daily.    Historical Provider, MD  tamsulosin (FLOMAX) 0.4 MG CAPS capsule Take 0.4 mg by mouth daily. 01/16/17   Historical Provider, MD  vitamin B-12 (CYANOCOBALAMIN) 1000 MCG tablet Take 1,000 mcg by mouth daily.    Historical Provider, MD    Physical Exam: Vitals:   03/05/17 1515 03/05/17 1530 03/05/17 1600 03/05/17 1615  BP: (!) 172/74 (!) 175/68 (!) 164/71 (!) 156/79  Pulse: 64 64 64 60  Resp: 20 16  (!) 21  Temp:      TempSrc:      SpO2: 99% 95% 98% 92%    General: Alert, Awake and Oriented to Time, Place and Person. Appear in moderate distress, affect appropriate Eyes: PERRL, Conjunctiva normal ENT: Oral Mucosa clear moist Neck: difficult to assess JVD, no Abnormal Mass Or lumps Cardiovascular: S1 and S2 Present, no Murmur, Peripheral Pulses Present Respiratory: Bilateral Air entry equal and Decreased, positive use of accessory muscle, basal Crackles, bilateral expiratory wheezes Abdomen: Bowel Sound present, Soft and no tenderness Skin: no redness, no Rash, no induration Extremities: bilateral Pedal edema, no calf tenderness Neurologic: Grossly no focal neuro deficit. Bilaterally Equal motor strength  Labs on Admission:  CBC:  Recent Labs Lab 03/04/17 1645 03/05/17 1315  WBC 6.0 8.1  HGB 9.7* 10.2*  HCT 31.3* 32.1*  MCV 83.0 82.1  PLT 152 494*   Basic Metabolic Panel:  Recent Labs Lab 03/04/17 1645 03/05/17 1315  NA 141 138  K 3.2* 3.5  CL 103 101  CO2 29 26  GLUCOSE 88 99  BUN 15 17   CREATININE 1.85* 1.90*  CALCIUM 8.7* 8.6*   GFR: Estimated Creatinine Clearance: 32.2 mL/min (A) (by C-G formula based on SCr of 1.9 mg/dL (H)). Liver Function Tests: No results for input(s): AST, ALT, ALKPHOS, BILITOT, PROT, ALBUMIN in the last 168 hours. No results for input(s): LIPASE, AMYLASE in the last 168 hours. No results for input(s): AMMONIA in the last 168 hours. Coagulation Profile: No results for input(s): INR, PROTIME in the last 168 hours. Cardiac Enzymes: No results for input(s): CKTOTAL, CKMB, CKMBINDEX, TROPONINI in the last 168 hours. BNP (last 3 results) No results for input(s): PROBNP in the last 8760 hours. HbA1C: No results for input(s): HGBA1C in the last 72 hours. CBG: No results for input(s): GLUCAP in the last 168 hours. Lipid Profile: No results for input(s):  CHOL, HDL, LDLCALC, TRIG, CHOLHDL, LDLDIRECT in the last 72 hours. Thyroid Function Tests: No results for input(s): TSH, T4TOTAL, FREET4, T3FREE, THYROIDAB in the last 72 hours. Anemia Panel: No results for input(s): VITAMINB12, FOLATE, FERRITIN, TIBC, IRON, RETICCTPCT in the last 72 hours. Urine analysis: No results found for: COLORURINE, APPEARANCEUR, LABSPEC, [place], GLUCOSEU, HGBUR, BILIRUBINUR, KETONESUR, PROTEINUR, UROBILINOGEN, NITRITE, LEUKOCYTESUR  Radiological Exams on Admission: Dg Chest 2 View  Result Date: 03/05/2017 CLINICAL DATA:  Semi productive cough for 3 days now, headache, abd and chest pain as well, having a lot of SOB as well - hx of MI back in December with 2 stents and a defibrillator being placed - hx of multiple myeloma, CHF, htn, diabetes, CAD, kidney disease EXAM: CHEST  2 VIEW COMPARISON:  03/04/2017 FINDINGS: LEFT-sided pacemaker overlies stable cardiac silhouette. There is perihilar linear markings similar prior. No focal consolidation. No pneumothorax. Small effusions. IMPRESSION: 1. Chronic bronchitic markings. 2. Small effusions. 3. No overt pulmonary edema or  pneumonia identified. Electronically Signed   By: Suzy Bouchard M.D.   On: 03/05/2017 13:59   Dg Chest 2 View  Result Date: 03/04/2017 CLINICAL DATA:  Left-sided chest pain. EXAM: CHEST  2 VIEW COMPARISON:  02/21/2017 FINDINGS: The heart is borderline enlarged but stable. The mediastinal and hilar contours are within normal limits and unchanged. Mild tortuosity of the thoracic aorta. The pacer wires are stable. The lungs demonstrate chronic bronchitic changes but no acute infiltrates, edema or effusions. The bony thorax is intact. IMPRESSION: No acute cardiopulmonary findings.  Chronic lung changes. Electronically Signed   By: Marijo Sanes M.D.   On: 03/04/2017 17:17   EKG: Independently reviewed. unchanged from previous tracings, normal sinus rhythm, nonspecific ST and T waves changes.  Assessment/Plan 1. Acute viral bronchiolitis. Bilateral wheezing as well as crackles on examination. I would place the patient on scheduled nebulizers, Mucinex, Tessalon Perles, oral prednisone. We will monitor. Influenza PCR negative.  2. Chronic combined CHF. Accelerated hypertension At the present mild volume overload, will continue home regimen. Likely noncompliance with medical regimen due to acute bronchiolitis. Continue home regimen with 81 mg aspirin, Coreg 12.5 mg twice a day, Plavix 75 mg, Lasix 60 mg twice a day, Isordil.  3. Type 2 diabetes mellitus. Continue sliding scale insulin as well as glipizide.  4. History of BPH.  continue Flomax.  5. Mild elevation of troponin. EKG unremarkable, no chest pain. At present is appears demand ischemia. Next I will monitor serial troponin. Patient is to follow up with his cardiology at [place].  Nutrition: cardiac diet DVT Prophylaxis: subcutaneous Heparin  Advance goals of care discussion: full code   Consults: none  Family Communication: no family was present at bedside, at the time of interview.  Disposition: Admitted as observation,  telemetry unit. Likely to be discharged home, in 1-2 day.  Author: Berle Mull, MD [facility] Pager: [phone] 03/05/2017  If 7PM-7AM, please contact night-coverage [email or web address] Password [email or web address]

## 2017-03-05 NOTE — ED Notes (Signed)
Pt wheeled into triage room 2 right after check in completed.

## 2017-03-05 NOTE — Progress Notes (Addendum)
Hydrocodone/Chlorpheniramine ordered for cough. Will continue to monitor.   Paiton Boultinghouse, RN

## 2017-03-05 NOTE — ED Triage Notes (Signed)
Pt has had shortness of breath for several days. Seen here yesterday for same and discharged. Pt hypertensive in triage but states he was unable to tolerate his BP meds at home. Cough present.

## 2017-03-05 NOTE — ED Notes (Signed)
Pt c/o headache, stomach ache, coughing, shortness of breath, -- started Tuesday-- has a fever now, has not been around anyone that has been sick.

## 2017-03-06 DIAGNOSIS — J219 Acute bronchiolitis, unspecified: Secondary | ICD-10-CM | POA: Diagnosis not present

## 2017-03-06 DIAGNOSIS — J209 Acute bronchitis, unspecified: Secondary | ICD-10-CM | POA: Diagnosis not present

## 2017-03-06 LAB — COMPREHENSIVE METABOLIC PANEL
ALK PHOS: 45 U/L (ref 38–126)
ALT: 61 U/L (ref 17–63)
AST: 52 U/L — ABNORMAL HIGH (ref 15–41)
Albumin: 3.1 g/dL — ABNORMAL LOW (ref 3.5–5.0)
Anion gap: 11 (ref 5–15)
BUN: 20 mg/dL (ref 6–20)
CALCIUM: 8.4 mg/dL — AB (ref 8.9–10.3)
CO2: 26 mmol/L (ref 22–32)
CREATININE: 1.97 mg/dL — AB (ref 0.61–1.24)
Chloride: 101 mmol/L (ref 101–111)
GFR calc non Af Amer: 30 mL/min — ABNORMAL LOW (ref >60)
GFR, EST AFRICAN AMERICAN: 35 mL/min — AB (ref >60)
Glucose, Bld: 138 mg/dL — ABNORMAL HIGH (ref 65–99)
Potassium: 3.7 mmol/L (ref 3.5–5.1)
SODIUM: 138 mmol/L (ref 135–145)
Total Bilirubin: 0.8 mg/dL (ref 0.3–1.2)
Total Protein: 6.3 g/dL — ABNORMAL LOW (ref 6.5–8.1)

## 2017-03-06 LAB — CBC
HCT: 28.9 % — ABNORMAL LOW (ref 39.0–52.0)
Hemoglobin: 9.4 g/dL — ABNORMAL LOW (ref 13.0–17.0)
MCH: 26.6 pg (ref 26.0–34.0)
MCHC: 32.5 g/dL (ref 30.0–36.0)
MCV: 81.6 fL (ref 78.0–100.0)
PLATELETS: 125 10*3/uL — AB (ref 150–400)
RBC: 3.54 MIL/uL — AB (ref 4.22–5.81)
RDW: 17.1 % — AB (ref 11.5–15.5)
WBC: 8.2 10*3/uL (ref 4.0–10.5)

## 2017-03-06 LAB — RESPIRATORY PANEL BY PCR
Adenovirus: NOT DETECTED
BORDETELLA PERTUSSIS-RVPCR: NOT DETECTED
CHLAMYDOPHILA PNEUMONIAE-RVPPCR: NOT DETECTED
CORONAVIRUS 229E-RVPPCR: NOT DETECTED
CORONAVIRUS HKU1-RVPPCR: NOT DETECTED
CORONAVIRUS OC43-RVPPCR: NOT DETECTED
Coronavirus NL63: NOT DETECTED
INFLUENZA A-RVPPCR: NOT DETECTED
INFLUENZA B-RVPPCR: NOT DETECTED
METAPNEUMOVIRUS-RVPPCR: NOT DETECTED
MYCOPLASMA PNEUMONIAE-RVPPCR: NOT DETECTED
PARAINFLUENZA VIRUS 2-RVPPCR: NOT DETECTED
Parainfluenza Virus 1: NOT DETECTED
Parainfluenza Virus 3: DETECTED — AB
Parainfluenza Virus 4: NOT DETECTED
Respiratory Syncytial Virus: NOT DETECTED
Rhinovirus / Enterovirus: NOT DETECTED

## 2017-03-06 LAB — MAGNESIUM: MAGNESIUM: 1.9 mg/dL (ref 1.7–2.4)

## 2017-03-06 LAB — TROPONIN I: TROPONIN I: 0.1 ng/mL — AB (ref <0.03)

## 2017-03-06 LAB — EXPECTORATED SPUTUM ASSESSMENT W GRAM STAIN, RFLX TO RESP C

## 2017-03-06 LAB — GLUCOSE, CAPILLARY
Glucose-Capillary: 153 mg/dL — ABNORMAL HIGH (ref 65–99)
Glucose-Capillary: 189 mg/dL — ABNORMAL HIGH (ref 65–99)

## 2017-03-06 LAB — EXPECTORATED SPUTUM ASSESSMENT W REFEX TO RESP CULTURE

## 2017-03-06 MED ORDER — MENTHOL 3 MG MT LOZG: 1.0000 | LOZENGE | OROMUCOSAL | 0 refills | Status: DC | PRN

## 2017-03-06 MED ORDER — GUAIFENESIN ER 600 MG PO TB12: 600.0000 mg | ORAL_TABLET | Freq: Two times a day (BID) | ORAL | 0 refills | Status: DC

## 2017-03-06 MED ORDER — ALBUTEROL SULFATE HFA 108 (90 BASE) MCG/ACT IN AERS: 2.0000 | INHALATION_SPRAY | Freq: Four times a day (QID) | RESPIRATORY_TRACT | 2 refills | Status: DC | PRN

## 2017-03-06 MED ORDER — MENTHOL 3 MG MT LOZG
1.0000 | LOZENGE | OROMUCOSAL | Status: DC | PRN
  Administered 2017-03-06: 3 mg via ORAL
  Filled 2017-03-06: qty 9

## 2017-03-06 MED ORDER — PREDNISONE 10 MG PO TABS: ORAL_TABLET | ORAL | 0 refills | Status: DC

## 2017-03-06 NOTE — Progress Notes (Deleted)
Orders received for pt discharge.  Discharge summary printed and reviewed with pt. O2 has been delivered by [place]. Explained the importance of keeping O2 at all times, and not to smoke around O2.  Pt stated that he will "not wear O2 at all times, that he is not an 83 year old man." Explained medication regimen, and pt had no further questions at this time.  IV removed and site remains clean, dry, intact.  Telemetry removed.  Pt in stable condition and awaiting transport.

## 2017-03-06 NOTE — Progress Notes (Signed)
Orders received for pt discharge.  Discharge summary printed and reviewed with pt.  Explained medication regimen, and pt had no further questions at this time.  IV removed and site remains clean, dry, intact.  Telemetry removed.  Pt in stable condition and awaiting transport. 

## 2017-03-08 LAB — CULTURE, RESPIRATORY W GRAM STAIN: Culture: NORMAL

## 2017-03-08 LAB — CULTURE, RESPIRATORY

## 2017-03-10 LAB — CULTURE, BLOOD (ROUTINE X 2)
CULTURE: NO GROWTH
Culture: NO GROWTH
Special Requests: ADEQUATE
Special Requests: ADEQUATE

## 2017-03-11 NOTE — Discharge Summary (Signed)
[facility] Discharge Summary   Patient: David Guerra SVX:[id number]   PCP: Pcp Not In System DOB: 21-Oct-1933   Date of admission: 03/05/2017   Date of discharge: 03/06/2017    Discharge Diagnoses:  Principal Problem:   Acute bronchitis Active Problems:   HTN (hypertension)   AICD (automatic cardioverter/defibrillator) present   CAD (coronary artery disease)   Diabetes ([place])   CKD (chronic kidney disease), stage III   Chronic combined systolic and diastolic CHF (congestive heart failure) ([place])  Admitted From: home Disposition:  home  Recommendations for Outpatient Follow-up:  1. Please follow up with PCP in 1 week, return to cardiology in 1-2 weeks.   Follow-up Information    PCP. Schedule an appointment as soon as possible for a visit in 1 week(s).        cardiology. Schedule an appointment as soon as possible for a visit in 1 month(s).          Diet recommendation: cardiac diet  Activity: The patient is advised to gradually reintroduce usual activities.  Discharge Condition: good  Code Status: full code  History of present illness: As per the H and P dictated on admission, "David Guerra is a 83 y.o. male with Past medical history of Ischemic cardiomyopathy, ESRD implant, COPD, multiple myeloma, CKD stage III, type II DM, HTN. Patient presented with complaints of 3 day history of cough, generalized body ache, headache. Cough has been persistent and he has been having difficulty sleeping. Patient was seen in the ER for this and was discharged home on supportive medication. Denies having any complains of chest pain other than cough. Also has some shortness of breath as well as wheezing. No nausea no vomiting. No diarrhea no constipation. Has not been eating at all for last 2 days, has not taken any of his medications today."  Hospital Course:  Summary of his active problems in the hospital is as following. 1. Acute viral bronchiolitis. Bilateral wheezing as well  as crackles on examination. I would place the patient on bronchodilators, Mucinex, Tessalon Perles, oral prednisone. Influenza PCR negative.  2. Chronic combined CHF. Accelerated hypertension At the present mild volume overload, will continue home regimen. Likely noncompliance with medical regimen due to acute bronchiolitis. Continue home regimen with 81 mg aspirin, Coreg 12.5 mg twice a day, Plavix 75 mg, Lasix 60 mg twice a day, Isordil.  3. Type 2 diabetes mellitus. Continue sliding scale insulin as well as glipizide.  4. History of BPH.  continue Flomax.  5. Mild elevation of troponin. EKG unremarkable, no chest pain. At present is appears demand ischemia. Next I will monitor serial troponin. Patient is to follow up with his cardiology at [place].  All other chronic medical condition were stable during the hospitalization.  Patient was ambulatory without any assistance. On the day of the discharge the patient's vitals were stable, and no other acute medical condition were reported by patient. the patient was felt safe to be discharge at home with family.  Procedures and Results:  none   Consultations:  none  DISCHARGE MEDICATION: Discharge Medication List as of 03/06/2017 10:36 AM    START taking these medications   Details  albuterol (PROVENTIL HFA;VENTOLIN HFA) 108 (90 Base) MCG/ACT inhaler Inhale 2 puffs into the lungs every 6 (six) hours as needed for wheezing or shortness of breath., Starting Fri 03/06/2017, Normal    guaiFENesin (MUCINEX) 600 MG 12 hr tablet Take 1 tablet (600 mg total) by mouth 2 (two) times daily., Starting  Fri 03/06/2017, Normal    menthol-cetylpyridinium (CEPACOL) 3 MG lozenge Take 1 lozenge (3 mg total) by mouth as needed for sore throat., Starting Fri 03/06/2017, Normal    predniSONE (DELTASONE) 10 MG tablet Take 84m daily for 2days,Take 235mdaily for 2days,Take 1020maily for 2days, then stop., Normal      CONTINUE these  medications which have NOT CHANGED   Details  allopurinol (ZYLOPRIM) 300 MG tablet Take 300 mg by mouth daily. For gout, Starting Fri 01/16/2017, Historical Med    amiodarone (PACERONE) 200 MG tablet Take 1 tablet (200 mg total) by mouth daily., Starting Wed 02/25/2017, Print    aspirin EC 81 MG tablet Take 81 mg by mouth daily. For heart, Historical Med    atorvastatin (LIPITOR) 80 MG tablet Take 40 mg by mouth at bedtime. For lowering cholesterol, Starting Fri 01/16/2017, Historical Med    benzonatate (TESSALON) 100 MG capsule Take 1 capsule (100 mg total) by mouth every 8 (eight) hours., Starting Wed 03/04/2017, Print    carvedilol (COREG) 12.5 MG tablet Take 12.5 mg by mouth every 12 (twelve) hours. For heart, Starting Fri 01/16/2017, Historical Med    clopidogrel (PLAVIX) 75 MG tablet Take 75 mg by mouth daily. For blood thinning, Starting Thu 01/29/2017, Historical Med    fluticasone (FLONASE) 50 MCG/ACT nasal spray Place 1 spray into both nostrils at bedtime., Starting Thu 01/29/2017, Historical Med    furosemide (LASIX) 20 MG tablet Take 3 tablets (60 mg total) by mouth 2 (two) times daily., Starting Tue 02/24/2017, Until Thu 03/26/2017, Print    glipiZIDE (GLUCOTROL) 5 MG tablet Take 0.5 tablets (2.5 mg total) by mouth daily. For diabetes, Starting Tue 02/24/2017, Normal    hydrALAZINE (APRESOLINE) 50 MG tablet Take 1 tablet (50 mg total) by mouth every 6 (six) hours., Starting Tue 02/24/2017, Until Thu 03/26/2017, Print    isosorbide dinitrate (ISORDIL) 30 MG tablet Take 1 tablet (30 mg total) by mouth 3 (three) times daily., Starting Tue 02/24/2017, Until Thu 03/26/2017, Print    latanoprost (XALATAN) 0.005 % ophthalmic solution Place 1 drop into both eyes at bedtime., Historical Med    potassium chloride SA (K-DUR,KLOR-CON) 20 MEQ tablet Take 20 mEq by mouth 2 (two) times daily., Historical Med    tamsulosin (FLOMAX) 0.4 MG CAPS capsule Take 0.4 mg by mouth daily., Starting Fri 01/16/2017,  Historical Med    vitamin B-12 (CYANOCOBALAMIN) 1000 MCG tablet Take 1,000 mcg by mouth daily., Historical Med       No Known Allergies Discharge Instructions    Diet - low sodium heart healthy    Complete by:  As directed    Discharge instructions    Complete by:  As directed    It is important that you read following instructions as well as go over your medication list with RN to help you understand your care after this hospitalization.  Discharge Instructions: Please follow-up with PCP in one week  Please request your primary care physician to go over all Hospital Tests and Procedure/Radiological results at the follow up,  Please get all Hospital records sent to your PCP by signing hospital release before you go home.   Do not take more than prescribed Pain, Sleep and Anxiety Medications. You were cared for by a hospitalist during your hospital stay. If you have any questions about your discharge medications or the care you received while you were in the hospital after you are discharged, you can call the unit and ask to speak  with the hospitalist on call if the hospitalist that took care of you is not available.  Once you are discharged, your primary care physician will handle any further medical issues. Please note that NO REFILLS for any discharge medications will be authorized once you are discharged, as it is imperative that you return to your primary care physician (or establish a relationship with a primary care physician if you do not have one) for your aftercare needs so that they can reassess your need for medications and monitor your lab values. You Must read complete instructions/literature along with all the possible adverse reactions/side effects for all the Medicines you take and that have been prescribed to you. Take any new Medicines after you have completely understood and accept all the possible adverse reactions/side effects. Wear Seat belts while driving. If you have  smoked or chewed Tobacco in the last 2 yrs please stop smoking and/or stop any Recreational drug use.   Increase activity slowly    Complete by:  As directed      Discharge Exam: Filed Weights   03/05/17 1751 03/06/17 0604  Weight: 87.7 kg (193 lb 4.8 oz) 85.6 kg (188 lb 12.8 oz)   Vitals:   03/06/17 0026 03/06/17 0604  BP: (!) 146/79 (!) 159/71  Pulse: 70 60  Resp: 20 18  Temp: (!) 100.8 F (38.2 C) 97.9 F (36.6 C)   General: Appear in no distress, no Rash; Oral Mucosa moist. Cardiovascular: S1 and S2 Present, no Murmur, no JVD Respiratory: Bilateral Air entry present and Clear to Auscultation, no Crackles, no wheezes Abdomen: Bowel Sound present, Soft and no tenderness Extremities: no Pedal edema, on calf tenderness Neurology: Grossly no focal neuro deficit.  The results of significant diagnostics from this hospitalization (including imaging, microbiology, ancillary and laboratory) are listed below for reference.    Significant Diagnostic Studies: Dg Chest 2 View  Result Date: 03/05/2017 CLINICAL DATA:  Semi productive cough for 3 days now, headache, abd and chest pain as well, having a lot of SOB as well - hx of MI back in December with 2 stents and a defibrillator being placed - hx of multiple myeloma, CHF, htn, diabetes, CAD, kidney disease EXAM: CHEST  2 VIEW COMPARISON:  03/04/2017 FINDINGS: LEFT-sided pacemaker overlies stable cardiac silhouette. There is perihilar linear markings similar prior. No focal consolidation. No pneumothorax. Small effusions. IMPRESSION: 1. Chronic bronchitic markings. 2. Small effusions. 3. No overt pulmonary edema or pneumonia identified. Electronically Signed   By: Suzy Bouchard M.D.   On: 03/05/2017 13:59   Dg Chest 2 View  Result Date: 03/04/2017 CLINICAL DATA:  Left-sided chest pain. EXAM: CHEST  2 VIEW COMPARISON:  02/21/2017 FINDINGS: The heart is borderline enlarged but stable. The mediastinal and hilar contours are within normal  limits and unchanged. Mild tortuosity of the thoracic aorta. The pacer wires are stable. The lungs demonstrate chronic bronchitic changes but no acute infiltrates, edema or effusions. The bony thorax is intact. IMPRESSION: No acute cardiopulmonary findings.  Chronic lung changes. Electronically Signed   By: Marijo Sanes M.D.   On: 03/04/2017 17:17   Dg Chest 2 View  Result Date: 02/21/2017 CLINICAL DATA:  Acute onset of shortness of breath and generalized chest pain. Nausea and headache. Initial encounter. EXAM: CHEST  2 VIEW COMPARISON:  Chest radiograph performed 06/01/2013 FINDINGS: The lungs are well-aerated. Vascular congestion is noted. Increased interstitial markings raise question for mild interstitial edema. There is no evidence of pleural effusion or pneumothorax. The heart  is mildly enlarged. A pacemaker/AICD is noted at the left chest wall, with leads ending at the right atrium and right ventricle. No acute osseous abnormalities are seen. IMPRESSION: Vascular congestion and mild cardiomegaly. Increased interstitial markings raise question for mild interstitial edema. Electronically Signed   By: Garald Balding M.D.   On: 02/21/2017 20:25    Microbiology: Recent Results (from the past 240 hour(s))  Culture, blood (routine x 2)     Status: None   Collection Time: 03/05/17  2:31 PM  Result Value Ref Range Status   Specimen Description BLOOD RIGHT ANTECUBITAL  Final   Special Requests   Final    BOTTLES DRAWN AEROBIC AND ANAEROBIC Blood Culture adequate volume   Culture NO GROWTH 5 DAYS  Final   Report Status 03/10/2017 FINAL  Final  Culture, blood (routine x 2)     Status: None   Collection Time: 03/05/17  3:00 PM  Result Value Ref Range Status   Specimen Description BLOOD RIGHT HAND  Final   Special Requests   Final    BOTTLES DRAWN AEROBIC AND ANAEROBIC Blood Culture adequate volume   Culture NO GROWTH 5 DAYS  Final   Report Status 03/10/2017 FINAL  Final  Culture,  sputum-assessment     Status: None   Collection Time: 03/05/17  6:42 PM  Result Value Ref Range Status   Specimen Description SPUTUM  Final   Special Requests NONE  Final   Sputum evaluation THIS SPECIMEN IS ACCEPTABLE FOR SPUTUM CULTURE  Final   Report Status 03/06/2017 FINAL  Final  Respiratory Panel by PCR     Status: Abnormal   Collection Time: 03/05/17  6:42 PM  Result Value Ref Range Status   Adenovirus NOT DETECTED NOT DETECTED Final   Coronavirus 229E NOT DETECTED NOT DETECTED Final   Coronavirus HKU1 NOT DETECTED NOT DETECTED Final   Coronavirus NL63 NOT DETECTED NOT DETECTED Final   Coronavirus OC43 NOT DETECTED NOT DETECTED Final   Metapneumovirus NOT DETECTED NOT DETECTED Final   Rhinovirus / Enterovirus NOT DETECTED NOT DETECTED Final   Influenza A NOT DETECTED NOT DETECTED Final   Influenza B NOT DETECTED NOT DETECTED Final   Parainfluenza Virus 1 NOT DETECTED NOT DETECTED Final   Parainfluenza Virus 2 NOT DETECTED NOT DETECTED Final   Parainfluenza Virus 3 DETECTED (A) NOT DETECTED Final   Parainfluenza Virus 4 NOT DETECTED NOT DETECTED Final   Respiratory Syncytial Virus NOT DETECTED NOT DETECTED Final   Bordetella pertussis NOT DETECTED NOT DETECTED Final   Chlamydophila pneumoniae NOT DETECTED NOT DETECTED Final   Mycoplasma pneumoniae NOT DETECTED NOT DETECTED Final  Culture, respiratory (NON-Expectorated)     Status: None   Collection Time: 03/05/17  6:42 PM  Result Value Ref Range Status   Specimen Description SPUTUM  Final   Special Requests NONE Reflexed from [id number]  Final   Gram Stain   Final    MODERATE WBC PRESENT,BOTH PMN AND MONONUCLEAR FEW GRAM POSITIVE COCCI IN PAIRS IN CHAINS    Culture Consistent with normal respiratory flora.  Final   Report Status 03/08/2017 FINAL  Final     Labs: CBC:  Recent Labs Lab 03/05/17 1315 03/06/17 0534  WBC 8.1 8.2  HGB 10.2* 9.4*  HCT 32.1* 28.9*  MCV 82.1 81.6  PLT 145* 111*   Basic Metabolic  Panel:  Recent Labs Lab 03/05/17 1315 03/06/17 0534  NA 138 138  K 3.5 3.7  CL 101 101  CO2 26 26  GLUCOSE 99 138*  BUN 17 20  CREATININE 1.90* 1.97*  CALCIUM 8.6* 8.4*  MG  --  1.9   Liver Function Tests:  Recent Labs Lab 03/06/17 0534  AST 52*  ALT 61  ALKPHOS 45  BILITOT 0.8  PROT 6.3*  ALBUMIN 3.1*   No results for input(s): LIPASE, AMYLASE in the last 168 hours. No results for input(s): AMMONIA in the last 168 hours. Cardiac Enzymes:  Recent Labs Lab 03/05/17 1821 03/05/17 2313 03/06/17 0534  TROPONINI 0.11* 0.09* 0.10*   BNP (last 3 results)  Recent Labs  02/21/17 2027 02/22/17 0423  BNP 941.9* 1,024.3*   CBG:  Recent Labs Lab 03/05/17 2111 03/06/17 0734 03/06/17 1117  GLUCAP 107* 153* 189*   Time spent: 35 minutes  Signed:  Pang Robers  [facility] 03/06/2017, 10:40 PM

## 2017-03-26 ENCOUNTER — Emergency Department (HOSPITAL_COMMUNITY): Payer: Medicare Other

## 2017-03-26 ENCOUNTER — Encounter (HOSPITAL_COMMUNITY): Payer: Self-pay | Admitting: Internal Medicine

## 2017-03-26 ENCOUNTER — Inpatient Hospital Stay (HOSPITAL_COMMUNITY)
Admission: EM | Admit: 2017-03-26 | Discharge: 2017-03-30 | DRG: 313 | Disposition: A | Discharge status: Home / Self Care | Payer: Medicare Other | Attending: Internal Medicine | Admitting: Internal Medicine

## 2017-03-26 DIAGNOSIS — I5043 Acute on chronic combined systolic (congestive) and diastolic (congestive) heart failure: Secondary | ICD-10-CM | POA: Diagnosis present

## 2017-03-26 DIAGNOSIS — Z8042 Family history of malignant neoplasm of prostate: Secondary | ICD-10-CM

## 2017-03-26 DIAGNOSIS — D696 Thrombocytopenia, unspecified: Secondary | ICD-10-CM | POA: Diagnosis present

## 2017-03-26 DIAGNOSIS — I10 Essential (primary) hypertension: Secondary | ICD-10-CM | POA: Diagnosis not present

## 2017-03-26 DIAGNOSIS — Z95810 Presence of automatic (implantable) cardiac defibrillator: Secondary | ICD-10-CM | POA: Diagnosis present

## 2017-03-26 DIAGNOSIS — Z7982 Long term (current) use of aspirin: Secondary | ICD-10-CM

## 2017-03-26 DIAGNOSIS — R079 Chest pain, unspecified: Secondary | ICD-10-CM | POA: Diagnosis present

## 2017-03-26 DIAGNOSIS — Z7902 Long term (current) use of antithrombotics/antiplatelets: Secondary | ICD-10-CM

## 2017-03-26 DIAGNOSIS — E876 Hypokalemia: Secondary | ICD-10-CM | POA: Diagnosis present

## 2017-03-26 DIAGNOSIS — E1122 Type 2 diabetes mellitus with diabetic chronic kidney disease: Secondary | ICD-10-CM

## 2017-03-26 DIAGNOSIS — Z7951 Long term (current) use of inhaled steroids: Secondary | ICD-10-CM

## 2017-03-26 DIAGNOSIS — E0800 Diabetes mellitus due to underlying condition with hyperosmolarity without nonketotic hyperglycemic-hyperosmolar coma (NKHHC): Secondary | ICD-10-CM | POA: Diagnosis not present

## 2017-03-26 DIAGNOSIS — N183 Chronic kidney disease, stage 3 unspecified: Secondary | ICD-10-CM | POA: Diagnosis present

## 2017-03-26 DIAGNOSIS — N179 Acute kidney failure, unspecified: Secondary | ICD-10-CM | POA: Diagnosis not present

## 2017-03-26 DIAGNOSIS — I5042 Chronic combined systolic (congestive) and diastolic (congestive) heart failure: Secondary | ICD-10-CM | POA: Diagnosis present

## 2017-03-26 DIAGNOSIS — R0789 Other chest pain: Principal | ICD-10-CM | POA: Diagnosis present

## 2017-03-26 DIAGNOSIS — I130 Hypertensive heart and chronic kidney disease with heart failure and stage 1 through stage 4 chronic kidney disease, or unspecified chronic kidney disease: Secondary | ICD-10-CM | POA: Diagnosis present

## 2017-03-26 DIAGNOSIS — M4604 Spinal enthesopathy, thoracic region: Secondary | ICD-10-CM | POA: Diagnosis present

## 2017-03-26 DIAGNOSIS — I2510 Atherosclerotic heart disease of native coronary artery without angina pectoris: Secondary | ICD-10-CM | POA: Diagnosis not present

## 2017-03-26 DIAGNOSIS — Z8546 Personal history of malignant neoplasm of prostate: Secondary | ICD-10-CM

## 2017-03-26 DIAGNOSIS — Z79899 Other long term (current) drug therapy: Secondary | ICD-10-CM

## 2017-03-26 DIAGNOSIS — I4581 Long QT syndrome: Secondary | ICD-10-CM | POA: Diagnosis present

## 2017-03-26 DIAGNOSIS — I252 Old myocardial infarction: Secondary | ICD-10-CM

## 2017-03-26 DIAGNOSIS — Z955 Presence of coronary angioplasty implant and graft: Secondary | ICD-10-CM

## 2017-03-26 DIAGNOSIS — D649 Anemia, unspecified: Secondary | ICD-10-CM | POA: Diagnosis present

## 2017-03-26 DIAGNOSIS — E785 Hyperlipidemia, unspecified: Secondary | ICD-10-CM | POA: Diagnosis present

## 2017-03-26 DIAGNOSIS — Z8711 Personal history of peptic ulcer disease: Secondary | ICD-10-CM

## 2017-03-26 DIAGNOSIS — Z8674 Personal history of sudden cardiac arrest: Secondary | ICD-10-CM

## 2017-03-26 DIAGNOSIS — C9000 Multiple myeloma not having achieved remission: Secondary | ICD-10-CM | POA: Diagnosis present

## 2017-03-26 DIAGNOSIS — D631 Anemia in chronic kidney disease: Secondary | ICD-10-CM | POA: Diagnosis present

## 2017-03-26 DIAGNOSIS — Z7984 Long term (current) use of oral hypoglycemic drugs: Secondary | ICD-10-CM

## 2017-03-26 DIAGNOSIS — I255 Ischemic cardiomyopathy: Secondary | ICD-10-CM | POA: Diagnosis present

## 2017-03-26 DIAGNOSIS — E1100 Type 2 diabetes mellitus with hyperosmolarity without nonketotic hyperglycemic-hyperosmolar coma (NKHHC): Secondary | ICD-10-CM

## 2017-03-26 DIAGNOSIS — J449 Chronic obstructive pulmonary disease, unspecified: Secondary | ICD-10-CM | POA: Diagnosis present

## 2017-03-26 HISTORY — DX: Malignant neoplasm of prostate: C61

## 2017-03-26 HISTORY — DX: Multiple myeloma not having achieved remission: C90.00

## 2017-03-26 LAB — BASIC METABOLIC PANEL
ANION GAP: 3 — AB (ref 5–15)
BUN: 28 mg/dL — AB (ref 6–20)
CHLORIDE: 111 mmol/L (ref 101–111)
CO2: 27 mmol/L (ref 22–32)
Calcium: 8.3 mg/dL — ABNORMAL LOW (ref 8.9–10.3)
Creatinine, Ser: 2.12 mg/dL — ABNORMAL HIGH (ref 0.61–1.24)
GFR calc Af Amer: 32 mL/min — ABNORMAL LOW (ref >60)
GFR calc non Af Amer: 27 mL/min — ABNORMAL LOW (ref >60)
GLUCOSE: 80 mg/dL (ref 65–99)
POTASSIUM: 4.1 mmol/L (ref 3.5–5.1)
Sodium: 141 mmol/L (ref 135–145)

## 2017-03-26 LAB — CBC
HCT: 25.8 % — ABNORMAL LOW (ref 39.0–52.0)
Hemoglobin: 8.3 g/dL — ABNORMAL LOW (ref 13.0–17.0)
MCH: 27.3 pg (ref 26.0–34.0)
MCHC: 32.2 g/dL (ref 30.0–36.0)
MCV: 84.9 fL (ref 78.0–100.0)
PLATELETS: 153 10*3/uL (ref 150–400)
RBC: 3.04 MIL/uL — ABNORMAL LOW (ref 4.22–5.81)
RDW: 17.8 % — AB (ref 11.5–15.5)
WBC: 5.2 10*3/uL (ref 4.0–10.5)

## 2017-03-26 LAB — BRAIN NATRIURETIC PEPTIDE: B NATRIURETIC PEPTIDE 5: 1148 pg/mL — AB (ref 0.0–100.0)

## 2017-03-26 LAB — I-STAT TROPONIN, ED
TROPONIN I, POC: 0.03 ng/mL (ref 0.00–0.08)
TROPONIN I, POC: 0.03 ng/mL (ref 0.00–0.08)

## 2017-03-26 LAB — CBG MONITORING, ED
GLUCOSE-CAPILLARY: 68 mg/dL (ref 65–99)
GLUCOSE-CAPILLARY: 107 mg/dL — AB (ref 65–99)

## 2017-03-26 LAB — GLUCOSE, CAPILLARY: GLUCOSE-CAPILLARY: 95 mg/dL (ref 65–99)

## 2017-03-26 MED ORDER — ENOXAPARIN SODIUM 30 MG/0.3ML ~~LOC~~ SOLN
30.0000 mg | Freq: Every day | SUBCUTANEOUS | Status: DC
  Administered 2017-03-26 – 2017-03-29 (×4): 30 mg via SUBCUTANEOUS
  Filled 2017-03-26 (×4): qty 0.3

## 2017-03-26 MED ORDER — MORPHINE SULFATE (PF) 2 MG/ML IV SOLN: 2.0000 mg | INTRAVENOUS | Status: DC | PRN

## 2017-03-26 MED ORDER — ACETAMINOPHEN 325 MG PO TABS: 650.0000 mg | ORAL_TABLET | ORAL | Status: DC | PRN

## 2017-03-26 MED ORDER — FUROSEMIDE 10 MG/ML IJ SOLN
60.0000 mg | Freq: Once | INTRAMUSCULAR | Status: AC
  Administered 2017-03-26: 60 mg via INTRAVENOUS
  Filled 2017-03-26: qty 6

## 2017-03-26 MED ORDER — ASPIRIN EC 81 MG PO TBEC
81.0000 mg | DELAYED_RELEASE_TABLET | Freq: Every day | ORAL | Status: DC
  Administered 2017-03-27 – 2017-03-30 (×4): 81 mg via ORAL
  Filled 2017-03-26 (×4): qty 1

## 2017-03-26 MED ORDER — TAMSULOSIN HCL 0.4 MG PO CAPS
0.4000 mg | ORAL_CAPSULE | Freq: Every day | ORAL | Status: DC
  Administered 2017-03-27 – 2017-03-30 (×4): 0.4 mg via ORAL
  Filled 2017-03-26 (×4): qty 1

## 2017-03-26 MED ORDER — FUROSEMIDE 40 MG PO TABS
60.0000 mg | ORAL_TABLET | Freq: Two times a day (BID) | ORAL | Status: DC
  Administered 2017-03-27 – 2017-03-28 (×4): 60 mg via ORAL
  Filled 2017-03-26 (×4): qty 1

## 2017-03-26 MED ORDER — NITROGLYCERIN 0.4 MG SL SUBL
0.4000 mg | SUBLINGUAL_TABLET | SUBLINGUAL | Status: DC | PRN
  Administered 2017-03-26: 0.4 mg via SUBLINGUAL
  Filled 2017-03-26: qty 1

## 2017-03-26 MED ORDER — ONDANSETRON HCL 4 MG/2ML IJ SOLN: 4.0000 mg | Freq: Four times a day (QID) | INTRAMUSCULAR | Status: DC | PRN

## 2017-03-26 MED ORDER — CARVEDILOL 12.5 MG PO TABS
12.5000 mg | ORAL_TABLET | Freq: Two times a day (BID) | ORAL | Status: DC
  Administered 2017-03-27 – 2017-03-30 (×7): 12.5 mg via ORAL
  Filled 2017-03-26 (×8): qty 1

## 2017-03-26 MED ORDER — VITAMIN B-12 1000 MCG PO TABS
1000.0000 ug | ORAL_TABLET | Freq: Every day | ORAL | Status: DC
  Administered 2017-03-27 – 2017-03-30 (×4): 1000 ug via ORAL
  Filled 2017-03-26 (×4): qty 1

## 2017-03-26 MED ORDER — ISOSORBIDE DINITRATE 10 MG PO TABS
30.0000 mg | ORAL_TABLET | Freq: Three times a day (TID) | ORAL | Status: DC
  Administered 2017-03-26 – 2017-03-30 (×11): 30 mg via ORAL
  Filled 2017-03-26 (×11): qty 3

## 2017-03-26 MED ORDER — GI COCKTAIL ~~LOC~~: 30.0000 mL | Freq: Four times a day (QID) | ORAL | Status: DC | PRN

## 2017-03-26 MED ORDER — ASPIRIN 81 MG PO CHEW
324.0000 mg | CHEWABLE_TABLET | Freq: Once | ORAL | Status: DC
  Filled 2017-03-26: qty 4

## 2017-03-26 MED ORDER — POTASSIUM CHLORIDE CRYS ER 20 MEQ PO TBCR
20.0000 meq | EXTENDED_RELEASE_TABLET | Freq: Two times a day (BID) | ORAL | Status: DC
  Administered 2017-03-26 – 2017-03-30 (×8): 20 meq via ORAL
  Filled 2017-03-26 (×8): qty 1

## 2017-03-26 MED ORDER — FLUTICASONE PROPIONATE 50 MCG/ACT NA SUSP
1.0000 | Freq: Every day | NASAL | Status: DC
  Administered 2017-03-27 – 2017-03-29 (×3): 1 via NASAL
  Filled 2017-03-26: qty 16

## 2017-03-26 MED ORDER — INSULIN ASPART 100 UNIT/ML ~~LOC~~ SOLN: 0.0000 [IU] | Freq: Three times a day (TID) | SUBCUTANEOUS | Status: DC

## 2017-03-26 MED ORDER — CLOPIDOGREL BISULFATE 75 MG PO TABS
75.0000 mg | ORAL_TABLET | Freq: Every day | ORAL | Status: DC
  Administered 2017-03-27 – 2017-03-30 (×4): 75 mg via ORAL
  Filled 2017-03-26 (×4): qty 1

## 2017-03-26 MED ORDER — ATORVASTATIN CALCIUM 40 MG PO TABS
40.0000 mg | ORAL_TABLET | Freq: Every day | ORAL | Status: DC
  Administered 2017-03-26 – 2017-03-29 (×4): 40 mg via ORAL
  Filled 2017-03-26 (×4): qty 1

## 2017-03-26 MED ORDER — ALBUTEROL SULFATE (2.5 MG/3ML) 0.083% IN NEBU: 2.5000 mg | INHALATION_SOLUTION | Freq: Four times a day (QID) | RESPIRATORY_TRACT | Status: DC | PRN

## 2017-03-26 MED ORDER — HYDRALAZINE HCL 50 MG PO TABS
50.0000 mg | ORAL_TABLET | Freq: Four times a day (QID) | ORAL | Status: DC
  Administered 2017-03-26 – 2017-03-30 (×15): 50 mg via ORAL
  Filled 2017-03-26 (×15): qty 1

## 2017-03-26 MED ORDER — AMIODARONE HCL 200 MG PO TABS
200.0000 mg | ORAL_TABLET | Freq: Every day | ORAL | Status: DC
  Administered 2017-03-27 – 2017-03-30 (×4): 200 mg via ORAL
  Filled 2017-03-26 (×4): qty 1

## 2017-03-26 MED ORDER — LATANOPROST 0.005 % OP SOLN
1.0000 [drp] | Freq: Every day | OPHTHALMIC | Status: DC
  Administered 2017-03-26 – 2017-03-29 (×4): 1 [drp] via OPHTHALMIC
  Filled 2017-03-26: qty 2.5

## 2017-03-26 NOTE — ED Notes (Signed)
cbg was 68

## 2017-03-26 NOTE — H&P (Signed)
History and Physical    David Guerra GMW:[id number] DOB: 1932-12-15 DOA: 03/26/2017  PCP: Pcp Not In System - [place] doctor in [place] Consultants: Cardiology in [place] Patient coming from: visiting his daughter from [place]; Loyal: daughter, [phone]  Chief Complaint: chest pain  HPI: David Guerra is a 83 y.o. male with medical history significant of Ischemic cardiomyopathy, ESRD implant, COPD, multiple myeloma, CKDstage III, type II DM, HTN presenting with chest pain.  Patient woke up this AM feeling lousy.  Chest started hurting aobut 1130.  Went for a walk, came back and was resting and it started hurting.  Left sided chest pain.  Has h/o MI with 2 stents and defibrillator.  Pain was similar to index symptoms with prior MI.  Pain started before his walk, walked about 1/2 mile, rested afterwards and it became hard for him to breathe.  Not nauseated.  Felt lousy, kind of nervous.     ED Course:   Cardiology consult, seen by Dr. Harl Bowie.  No EKG changes, troponin negative  Review of Systems: As per HPI; otherwise review of systems reviewed and negative.   Ambulatory Status:  Ambulates without help in the house but uses a cane outside of the house  Past Medical History:  Diagnosis Date  . Acute on chronic systolic and diastolic heart failure, NYHA class 4 ([place]) 02/21/2017  . AICD (automatic cardioverter/defibrillator) present    [employer]  . Cancer ([place])    mulitple myeloma  . Chronic systolic CHF (congestive heart failure), NYHA class 3 ([place])   . CKD (chronic kidney disease), stage III   . Coronary artery disease   . Diabetes mellitus without complication ([place])   . Hypertension   . Multiple myeloma ([place])    "the doctor says it's resting"  . Myocardial infarction ([place])   . Prostate cancer ([place])   . PUD (peptic ulcer disease)     Past Surgical History:  Procedure Laterality Date  . APPENDECTOMY    . BACK SURGERY  1999  . CARDIAC DEFIBRILLATOR PLACEMENT  11/2016   [employer]    . CORONARY ANGIOPLASTY WITH STENT PLACEMENT  11/2016   total of 2 stents, 1 in January, 1 in February, both in [facility] in [place], [place]    Social History   Social History  . Marital status: Single    Spouse name: N/A  . Number of children: N/A  . Years of education: N/A   Occupational History  . retired    Social History Main Topics  . Smoking status: Never Smoker  . Smokeless tobacco: Never Used  . Alcohol use Yes     Comment: occasionally  . Drug use: No  . Sexual activity: Not on file   Other Topics Concern  . Not on file   Social History Narrative  . No narrative on file    No Known Allergies  Family History  Problem Relation Age of Onset  . Dementia Mother   . Cancer Father   . Prostate cancer Brother   . Prostate cancer Brother     Prior to Admission medications   Medication Sig Start Date End Date Taking? Authorizing Provider  albuterol (PROVENTIL HFA;VENTOLIN HFA) 108 (90 Base) MCG/ACT inhaler Inhale 2 puffs into the lungs every 6 (six) hours as needed for wheezing or shortness of breath. 03/06/17   Lavina Hamman, MD  allopurinol (ZYLOPRIM) 300 MG tablet Take 300 mg by mouth daily. For gout 01/16/17  Historical Provider, MD  amiodarone (PACERONE) 200 MG tablet Take 1 tablet (200 mg total) by mouth daily. 02/25/17   Reyne Dumas, MD  aspirin EC 81 MG tablet Take 81 mg by mouth daily. For heart    Historical Provider, MD  atorvastatin (LIPITOR) 80 MG tablet Take 40 mg by mouth at bedtime. For lowering cholesterol 01/16/17   Historical Provider, MD  benzonatate (TESSALON) 100 MG capsule Take 1 capsule (100 mg total) by mouth every 8 (eight) hours. 03/04/17   Jenny Reichmann, MD  carvedilol (COREG) 12.5 MG tablet Take 12.5 mg by mouth every 12 (twelve) hours. For heart 01/16/17   Historical Provider, MD  clopidogrel (PLAVIX) 75 MG tablet Take 75 mg by mouth daily. For blood thinning 01/29/17   Historical Provider, MD   fluticasone (FLONASE) 50 MCG/ACT nasal spray Place 1 spray into both nostrils at bedtime. 01/29/17   Historical Provider, MD  furosemide (LASIX) 20 MG tablet Take 3 tablets (60 mg total) by mouth 2 (two) times daily. 02/24/17 03/26/17  Reyne Dumas, MD  glipiZIDE (GLUCOTROL) 5 MG tablet Take 0.5 tablets (2.5 mg total) by mouth daily. For diabetes 02/24/17   Reyne Dumas, MD  guaiFENesin (MUCINEX) 600 MG 12 hr tablet Take 1 tablet (600 mg total) by mouth 2 (two) times daily. 03/06/17   Lavina Hamman, MD  hydrALAZINE (APRESOLINE) 50 MG tablet Take 1 tablet (50 mg total) by mouth every 6 (six) hours. 02/24/17 03/26/17  Reyne Dumas, MD  isosorbide dinitrate (ISORDIL) 30 MG tablet Take 1 tablet (30 mg total) by mouth 3 (three) times daily. 02/24/17 03/26/17  Reyne Dumas, MD  latanoprost (XALATAN) 0.005 % ophthalmic solution Place 1 drop into both eyes at bedtime.    Historical Provider, MD  menthol-cetylpyridinium (CEPACOL) 3 MG lozenge Take 1 lozenge (3 mg total) by mouth as needed for sore throat. 03/06/17   Lavina Hamman, MD  potassium chloride SA (K-DUR,KLOR-CON) 20 MEQ tablet Take 20 mEq by mouth 2 (two) times daily.    Historical Provider, MD  predniSONE (DELTASONE) 10 MG tablet Take 74m daily for 2days,Take 259mdaily for 2days,Take 1078maily for 2days, then stop. 03/06/17   PraLavina HammanD  tamsulosin (FLOMAX) 0.4 MG CAPS capsule Take 0.4 mg by mouth daily. 01/16/17   Historical Provider, MD  vitamin B-12 (CYANOCOBALAMIN) 1000 MCG tablet Take 1,000 mcg by mouth daily.    Historical Provider, MD    Physical Exam: Vitals:   03/26/17 2130 03/26/17 2208 03/26/17 2322 03/27/17 0047  BP: (!) 173/78 (!) 179/81 (!) 172/86 (!) 157/81  Pulse: 60 (!) 59    Resp: 14 18    Temp:  97.7 F (36.5 C)    TempSrc:  Oral    SpO2: 100% 100%    Weight:  79 kg (174 lb 3.2 oz)    Height:  5' 8"  (1.727 m)       General: Appears calm and comfortable and is NAD Eyes:  PERRL, EOMI, normal lids, iris ENT:  grossly  normal hearing, lips & tongue, mmm Neck:  no LAD, masses or thyromegaly Cardiovascular:  RRR, no m/r/g. No LE edema.  Respiratory:  CTA bilaterally, no w/r/r. Normal respiratory effort. Abdomen:  soft, ntnd, NABS Skin:  no rash or induration seen on limited exam Musculoskeletal:  grossly normal tone BUE/BLE, good ROM, no bony abnormality Psychiatric:  grossly normal mood and affect, speech fluent and appropriate, AOx3 Neurologic:  CN 2-12 grossly intact, moves all extremities in coordinated fashion, sensation  intact  Labs on Admission: I have personally reviewed following labs and imaging studies  CBC:  Recent Labs Lab 03/26/17 1735  WBC 5.2  HGB 8.3*  HCT 25.8*  MCV 84.9  PLT 956   Basic Metabolic Panel:  Recent Labs Lab 03/26/17 1735  NA 141  K 4.1  CL 111  CO2 27  GLUCOSE 80  BUN 28*  CREATININE 2.12*  CALCIUM 8.3*   GFR: Estimated Creatinine Clearance: 26 mL/min (A) (by C-G formula based on SCr of 2.12 mg/dL (H)). Liver Function Tests: No results for input(s): AST, ALT, ALKPHOS, BILITOT, PROT, ALBUMIN in the last 168 hours. No results for input(s): LIPASE, AMYLASE in the last 168 hours. No results for input(s): AMMONIA in the last 168 hours. Coagulation Profile: No results for input(s): INR, PROTIME in the last 168 hours. Cardiac Enzymes:  Recent Labs Lab 03/26/17 2216  TROPONINI 0.03*   BNP (last 3 results) No results for input(s): PROBNP in the last 8760 hours. HbA1C: No results for input(s): HGBA1C in the last 72 hours. CBG:  Recent Labs Lab 03/26/17 1820 03/26/17 2145 03/26/17 2205  GLUCAP 68 107* 95   Lipid Profile: No results for input(s): CHOL, HDL, LDLCALC, TRIG, CHOLHDL, LDLDIRECT in the last 72 hours. Thyroid Function Tests: No results for input(s): TSH, T4TOTAL, FREET4, T3FREE, THYROIDAB in the last 72 hours. Anemia Panel: No results for input(s): VITAMINB12, FOLATE, FERRITIN, TIBC, IRON, RETICCTPCT in the last 72 hours. Urine  analysis: No results found for: COLORURINE, APPEARANCEUR, LABSPEC, PHURINE, GLUCOSEU, HGBUR, BILIRUBINUR, KETONESUR, PROTEINUR, UROBILINOGEN, NITRITE, LEUKOCYTESUR  Creatinine Clearance: Estimated Creatinine Clearance: 26 mL/min (A) (by C-G formula based on SCr of 2.12 mg/dL (H)).  Sepsis Labs: @LABRCNTIP (procalcitonin:4,lacticidven:4) )No results found for this or any previous visit (from the past 240 hour(s)).   Radiological Exams on Admission: Dg Chest 2 View  Result Date: 03/26/2017 CLINICAL DATA:  LEFT chest pain and shortness of breath, history of coronary artery disease post MI and coronary stenting, hypertension, diabetes mellitus, multiple myeloma EXAM: CHEST  2 VIEW COMPARISON:  03/05/2017 FINDINGS: LEFT subclavian transvenous pacemaker/AICD leads project at RIGHT atrium and RIGHT ventricle unchanged since previous exam. Enlargement of cardiac silhouette with pulmonary vascular congestion. Stable mediastinal contours. Chronic bronchitic changes with RIGHT basilar atelectasis. Question small RIGHT pleural effusion No acute infiltrate, LEFT pleural effusion or pneumothorax. Bones demineralized with endplate spur formation thoracic spine. IMPRESSION: Enlargement of cardiac silhouette with pulmonary vascular congestion. Bronchitic changes with RIGHT basilar atelectasis and suspect small RIGHT pleural effusion. No acute infiltrate. Electronically Signed   By: Lavonia Dana M.D.   On: 03/26/2017 17:48    EKG: Independently reviewed.  NSR with rate 58; prolonged PR interval, no evidence of acute ischemia, NSCSLT   Assessment/Plan Principal Problem:   Chest pain Active Problems:   HTN (hypertension)   AICD (automatic cardioverter/defibrillator) present   Diabetes ([place])   CKD (chronic kidney disease), stage III   Chronic combined systolic and diastolic CHF (congestive heart failure) ([facility])   Anemia   Chest pain -Patient with known h/o CAD presenting with index symptoms similar to prior  with substernal/left chest pressure that came on prior to or with exertion. -Symptoms concerning for cardiac/anginal chest pain.  -CXR unremarkable.   -Initial cardiac troponin negative x2, 3rd is borderline.  Continue to trend. -EKG not indicative of acute ischemia.   -Will plan to place in observation status on telemetry to rule out ACS by overnight observation.  -Repeat EKG in AM -Continue ASA 81 mg  Daily and Plavix as well as Isordil -morphine given -Cardiology consultation done - if he develops further chest pain overnight then cardiology should be called back. -No imaging since stents placed in 12/17.  HTN -Takes Coreg and Hydralazine at home -Patient with suboptimal control while in the ER -He may need an additional agent and does not appear to be taking ACE-I  HLD -Continue Lipitor  DM -On Glucotrol, will hold -Cover with SSI for now  CHF -BNP 1148.0, previously 1024.3 on 4/1 -Has AICD -Does not appear to be in acute exacerbation clinically at this time so will continue home Lasix without further IV Lasix currently  CKD -BUN 28, Creatinine 2.12, GFR 32 - stable  Anemia -Hgb 8.3, prior 9.4 on 4/13, 10.2 on 4/12 -Normocytic -May be related to CKD -Will order stool guaiac    DVT prophylaxis: Lovenox Code Status: Full - confirmed with patient/family Family Communication:  Daughter present throughout evaluation Disposition Plan:  Home once clinically improved Consults called: Cardiology  Admission status: It is my clinical opinion that referral for OBSERVATION is reasonable and necessary in this patient based on the above information provided. The aforementioned taken together are felt to place the patient at high risk for further clinical deterioration. However it is anticipated that the patient may be medically stable for discharge from the hospital within 24 to 48 hours.    Karmen Bongo MD [facility]  If 7PM-7AM, please contact  night-coverage [email or web address] Password [email or web address]  03/27/2017, 1:24 AM

## 2017-03-26 NOTE — ED Provider Notes (Signed)
[facility] DEPT Provider Note   CSN: [id number] Arrival date & time: 03/26/17  1708     History   Chief Complaint Chief Complaint  Patient presents with  . Chest Pain    HPI David Guerra is a 83 y.o. male.  83 yo M with a chief complaint of chest pain. Woke up with it this morning. Describes it as a pinpoint area that feels like a discomfort. Her like a pressure than sharp. Denies radiation. Patient felt generally lousy as well. Went for a walk without significant worsening of his symptoms but no improvement. Took 2 nitros with some improvement of his pain. Has a history of a recent MI this past December where he went into cardiac arrest and required shocks. Now has an implanted defibrillator. He thinks this pain feels exactly the same as when he had his MI. He feels that the pain before was worse.   The history is provided by the patient.  Chest Pain   This is a new problem. The current episode started 2 days ago. The problem occurs constantly. The problem has not changed since onset.The pain is present in the lateral region. The pain is at a severity of 8/10. The pain is severe. The quality of the pain is described as pressure-like and sharp. The pain does not radiate. Duration of episode(s) is 12 hours. Associated symptoms include headaches, nausea and shortness of breath. Pertinent negatives include no abdominal pain, no fever, no palpitations and no vomiting. He has tried nothing for the symptoms. The treatment provided no relief.  His past medical history is significant for MI.    Past Medical History:  Diagnosis Date  . Acute on chronic systolic and diastolic heart failure, NYHA class 4 ([place]) 02/21/2017  . AICD (automatic cardioverter/defibrillator) present    [employer]  . Cancer ([place])    mulitple myeloma  . Chronic systolic CHF (congestive heart failure), NYHA class 3 ([place])   . CKD (chronic kidney disease), stage III   . Coronary artery disease   . Diabetes  mellitus without complication ([place])   . Hypertension   . Multiple myeloma ([place])    "the doctor says it's resting"  . Myocardial infarction ([place])   . Prostate cancer ([place])   . PUD (peptic ulcer disease)     Patient Active Problem List   Diagnosis Date Noted  . Acute bronchitis 03/05/2017  . Chronic combined systolic and diastolic CHF (congestive heart failure) ([place]) 03/05/2017  . CKD (chronic kidney disease), stage III   . Chest pain 02/21/2017  . Acute on chronic systolic and diastolic heart failure, NYHA class 4 ([place]) 02/21/2017  . HTN (hypertension) 02/21/2017  . AICD (automatic cardioverter/defibrillator) present 02/21/2017  . CAD (coronary artery disease) 02/21/2017  . Diabetes ([place]) 02/21/2017    Past Surgical History:  Procedure Laterality Date  . APPENDECTOMY    . BACK SURGERY  1999  . CARDIAC DEFIBRILLATOR PLACEMENT  11/2016   [employer]  . CORONARY ANGIOPLASTY WITH STENT PLACEMENT  11/2016   total of 2 stents, 1 in January, 1 in February, both in [facility] in [place], [place] Medications    Prior to Admission medications   Medication Sig Start Date End Date Taking? Authorizing Provider  albuterol (PROVENTIL HFA;VENTOLIN HFA) 108 (90 Base) MCG/ACT inhaler Inhale 2 puffs into the lungs every 6 (six) hours as needed for wheezing or shortness of breath. 03/06/17  Lavina Hamman, MD  allopurinol (ZYLOPRIM) 300 MG tablet Take 300 mg by mouth daily. For gout 01/16/17   Historical Provider, MD  amiodarone (PACERONE) 200 MG tablet Take 1 tablet (200 mg total) by mouth daily. 02/25/17   Reyne Dumas, MD  aspirin EC 81 MG tablet Take 81 mg by mouth daily. For heart    Historical Provider, MD  atorvastatin (LIPITOR) 80 MG tablet Take 40 mg by mouth at bedtime. For lowering cholesterol 01/16/17   Historical Provider, MD  benzonatate (TESSALON) 100 MG capsule Take 1 capsule (100 mg total) by mouth every 8 (eight) hours.  03/04/17   Jenny Reichmann, MD  carvedilol (COREG) 12.5 MG tablet Take 12.5 mg by mouth every 12 (twelve) hours. For heart 01/16/17   Historical Provider, MD  clopidogrel (PLAVIX) 75 MG tablet Take 75 mg by mouth daily. For blood thinning 01/29/17   Historical Provider, MD  fluticasone (FLONASE) 50 MCG/ACT nasal spray Place 1 spray into both nostrils at bedtime. 01/29/17   Historical Provider, MD  furosemide (LASIX) 20 MG tablet Take 3 tablets (60 mg total) by mouth 2 (two) times daily. 02/24/17 03/26/17  Reyne Dumas, MD  glipiZIDE (GLUCOTROL) 5 MG tablet Take 0.5 tablets (2.5 mg total) by mouth daily. For diabetes 02/24/17   Reyne Dumas, MD  guaiFENesin (MUCINEX) 600 MG 12 hr tablet Take 1 tablet (600 mg total) by mouth 2 (two) times daily. 03/06/17   Lavina Hamman, MD  hydrALAZINE (APRESOLINE) 50 MG tablet Take 1 tablet (50 mg total) by mouth every 6 (six) hours. 02/24/17 03/26/17  Reyne Dumas, MD  isosorbide dinitrate (ISORDIL) 30 MG tablet Take 1 tablet (30 mg total) by mouth 3 (three) times daily. 02/24/17 03/26/17  Reyne Dumas, MD  latanoprost (XALATAN) 0.005 % ophthalmic solution Place 1 drop into both eyes at bedtime.    Historical Provider, MD  menthol-cetylpyridinium (CEPACOL) 3 MG lozenge Take 1 lozenge (3 mg total) by mouth as needed for sore throat. 03/06/17   Lavina Hamman, MD  potassium chloride SA (K-DUR,KLOR-CON) 20 MEQ tablet Take 20 mEq by mouth 2 (two) times daily.    Historical Provider, MD  predniSONE (DELTASONE) 10 MG tablet Take 52m daily for 2days,Take 225mdaily for 2days,Take 1053maily for 2days, then stop. 03/06/17   PraLavina HammanD  tamsulosin (FLOMAX) 0.4 MG CAPS capsule Take 0.4 mg by mouth daily. 01/16/17   Historical Provider, MD  vitamin B-12 (CYANOCOBALAMIN) 1000 MCG tablet Take 1,000 mcg by mouth daily.    Historical Provider, MD    Family History Family History  Problem Relation Age of Onset  . Dementia Mother   . Cancer Father   . Prostate cancer Brother   . Prostate  cancer Brother     Social History Social History  Substance Use Topics  . Smoking status: Never Smoker  . Smokeless tobacco: Never Used  . Alcohol use Yes     Comment: occasionally     Allergies   Patient has no known allergies.   Review of Systems Review of Systems  Constitutional: Negative for chills and fever.  HENT: Negative for congestion and facial swelling.   Eyes: Negative for discharge and visual disturbance.  Respiratory: Positive for shortness of breath.   Cardiovascular: Positive for chest pain. Negative for palpitations.  Gastrointestinal: Positive for nausea. Negative for abdominal pain, diarrhea and vomiting.  Musculoskeletal: Negative for arthralgias and myalgias.  Skin: Negative for color change and rash.  Neurological: Positive for headaches. Negative  for tremors and syncope.  Psychiatric/Behavioral: Negative for confusion and dysphoric mood.     Physical Exam Updated Vital Signs BP (!) 172/86   Pulse (!) 59   Temp 97.7 F (36.5 C) (Oral)   Resp 18   Ht 5' 8"  (1.727 m)   Wt 174 lb 3.2 oz (79 kg)   SpO2 100%   BMI 26.49 kg/m   Physical Exam  Constitutional: He is oriented to person, place, and time. He appears well-developed and well-nourished.  HENT:  Head: Normocephalic and atraumatic.  Eyes: EOM are normal. Pupils are equal, round, and reactive to light.  Neck: Normal range of motion. Neck supple. No JVD present.  Cardiovascular: Normal rate and regular rhythm.  Exam reveals no gallop and no friction rub.   No murmur heard. Pulmonary/Chest: No respiratory distress. He has no wheezes. He exhibits no tenderness.  Abdominal: He exhibits no distension and no mass. There is no tenderness. There is no rebound and no guarding.  Musculoskeletal: Normal range of motion.  Neurological: He is alert and oriented to person, place, and time.  Skin: No rash noted. No pallor.  Psychiatric: He has a normal mood and affect. His behavior is normal.  Nursing  note and vitals reviewed.    ED Treatments / Results  Labs (all labs ordered are listed, but only abnormal results are displayed) Labs Reviewed  BASIC METABOLIC PANEL - Abnormal; Notable for the following:       Result Value   BUN 28 (*)    Creatinine, Ser 2.12 (*)    Calcium 8.3 (*)    GFR calc non Af Amer 27 (*)    GFR calc Af Amer 32 (*)    Anion gap 3 (*)    All other components within normal limits  CBC - Abnormal; Notable for the following:    RBC 3.04 (*)    Hemoglobin 8.3 (*)    HCT 25.8 (*)    RDW 17.8 (*)    All other components within normal limits  CBG MONITORING, ED - Abnormal; Notable for the following:    Glucose-Capillary 107 (*)    All other components within normal limits  GLUCOSE, CAPILLARY  BRAIN NATRIURETIC PEPTIDE  TROPONIN I  TROPONIN I  TROPONIN I  HEMOGLOBIN A1C  CBG MONITORING, ED  I-STAT TROPOININ, ED  Randolm Idol, ED    EKG  EKG Interpretation  Date/Time:  Thursday Mar 26 2017 17:10:15 EDT Ventricular Rate:  58 PR Interval:    QRS Duration: 113 QT Interval:  492 QTC Calculation: 484 R Axis:   18 Text Interpretation:  Sinus rhythm Prolonged PR interval Borderline intraventricular conduction delay Borderline prolonged QT interval No significant change since last tracing Confirmed by Riannah Stagner MD, Quillian Quince [id number]) on 03/26/2017 5:17:31 PM       Radiology Dg Chest 2 View  Result Date: 03/26/2017 CLINICAL DATA:  LEFT chest pain and shortness of breath, history of coronary artery disease post MI and coronary stenting, hypertension, diabetes mellitus, multiple myeloma EXAM: CHEST  2 VIEW COMPARISON:  03/05/2017 FINDINGS: LEFT subclavian transvenous pacemaker/AICD leads project at RIGHT atrium and RIGHT ventricle unchanged since previous exam. Enlargement of cardiac silhouette with pulmonary vascular congestion. Stable mediastinal contours. Chronic bronchitic changes with RIGHT basilar atelectasis. Question small RIGHT pleural effusion No acute  infiltrate, LEFT pleural effusion or pneumothorax. Bones demineralized with endplate spur formation thoracic spine. IMPRESSION: Enlargement of cardiac silhouette with pulmonary vascular congestion. Bronchitic changes with RIGHT basilar atelectasis and suspect small RIGHT  pleural effusion. No acute infiltrate. Electronically Signed   By: Lavonia Dana M.D.   On: 03/26/2017 17:48    Procedures Procedures (including critical care time)  Medications Ordered in ED Medications  aspirin chewable tablet 324 mg (324 mg Oral Not Given 03/26/17 1821)  nitroGLYCERIN (NITROSTAT) SL tablet 0.4 mg (0.4 mg Sublingual Given 03/26/17 1811)  albuterol (PROVENTIL) (2.5 MG/3ML) 0.083% nebulizer solution 2.5 mg (not administered)  amiodarone (PACERONE) tablet 200 mg (not administered)  aspirin EC tablet 81 mg (not administered)  atorvastatin (LIPITOR) tablet 40 mg (40 mg Oral Given 03/26/17 2316)  carvedilol (COREG) tablet 12.5 mg (not administered)  clopidogrel (PLAVIX) tablet 75 mg (not administered)  fluticasone (FLONASE) 50 MCG/ACT nasal spray 1 spray (1 spray Each Nare Not Given 03/26/17 2230)  furosemide (LASIX) tablet 60 mg (not administered)  hydrALAZINE (APRESOLINE) tablet 50 mg (50 mg Oral Given 03/26/17 2322)  isosorbide dinitrate (ISORDIL) tablet 30 mg (30 mg Oral Given 03/26/17 2316)  latanoprost (XALATAN) 0.005 % ophthalmic solution 1 drop (1 drop Both Eyes Given 03/26/17 2317)  potassium chloride SA (K-DUR,KLOR-CON) CR tablet 20 mEq (20 mEq Oral Given 03/26/17 2316)  tamsulosin (FLOMAX) capsule 0.4 mg (not administered)  vitamin B-12 (CYANOCOBALAMIN) tablet 1,000 mcg (not administered)  enoxaparin (LOVENOX) injection 30 mg (30 mg Subcutaneous Given 03/26/17 2316)  morphine 2 MG/ML injection 2 mg (not administered)  gi cocktail (Maalox,Lidocaine,Donnatal) (not administered)  acetaminophen (TYLENOL) tablet 650 mg (not administered)  ondansetron (ZOFRAN) injection 4 mg (not administered)  insulin aspart (novoLOG)  injection 0-15 Units (not administered)  furosemide (LASIX) injection 60 mg (60 mg Intravenous Given 03/26/17 2018)     Initial Impression / Assessment and Plan / ED Course  I have reviewed the triage vital signs and the nursing notes.  Pertinent labs & imaging results that were available during my care of the patient were reviewed by me and considered in my medical decision making (see chart for details).     83 yo M With a chief complaint of chest pain. Going on for the past 12 hours. Feels like it's gotten progressively worse. Feels like his prior MI. EKG with no significant finding. He is given aspirin by EMS with give more nitroglycerin here.  Discussed the case with Dr. Harl Bowie, cardiology. As the patient has no EKG changes or positive troponin at this point his symptoms started about 8 hours ago he recommended hospitalist admit for observation. He will come and evaluate the patient.   The patients results and plan were reviewed and discussed.   Any x-rays performed were independently reviewed by myself.   Differential diagnosis were considered with the presenting HPI.  Medications  aspirin chewable tablet 324 mg (324 mg Oral Not Given 03/26/17 1821)  nitroGLYCERIN (NITROSTAT) SL tablet 0.4 mg (0.4 mg Sublingual Given 03/26/17 1811)  albuterol (PROVENTIL) (2.5 MG/3ML) 0.083% nebulizer solution 2.5 mg (not administered)  amiodarone (PACERONE) tablet 200 mg (not administered)  aspirin EC tablet 81 mg (not administered)  atorvastatin (LIPITOR) tablet 40 mg (40 mg Oral Given 03/26/17 2316)  carvedilol (COREG) tablet 12.5 mg (not administered)  clopidogrel (PLAVIX) tablet 75 mg (not administered)  fluticasone (FLONASE) 50 MCG/ACT nasal spray 1 spray (1 spray Each Nare Not Given 03/26/17 2230)  furosemide (LASIX) tablet 60 mg (not administered)  hydrALAZINE (APRESOLINE) tablet 50 mg (50 mg Oral Given 03/26/17 2322)  isosorbide dinitrate (ISORDIL) tablet 30 mg (30 mg Oral Given 03/26/17 2316)    latanoprost (XALATAN) 0.005 % ophthalmic solution 1 drop (1  drop Both Eyes Given 03/26/17 2317)  potassium chloride SA (K-DUR,KLOR-CON) CR tablet 20 mEq (20 mEq Oral Given 03/26/17 2316)  tamsulosin (FLOMAX) capsule 0.4 mg (not administered)  vitamin B-12 (CYANOCOBALAMIN) tablet 1,000 mcg (not administered)  enoxaparin (LOVENOX) injection 30 mg (30 mg Subcutaneous Given 03/26/17 2316)  morphine 2 MG/ML injection 2 mg (not administered)  gi cocktail (Maalox,Lidocaine,Donnatal) (not administered)  acetaminophen (TYLENOL) tablet 650 mg (not administered)  ondansetron (ZOFRAN) injection 4 mg (not administered)  insulin aspart (novoLOG) injection 0-15 Units (not administered)  furosemide (LASIX) injection 60 mg (60 mg Intravenous Given 03/26/17 2018)    Vitals:   03/26/17 2115 03/26/17 2130 03/26/17 2208 03/26/17 2322  BP:  (!) 173/78 (!) 179/81 (!) 172/86  Pulse: 60 60 (!) 59   Resp: 20 14 18    Temp:   97.7 F (36.5 C)   TempSrc:   Oral   SpO2: 100% 100% 100%   Weight:   174 lb 3.2 oz (79 kg)   Height:   5' 8"  (1.727 m)     Final diagnoses:  Chest pain, cardiac    Admission/ observation were discussed with the admitting physician, patient and/or family and they are comfortable with the plan.    Final Clinical Impressions(s) / ED Diagnoses   Final diagnoses:  Chest pain, cardiac    New Prescriptions Current Discharge Medication List       Deno Etienne, DO 03/26/17 2341

## 2017-03-26 NOTE — ED Notes (Signed)
Pt and family made aware of bed assignemnt

## 2017-03-26 NOTE — ED Notes (Signed)
Patient transported to X-ray 

## 2017-03-26 NOTE — Consult Note (Signed)
Primary cardiologist: Consulting cardiologist:  Clinical Summary David Guerra is a 83 y.o.male history of prior MI with stents x2 in 10/2016 in [place], chronic systolic HF LVEF 43-32% with AICD, DM2, HTN, HL, COPD, CKD III admitted with chest pain, fatigue, and sob. Multiple admissions over the last 2 months with SOB, volume overload, chest pain.   Reports symptoms started this AM. Felt fine last night, woke up this morning "feeling lousy". Had faitgue and weakness. Later in the morning developed chest pain starting around 11 AM. 8/10 dull pain left sided, +SOB, +palpitations. Pain has been constant since 11AM.    CXR mild edema K 4.1 Cr 2.12 Hgb 8.3  Topr neg x 1 EKG SR, no acute ischemic changes  No Known Allergies  Medications Scheduled Medications: . aspirin  324 mg Oral Once     Infusions:   PRN Medications:  nitroGLYCERIN   Past Medical History:  Diagnosis Date  . Acute on chronic systolic and diastolic heart failure, NYHA class 4 ([place]) 02/21/2017  . AICD (automatic cardioverter/defibrillator) present    [employer]  . Cancer ([place])    mulitple myeloma  . Chronic systolic CHF (congestive heart failure), NYHA class 3 ([place])   . CKD (chronic kidney disease), stage III   . Coronary artery disease   . Diabetes mellitus without complication ([place])   . Hypertension   . Myocardial infarction ([place])   . PUD (peptic ulcer disease)     Past Surgical History:  Procedure Laterality Date  . APPENDECTOMY    . BACK SURGERY    . CARDIAC DEFIBRILLATOR PLACEMENT  11/2016   [employer]  . CORONARY ANGIOPLASTY WITH STENT PLACEMENT  11/2016   total of 2 stents, 1 in January, 1 in February, both in [facility] in [place], [place]      Family History  Problem Relation Age of Onset  . Dementia Mother   . Cancer Father   . Prostate cancer Brother   . Prostate cancer Brother     Social History David Guerra reports that he has  never smoked. He has never used smokeless tobacco. David Guerra reports that he drinks alcohol.  Review of Systems CONSTITUTIONAL: No weight loss, fever, chills, weakness or fatigue.  HEENT: Eyes: No visual loss, blurred vision, double vision or yellow sclerae. No hearing loss, sneezing, congestion, runny nose or sore throat.  SKIN: No rash or itching.  CARDIOVASCULAR: per hpi.  RESPIRATORY: per hpi GASTROINTESTINAL: No anorexia, nausea, vomiting or diarrhea. No abdominal pain or blood.  GENITOURINARY: no polyuria, no dysuria NEUROLOGICAL: No headache, dizziness, syncope, paralysis, ataxia, numbness or tingling in the extremities. No change in bowel or bladder control.  MUSCULOSKELETAL: No muscle, back pain, joint pain or stiffness.  HEMATOLOGIC: No anemia, bleeding or bruising.  LYMPHATICS: No enlarged nodes. No history of splenectomy.  PSYCHIATRIC: No history of depression or anxiety.      Physical Examination Temperature 98.2 F (36.8 C), temperature source Oral. No intake or output data in the 24 hours ending 03/26/17 1906  HEENT: sclera clear, throat clear  Cardiovascular: RRR, no m/r/g,  Respiratory: mild bilateral crackles  GI: abdomen soft, NT, ND  MSK: trace bilateral edema. Left chest wall tender to palpation.   Neuro: no focal deficits  Psych: appropriate affect   Lab Results  Basic Metabolic Panel:  Recent Labs Lab 03/26/17 1735  NA 141  K 4.1  CL 111  CO2 27  GLUCOSE 80  BUN 28*  CREATININE 2.12*  CALCIUM 8.3*    Liver Function Tests: No results for input(s): AST, ALT, ALKPHOS, BILITOT, PROT, ALBUMIN in the last 168 hours.  CBC:  Recent Labs Lab 03/26/17 1735  WBC 5.2  HGB 8.3*  HCT 25.8*  MCV 84.9  PLT 153    Cardiac Enzymes: No results for input(s): CKTOTAL, CKMB, CKMBINDEX, TROPONINI in the last 168 hours.  BNP: Invalid input(s): POCBNP   ECG   Imaging 02/2017 echo Study Conclusions  - Left ventricle: Inferior wall  hypokinesis The cavity size was   mildly dilated. Wall thickness was normal. Systolic function was   moderately reduced. The estimated ejection fraction was in the   range of 35% to 40%. - Mitral valve: There was mild regurgitation. - Left atrium: The atrium was moderately dilated. - Right ventricle: The cavity size was mildly dilated. - Atrial septum: No defect or patent foramen ovale was identified. - Pulmonary arteries: PA peak pressure: 53 mm Hg (S). - Pericardium, extracardiac: A trivial pericardial effusion was   identified.  03/26/17 CXR IMPRESSION: Enlargement of cardiac silhouette with pulmonary vascular congestion.  Bronchitic changes with RIGHT basilar atelectasis and suspect small RIGHT pleural effusion.  No acute infiltrate.     Impression/Recommendations 1. Atypical chest pain - constant chest pain since 11AM (going on 8.5 hrs), worst with palpation - EKG and enzymes thus far not consistent with ACS - cycle enzymes overnight and EKG, at this time no evidence this is cardiac related  2. CAD - continue home regimen including DAPT given his prior stents in 10/2016  3. Anemia - Hgb down to 8, unclear if related to his fatigue - workup per primary team  4. Acute on chronic systolic HF - mild volume overload, will give IV lasix x 60mg  x1. Reevaluate in AM if additional diuresis needed - continue home regimen, no ACE/ARB/adlactone given his poor renal function.     Patient to be admitted to medicine service, cardiology to follow as consult Carlyle Dolly, M.D.

## 2017-03-26 NOTE — ED Triage Notes (Signed)
Pt arrived via EMS from home after experiencing chest pain following a walk. Pt states he had increasing cp with sob. Pt self administered 324 ASA prior to EMS arrival. EMS ave nitro x2 upon arrival to scene. Initial pain 7/10, reduced to 5/10 after second nitro. Pt denies n/v or recent illness. Pt has cardiac hx, including an ICD. Pt also c/o abdominal pain. Pt is alert and oriented x4.

## 2017-03-27 DIAGNOSIS — I255 Ischemic cardiomyopathy: Secondary | ICD-10-CM | POA: Diagnosis present

## 2017-03-27 DIAGNOSIS — M4604 Spinal enthesopathy, thoracic region: Secondary | ICD-10-CM | POA: Diagnosis present

## 2017-03-27 DIAGNOSIS — D696 Thrombocytopenia, unspecified: Secondary | ICD-10-CM | POA: Diagnosis present

## 2017-03-27 DIAGNOSIS — R079 Chest pain, unspecified: Secondary | ICD-10-CM | POA: Diagnosis not present

## 2017-03-27 DIAGNOSIS — N183 Chronic kidney disease, stage 3 (moderate): Secondary | ICD-10-CM

## 2017-03-27 DIAGNOSIS — E876 Hypokalemia: Secondary | ICD-10-CM | POA: Diagnosis not present

## 2017-03-27 DIAGNOSIS — I5043 Acute on chronic combined systolic (congestive) and diastolic (congestive) heart failure: Secondary | ICD-10-CM

## 2017-03-27 DIAGNOSIS — J449 Chronic obstructive pulmonary disease, unspecified: Secondary | ICD-10-CM | POA: Diagnosis present

## 2017-03-27 DIAGNOSIS — Z7984 Long term (current) use of oral hypoglycemic drugs: Secondary | ICD-10-CM | POA: Diagnosis not present

## 2017-03-27 DIAGNOSIS — Z7982 Long term (current) use of aspirin: Secondary | ICD-10-CM | POA: Diagnosis not present

## 2017-03-27 DIAGNOSIS — Z7902 Long term (current) use of antithrombotics/antiplatelets: Secondary | ICD-10-CM | POA: Diagnosis not present

## 2017-03-27 DIAGNOSIS — D631 Anemia in chronic kidney disease: Secondary | ICD-10-CM | POA: Diagnosis present

## 2017-03-27 DIAGNOSIS — D649 Anemia, unspecified: Secondary | ICD-10-CM | POA: Diagnosis not present

## 2017-03-27 DIAGNOSIS — I5023 Acute on chronic systolic (congestive) heart failure: Secondary | ICD-10-CM

## 2017-03-27 DIAGNOSIS — E1100 Type 2 diabetes mellitus with hyperosmolarity without nonketotic hyperglycemic-hyperosmolar coma (NKHHC): Secondary | ICD-10-CM

## 2017-03-27 DIAGNOSIS — I2510 Atherosclerotic heart disease of native coronary artery without angina pectoris: Secondary | ICD-10-CM | POA: Diagnosis present

## 2017-03-27 DIAGNOSIS — R0789 Other chest pain: Secondary | ICD-10-CM | POA: Diagnosis not present

## 2017-03-27 DIAGNOSIS — Z8546 Personal history of malignant neoplasm of prostate: Secondary | ICD-10-CM | POA: Diagnosis not present

## 2017-03-27 DIAGNOSIS — Z8711 Personal history of peptic ulcer disease: Secondary | ICD-10-CM | POA: Diagnosis not present

## 2017-03-27 DIAGNOSIS — Z955 Presence of coronary angioplasty implant and graft: Secondary | ICD-10-CM | POA: Diagnosis not present

## 2017-03-27 DIAGNOSIS — I10 Essential (primary) hypertension: Secondary | ICD-10-CM

## 2017-03-27 DIAGNOSIS — E785 Hyperlipidemia, unspecified: Secondary | ICD-10-CM | POA: Diagnosis present

## 2017-03-27 DIAGNOSIS — C9000 Multiple myeloma not having achieved remission: Secondary | ICD-10-CM | POA: Diagnosis present

## 2017-03-27 DIAGNOSIS — Z8674 Personal history of sudden cardiac arrest: Secondary | ICD-10-CM | POA: Diagnosis not present

## 2017-03-27 DIAGNOSIS — N179 Acute kidney failure, unspecified: Secondary | ICD-10-CM | POA: Diagnosis not present

## 2017-03-27 DIAGNOSIS — I130 Hypertensive heart and chronic kidney disease with heart failure and stage 1 through stage 4 chronic kidney disease, or unspecified chronic kidney disease: Secondary | ICD-10-CM | POA: Diagnosis present

## 2017-03-27 DIAGNOSIS — Z95810 Presence of automatic (implantable) cardiac defibrillator: Secondary | ICD-10-CM | POA: Diagnosis not present

## 2017-03-27 DIAGNOSIS — E1122 Type 2 diabetes mellitus with diabetic chronic kidney disease: Secondary | ICD-10-CM | POA: Diagnosis present

## 2017-03-27 DIAGNOSIS — I5042 Chronic combined systolic (congestive) and diastolic (congestive) heart failure: Secondary | ICD-10-CM | POA: Diagnosis not present

## 2017-03-27 DIAGNOSIS — N189 Chronic kidney disease, unspecified: Secondary | ICD-10-CM | POA: Diagnosis not present

## 2017-03-27 DIAGNOSIS — I252 Old myocardial infarction: Secondary | ICD-10-CM | POA: Diagnosis not present

## 2017-03-27 LAB — TROPONIN I
TROPONIN I: 0.03 ng/mL — AB (ref <0.03)
Troponin I: 0.03 ng/mL (ref <0.03)
Troponin I: <0.03 ng/mL (ref <0.03)

## 2017-03-27 LAB — CBC
HEMATOCRIT: 25.9 % — AB (ref 39.0–52.0)
Hemoglobin: 8.5 g/dL — ABNORMAL LOW (ref 13.0–17.0)
MCH: 27.7 pg (ref 26.0–34.0)
MCHC: 32.8 g/dL (ref 30.0–36.0)
MCV: 84.4 fL (ref 78.0–100.0)
Platelets: 134 10*3/uL — ABNORMAL LOW (ref 150–400)
RBC: 3.07 MIL/uL — ABNORMAL LOW (ref 4.22–5.81)
RDW: 18.3 % — AB (ref 11.5–15.5)
WBC: 4.9 10*3/uL (ref 4.0–10.5)

## 2017-03-27 LAB — BASIC METABOLIC PANEL
Anion gap: 8 (ref 5–15)
BUN: 22 mg/dL — AB (ref 6–20)
CHLORIDE: 107 mmol/L (ref 101–111)
CO2: 27 mmol/L (ref 22–32)
CREATININE: 1.7 mg/dL — AB (ref 0.61–1.24)
Calcium: 8.2 mg/dL — ABNORMAL LOW (ref 8.9–10.3)
GFR calc Af Amer: 41 mL/min — ABNORMAL LOW (ref >60)
GFR calc non Af Amer: 36 mL/min — ABNORMAL LOW (ref >60)
GLUCOSE: 87 mg/dL (ref 65–99)
POTASSIUM: 3.4 mmol/L — AB (ref 3.5–5.1)
Sodium: 142 mmol/L (ref 135–145)

## 2017-03-27 LAB — GLUCOSE, CAPILLARY
GLUCOSE-CAPILLARY: 78 mg/dL (ref 65–99)
GLUCOSE-CAPILLARY: 152 mg/dL — AB (ref 65–99)
Glucose-Capillary: 80 mg/dL (ref 65–99)

## 2017-03-27 MED ORDER — INSULIN ASPART 100 UNIT/ML ~~LOC~~ SOLN
0.0000 [IU] | Freq: Three times a day (TID) | SUBCUTANEOUS | Status: DC
  Administered 2017-03-29: 1 [IU] via SUBCUTANEOUS
  Administered 2017-03-30: 2 [IU] via SUBCUTANEOUS

## 2017-03-27 NOTE — Care Management Obs Status (Signed)
[place] NOTIFICATION   Patient Details  Name: Oswaldo Cueto MRN: [id number] Date of Birth: 22-Sep-1933   Medicare Observation Status Notification Given:  Yes    Ninfa Meeker, RN 03/27/2017, 1:57 PM

## 2017-03-27 NOTE — Progress Notes (Signed)
PROGRESS NOTE   David Guerra  VZC:[id number]    DOB: 1932/12/06    DOA: 03/26/2017  PCP: Pcp Not In System   I have briefly reviewed patients previous medical records in [facility].  Brief Narrative:  83 year old male, [name], visiting his daughter in [name] since March 2018 and recuperating from recent hospitalization in [place], [place] of [place], status post 3 hospitalizations between 10/2016-12/2016 when he underwent 2 coronary stents and AICD placement (per his report), CAD, ICM, chronic combined systolic and diastolic CHF, COPD, stage III CKD, DM 2, HTN, PUD, multiple myeloma admitted on 03/26/17 with new onset chest pain, fatigue and dyspnea. This is his third admission in the last 6 months. Admitted for chest pain evaluation. Cardiology consulting and awaiting records from outside hospital prior to planning further hoarse of treatment.   Assessment & Plan:   Principal Problem:   Chest pain Active Problems:   HTN (hypertension)   AICD (automatic cardioverter/defibrillator) present   Diabetes ([place])   CKD (chronic kidney disease), stage III   Chronic combined systolic and diastolic CHF (congestive heart failure) (HCC)   Anemia   1. Chest pain, atypical: EKG without acute changes. Troponin 4: Negative despite presenting with several hours of chest pain. States the chest pain had resolved until briefly this morning when happened again when he was trying to move in bed.? Musculoskeletal. Cardiology input appreciated. Discussed with Dr. Radford Pax, waiting medical records from OSH to determine next course of action. 2. MI history/CAD status post stenting 2: Continue aspirin, Plavix, statins and beta blockers. Management as above. 3. Type II DM with renal complications: CBGs to tightly controlled. Change SSI to sensitive without bedtime scale. Monitor closely. 4. Essential hypertension: Fluctuating and mildly uncontrolled at times. Continue carvedilol, hydralazine, Imdur. 5. Acute on chronic  combined CHF: LVEF 35-40 percent by echo 02/2017. Status post AICD in the last 6 months in [place]. Status post Lasix 60 mg IV 1 on admission and -1.9 L. Continue home dose of Lasix 60 mg twice a day, continue hydralazine and Imdur. No ACEI/ARB or Aldactone due to renal insufficiency. Follow BMP in a.m. As discussed with cardiology, unclear as to why he is on amiodarone. 6. Stage III chronic kidney disease: Baseline creatinine 1.8-1.9. Presented with creatinine of 2.12. Follow BMP this morning and daily. 7. Normocytic anemia, chronic: Baseline creatinine in the 8-9 range. Stable. Possibly related to chronic kidney disease. Outpatient evaluation as deemed necessary. 8. Thrombocytopenia, chronic: Stable.   DVT prophylaxis: Lovenox Code Status: Full Family Communication: None at bedside Disposition: DC home when medically stable   Consultants:  Cardiology   Procedures:  None  Antimicrobials:  None    Subjective: Feels much better. Dyspnea improved but breathing not yet at baseline. Chest pain had resolved since last night up to this morning when complained of brief left-sided chest pain when he turned in bed. Denies lifting or moving heavy objects.   ROS: No dizziness or lightheadedness reported. States that he lives with his daughter currently and ambulates with the help of a cane. Does not use home oxygen.  Objective:  Vitals:   03/27/17 0047 03/27/17 0513 03/27/17 0653 03/27/17 0806  BP: (!) 157/81 (!) 127/92 (!) 167/79 134/73  Pulse:  60  60  Resp:  17  20  Temp:  97.8 F (36.6 C)  98.6 F (37 C)  TempSrc:  Oral  Oral  SpO2:  100%  100%  Weight:  84.2 kg (185 lb 11.2 oz)  Height:        Examination:  General exam: Pleasant elderly male lying comfortably supine in bed. Respiratory system: Occasional basal crackles, rest clear to auscultation. Respiratory effort normal. Cardiovascular system: S1 & S2 heard, RRR. No JVD, murmurs, rubs, gallops or clicks. 1+ pitting bilateral  ankle edema. Gastrointestinal system: Abdomen is nondistended, soft and nontender. No organomegaly or masses felt. Normal bowel sounds heard. Central nervous system: Alert and oriented. No focal neurological deficits. Extremities: Symmetric 5 x 5 power. Skin: No rashes, lesions or ulcers Psychiatry: Judgement and insight appear normal. Mood & affect appropriate.     Data Reviewed: I have personally reviewed following labs and imaging studies  CBC:  Recent Labs Lab 03/26/17 1735 03/27/17 1026  WBC 5.2 4.9  HGB 8.3* 8.5*  HCT 25.8* 25.9*  MCV 84.9 84.4  PLT 153 952*   Basic Metabolic Panel:  Recent Labs Lab 03/26/17 1735  NA 141  K 4.1  CL 111  CO2 27  GLUCOSE 80  BUN 28*  CREATININE 2.12*  CALCIUM 8.3*   Cardiac Enzymes:  Recent Labs Lab 03/26/17 2216 03/27/17 0327  TROPONINI 0.03* 0.03*   CBG:  Recent Labs Lab 03/26/17 1820 03/26/17 2145 03/26/17 2205  GLUCAP 68 107* 95    No results found for this or any previous visit (from the past 240 hour(s)).       Radiology Studies: Dg Chest 2 View  Result Date: 03/26/2017 CLINICAL DATA:  LEFT chest pain and shortness of breath, history of coronary artery disease post MI and coronary stenting, hypertension, diabetes mellitus, multiple myeloma EXAM: CHEST  2 VIEW COMPARISON:  03/05/2017 FINDINGS: LEFT subclavian transvenous pacemaker/AICD leads project at RIGHT atrium and RIGHT ventricle unchanged since previous exam. Enlargement of cardiac silhouette with pulmonary vascular congestion. Stable mediastinal contours. Chronic bronchitic changes with RIGHT basilar atelectasis. Question small RIGHT pleural effusion No acute infiltrate, LEFT pleural effusion or pneumothorax. Bones demineralized with endplate spur formation thoracic spine. IMPRESSION: Enlargement of cardiac silhouette with pulmonary vascular congestion. Bronchitic changes with RIGHT basilar atelectasis and suspect small RIGHT pleural effusion. No acute  infiltrate. Electronically Signed   By: Lavonia Dana M.D.   On: 03/26/2017 17:48        Scheduled Meds: . amiodarone  200 mg Oral Daily  . aspirin  324 mg Oral Once  . aspirin EC  81 mg Oral Daily  . atorvastatin  40 mg Oral QHS  . carvedilol  12.5 mg Oral BID AC  . clopidogrel  75 mg Oral Daily  . enoxaparin (LOVENOX) injection  30 mg Subcutaneous QHS  . fluticasone  1 spray Each Nare QHS  . furosemide  60 mg Oral BID  . hydrALAZINE  50 mg Oral Q6H  . insulin aspart  0-15 Units Subcutaneous TID WC  . isosorbide dinitrate  30 mg Oral TID  . latanoprost  1 drop Both Eyes QHS  . potassium chloride SA  20 mEq Oral BID  . tamsulosin  0.4 mg Oral Daily  . vitamin B-12  1,000 mcg Oral Daily   Continuous Infusions:   LOS: 0 days     Gen Clagg, MD, FACP, FHM. [facility] Pager [phone]  If 7PM-7AM, please contact night-coverage [email or web address] Password [email or web address] 03/27/2017, 11:27 AM

## 2017-03-27 NOTE — Progress Notes (Signed)
Progress Note  Patient Name: David Guerra Date of Encounter: 03/27/2017  Primary Cardiologist: Dr. Johnsie Cancel  Subjective   No complaints this am  Inpatient Medications    Scheduled Meds: . amiodarone  200 mg Oral Daily  . aspirin  324 mg Oral Once  . aspirin EC  81 mg Oral Daily  . atorvastatin  40 mg Oral QHS  . carvedilol  12.5 mg Oral BID AC  . clopidogrel  75 mg Oral Daily  . enoxaparin (LOVENOX) injection  30 mg Subcutaneous QHS  . fluticasone  1 spray Each Nare QHS  . furosemide  60 mg Oral BID  . hydrALAZINE  50 mg Oral Q6H  . insulin aspart  0-15 Units Subcutaneous TID WC  . isosorbide dinitrate  30 mg Oral TID  . latanoprost  1 drop Both Eyes QHS  . potassium chloride SA  20 mEq Oral BID  . tamsulosin  0.4 mg Oral Daily  . vitamin B-12  1,000 mcg Oral Daily   Continuous Infusions:  PRN Meds: acetaminophen, albuterol, gi cocktail, morphine injection, nitroGLYCERIN, ondansetron (ZOFRAN) IV   Vital Signs    Vitals:   03/26/17 2322 03/27/17 0047 03/27/17 0513 03/27/17 0653  BP: (!) 172/86 (!) 157/81 (!) 127/92 (!) 167/79  Pulse:   60   Resp:   17   Temp:   97.8 F (36.6 C)   TempSrc:   Oral   SpO2:   100%   Weight:   185 lb 11.2 oz (84.2 kg)   Height:        Intake/Output Summary (Last 24 hours) at 03/27/17 0717 Last data filed at 03/27/17 0650  Gross per 24 hour  Intake              220 ml  Output             2020 ml  Net            -1800 ml   Filed Weights   03/26/17 2208 03/27/17 0513  Weight: 174 lb 3.2 oz (79 kg) 185 lb 11.2 oz (84.2 kg)    Telemetry    NSR - Personally Reviewed  ECG    Atrial paced with incomplete LBBB and prolonged QTc - Personally Reviewed  Physical Exam   GEN: No acute distress.   Neck: No JVD Cardiac: RRR, no murmurs, rubs, or gallops.  Respiratory: Clear to auscultation bilaterally. GI: Soft, nontender, non-distended  MS: No edema; No deformity. Neuro:  Nonfocal  Psych: Normal affect   Labs      Chemistry Recent Labs Lab 03/26/17 1735  NA 141  K 4.1  CL 111  CO2 27  GLUCOSE 80  BUN 28*  CREATININE 2.12*  CALCIUM 8.3*  GFRNONAA 27*  GFRAA 32*  ANIONGAP 3*     Hematology Recent Labs Lab 03/26/17 1735  WBC 5.2  RBC 3.04*  HGB 8.3*  HCT 25.8*  MCV 84.9  MCH 27.3  MCHC 32.2  RDW 17.8*  PLT 153    Cardiac Enzymes Recent Labs Lab 03/26/17 2216 03/27/17 0327  TROPONINI 0.03* 0.03*    Recent Labs Lab 03/26/17 1745 03/26/17 2128  TROPIPOC 0.03 0.03     BNP Recent Labs Lab 03/26/17 2216  BNP 1,148.0*     DDimer No results for input(s): DDIMER in the last 168 hours.   Radiology    Dg Chest 2 View  Result Date: 03/26/2017 CLINICAL DATA:  LEFT chest pain and shortness of breath, history of  coronary artery disease post MI and coronary stenting, hypertension, diabetes mellitus, multiple myeloma EXAM: CHEST  2 VIEW COMPARISON:  03/05/2017 FINDINGS: LEFT subclavian transvenous pacemaker/AICD leads project at RIGHT atrium and RIGHT ventricle unchanged since previous exam. Enlargement of cardiac silhouette with pulmonary vascular congestion. Stable mediastinal contours. Chronic bronchitic changes with RIGHT basilar atelectasis. Question small RIGHT pleural effusion No acute infiltrate, LEFT pleural effusion or pneumothorax. Bones demineralized with endplate spur formation thoracic spine. IMPRESSION: Enlargement of cardiac silhouette with pulmonary vascular congestion. Bronchitic changes with RIGHT basilar atelectasis and suspect small RIGHT pleural effusion. No acute infiltrate. Electronically Signed   By: Lavonia Dana M.D.   On: 03/26/2017 17:48    Cardiac Studies  2D echo 02/2017 Study Conclusions  - Left ventricle: Inferior wall hypokinesis The cavity size was   mildly dilated. Wall thickness was normal. Systolic function was   moderately reduced. The estimated ejection fraction was in the   range of 35% to 40%. - Mitral valve: There was mild  regurgitation. - Left atrium: The atrium was moderately dilated. - Right ventricle: The cavity size was mildly dilated. - Atrial septum: No defect or patent foramen ovale was identified. - Pulmonary arteries: PA peak pressure: 53 mm Hg (S). - Pericardium, extracardiac: A trivial pericardial effusion was   identified.   Patient Profile     83 y.o. male  With a history of prior MI with stents x2 in 10/2016 in [place], chronic systolic HF LVEF 16-55% with AICD, DM2, HTN, HL, COPD, CKD III admitted with chest pain, fatigue, and sob. Multiple admissions over the last 2 months with SOB, volume overload, chest pain. Reports woke up  "feeling lousy". Had fatigue and weakness. Later in the morning developed chest pain starting around 11 AM. 8/10 dull pain left sided, +SOB, +palpitations. Of note he has had several admissions over the past 2 months for SOB, volume overload and atypical CP.  Assessment & Plan    1. Atypical chest pain - constant chest pain since 11AM yesterday(going on 8.5 hrs), worse with palpation - EKG and enzymes thus far not consistent with ACS - Trop negative x 4 at 0.03  2. CAD s/p prior stenting x 2 in [place] - continue home regimen including DAPT given his prior stents in 10/2016 - continue ASA/Plavix/statin/BB  3. Anemia - Hgb down to 8.3, unclear if related to his fatigue - workup per primary team  4. Acute on chronic systolic HF (EF 37-48% by echo 02/2017) s/p AICD remotely - mild volume overload, BNP elevated at 1148 - given IV lasix x 83m x1 last night and put out 2L.  He is net neg 1.8L. - weight increased this am so likely inaccurate.  He feels better with no SOB. - continue home regimen of hydralazine/Imdur  - no ACE/ARB/adlactone given his poor renal function.  - BMET pending this am - restart home dose of Lasix 670mBID - He is on amio for unclear reasons as he does not have a history of afib listed in PMChetek5.  HTN - BP controlled on home meds  including hydralazine and BB.  6,.  CKD stage 3 - follow BMET while diuresing   Signed, TrFransico HimMD  03/27/2017, 7:17 AM

## 2017-03-28 DIAGNOSIS — E876 Hypokalemia: Secondary | ICD-10-CM

## 2017-03-28 LAB — CBC
HCT: 27.5 % — ABNORMAL LOW (ref 39.0–52.0)
Hemoglobin: 8.8 g/dL — ABNORMAL LOW (ref 13.0–17.0)
MCH: 27 pg (ref 26.0–34.0)
MCHC: 32 g/dL (ref 30.0–36.0)
MCV: 84.4 fL (ref 78.0–100.0)
PLATELETS: 147 10*3/uL — AB (ref 150–400)
RBC: 3.26 MIL/uL — ABNORMAL LOW (ref 4.22–5.81)
RDW: 17.9 % — AB (ref 11.5–15.5)
WBC: 5.4 10*3/uL (ref 4.0–10.5)

## 2017-03-28 LAB — BASIC METABOLIC PANEL
ANION GAP: 9 (ref 5–15)
BUN: 21 mg/dL — ABNORMAL HIGH (ref 6–20)
CO2: 26 mmol/L (ref 22–32)
Calcium: 8.2 mg/dL — ABNORMAL LOW (ref 8.9–10.3)
Chloride: 106 mmol/L (ref 101–111)
Creatinine, Ser: 1.89 mg/dL — ABNORMAL HIGH (ref 0.61–1.24)
GFR calc Af Amer: 36 mL/min — ABNORMAL LOW (ref >60)
GFR, EST NON AFRICAN AMERICAN: 31 mL/min — AB (ref >60)
GLUCOSE: 87 mg/dL (ref 65–99)
POTASSIUM: 3.7 mmol/L (ref 3.5–5.1)
SODIUM: 141 mmol/L (ref 135–145)

## 2017-03-28 LAB — GLUCOSE, CAPILLARY
GLUCOSE-CAPILLARY: 85 mg/dL (ref 65–99)
GLUCOSE-CAPILLARY: 112 mg/dL — AB (ref 65–99)
Glucose-Capillary: 115 mg/dL — ABNORMAL HIGH (ref 65–99)
Glucose-Capillary: 125 mg/dL — ABNORMAL HIGH (ref 65–99)

## 2017-03-28 LAB — HEMOGLOBIN A1C
Hgb A1c MFr Bld: 5.7 % — ABNORMAL HIGH (ref 4.8–5.6)
Mean Plasma Glucose: 117 mg/dL

## 2017-03-28 NOTE — Progress Notes (Signed)
PROGRESS NOTE   David Guerra  [id number]:[id number]    DOB: [birth year]    DOA: 03/26/2017  PCP: System, Pcp Not In   I have briefly reviewed patients previous medical records in [facility].  Brief Narrative:  83 year old male, [place] resident, visiting his daughter in [name] since March 2018 and recuperating from recent hospitalization in [place], [place] of [place], status post 3 hospitalizations between 10/2016-12/2016 when he underwent 2 coronary stents and AICD placement (per his report), CAD, ICM, chronic combined systolic and diastolic CHF, COPD, stage III CKD, DM 2, HTN, PUD, multiple myeloma admitted on 03/26/17 with new onset chest pain, fatigue and dyspnea. This is his third admission in the last 6 months. Admitted for chest pain evaluation. Cardiology consulting and unable to obtain records from OSH. Plan for [facility] 03/29/17.   Assessment & Plan:   Principal Problem:   Chest pain Active Problems:   HTN (hypertension)   AICD (automatic cardioverter/defibrillator) present   Diabetes ([place])   CKD (chronic kidney disease), stage III   Chronic combined systolic and diastolic CHF (congestive heart failure) (HCC)   Anemia   1. Chest pain, atypical: EKG without acute changes. Troponin 4: Negative despite presenting with several hours of chest pain. ? Musculoskeletal versus ischemic. Cardiology follow-up appreciated. Discussed with Dr. Radford Pax. Unable to obtain medical records from OSH yesterday. She recommends Lexiscan Myoview 03/29/17 (already ate breakfast this morning). No chest pain since yesterday morning. 2. MI history/CAD status post stenting 2: Continue aspirin, Plavix, statins and beta blockers. Management as above. 3. Type II DM with renal complications: CBGs to tightly controlled. Changed SSI to sensitive without bedtime scale. Monitor closely. Better. 4. Essential hypertension: Fluctuating and mildly uncontrolled at times. Continue carvedilol, hydralazine, Imdur. 5. Acute on chronic  combined CHF: LVEF 35-40 percent by echo 02/2017. Status post AICD in the last 6 months in [place]. Status post Lasix 60 mg IV 1 on admission and -2.9 L. Continue home dose of Lasix 60 mg twice a day, continue hydralazine and Imdur. No ACEI/ARB or Aldactone due to renal insufficiency. As discussed with cardiology, unclear as to why he is on amiodarone. Follow BMP in a.m. 6. Stage III chronic kidney disease: Baseline creatinine 1.8-1.9. Presented with creatinine of 2.12. Creatinine still remains at baseline. 7. Normocytic anemia, chronic: Baseline creatinine in the 8-9 range. Stable. Possibly related to chronic kidney disease. Outpatient evaluation as deemed necessary. 8. Thrombocytopenia, chronic: Stable. 9. Hypokalemia: Replaced   DVT prophylaxis: Lovenox Code Status: Full Family Communication: None at bedside Disposition: DC home when medically stable   Consultants:  Cardiology   Procedures:  None  Antimicrobials:  None    Subjective: Yesterday morning's chest pain lasted an hour and then resolved. He has not had chest pain since. Denies dyspnea.  ROS: No dizziness or lightheadedness reported. States that he lives with his daughter currently and ambulates with the help of a cane. Does not use home oxygen.  Objective:  Vitals:   03/28/17 0037 03/28/17 0347 03/28/17 0740 03/28/17 0936  BP: 122/62 110/61 (!) 153/81 (!) 151/68  Pulse: 60 60 (!) 59 61  Resp: 15 18 18    Temp: 98.3 F (36.8 C) 98.2 F (36.8 C) 98.6 F (37 C)   TempSrc: Oral Oral Oral   SpO2: 97% 98% 100%   Weight:  83 kg (182 lb 14.4 oz)    Height:        Examination:  General exam: Pleasant elderly male sitting up comfortably in chair eating breakfast  this morning. Respiratory system: Occasional basal crackles, rest clear to auscultation. Respiratory effort normal. Cardiovascular system: S1 & S2 heard, RRR. No JVD, murmurs, rubs, gallops or clicks. Trace bilateral ankle edema. Telemetry: AV paced  rhythm. Gastrointestinal system: Abdomen is nondistended, soft and nontender. No organomegaly or masses felt. Normal bowel sounds heard. Central nervous system: Alert and oriented. No focal neurological deficits. Extremities: Symmetric 5 x 5 power. Skin: No rashes, lesions or ulcers Psychiatry: Judgement and insight appear normal. Mood & affect appropriate.     Data Reviewed: I have personally reviewed following labs and imaging studies  CBC:  Recent Labs Lab 03/26/17 1735 03/27/17 1026 03/28/17 0448  WBC 5.2 4.9 5.4  HGB 8.3* 8.5* 8.8*  HCT 25.8* 25.9* 27.5*  MCV 84.9 84.4 84.4  PLT 153 134* 299*   Basic Metabolic Panel:  Recent Labs Lab 03/26/17 1735 03/27/17 1026 03/28/17 0448  NA 141 142 141  K 4.1 3.4* 3.7  CL 111 107 106  CO2 27 27 26   GLUCOSE 80 87 87  BUN 28* 22* 21*  CREATININE 2.12* 1.70* 1.89*  CALCIUM 8.3* 8.2* 8.2*   Cardiac Enzymes:  Recent Labs Lab 03/26/17 2216 03/27/17 0327 03/27/17 1026  TROPONINI 0.03* 0.03* <0.03   CBG:  Recent Labs Lab 03/27/17 1200 03/27/17 1616 03/27/17 1954 03/28/17 0742 03/28/17 1113  GLUCAP 80 78 152* 85 112*    No results found for this or any previous visit (from the past 240 hour(s)).       Radiology Studies: Dg Chest 2 View  Result Date: 03/26/2017 CLINICAL DATA:  LEFT chest pain and shortness of breath, history of coronary artery disease post MI and coronary stenting, hypertension, diabetes mellitus, multiple myeloma EXAM: CHEST  2 VIEW COMPARISON:  03/05/2017 FINDINGS: LEFT subclavian transvenous pacemaker/AICD leads project at RIGHT atrium and RIGHT ventricle unchanged since previous exam. Enlargement of cardiac silhouette with pulmonary vascular congestion. Stable mediastinal contours. Chronic bronchitic changes with RIGHT basilar atelectasis. Question small RIGHT pleural effusion No acute infiltrate, LEFT pleural effusion or pneumothorax. Bones demineralized with endplate spur formation thoracic  spine. IMPRESSION: Enlargement of cardiac silhouette with pulmonary vascular congestion. Bronchitic changes with RIGHT basilar atelectasis and suspect small RIGHT pleural effusion. No acute infiltrate. Electronically Signed   By: Lavonia Dana M.D.   On: 03/26/2017 17:48        Scheduled Meds: . amiodarone  200 mg Oral Daily  . aspirin  324 mg Oral Once  . aspirin EC  81 mg Oral Daily  . atorvastatin  40 mg Oral QHS  . carvedilol  12.5 mg Oral BID AC  . clopidogrel  75 mg Oral Daily  . enoxaparin (LOVENOX) injection  30 mg Subcutaneous QHS  . fluticasone  1 spray Each Nare QHS  . furosemide  60 mg Oral BID  . hydrALAZINE  50 mg Oral Q6H  . insulin aspart  0-9 Units Subcutaneous TID WC  . isosorbide dinitrate  30 mg Oral TID  . latanoprost  1 drop Both Eyes QHS  . potassium chloride SA  20 mEq Oral BID  . tamsulosin  0.4 mg Oral Daily  . vitamin B-12  1,000 mcg Oral Daily   Continuous Infusions:   LOS: 1 day     Sorrel Cassetta, MD, FACP, FHM. [facility] Pager [phone]  If 7PM-7AM, please contact night-coverage [email or web address] Password [email or web address] 03/28/2017, 11:58 AM

## 2017-03-28 NOTE — Progress Notes (Addendum)
Progress Note  Patient Name: David Guerra Date of Encounter: 03/28/2017  Primary Cardiologist:   Subjective  No complaints  Inpatient Medications    Scheduled Meds: . amiodarone  200 mg Oral Daily  . aspirin  324 mg Oral Once  . aspirin EC  81 mg Oral Daily  . atorvastatin  40 mg Oral QHS  . carvedilol  12.5 mg Oral BID AC  . clopidogrel  75 mg Oral Daily  . enoxaparin (LOVENOX) injection  30 mg Subcutaneous QHS  . fluticasone  1 spray Each Nare QHS  . furosemide  60 mg Oral BID  . hydrALAZINE  50 mg Oral Q6H  . insulin aspart  0-9 Units Subcutaneous TID WC  . isosorbide dinitrate  30 mg Oral TID  . latanoprost  1 drop Both Eyes QHS  . potassium chloride SA  20 mEq Oral BID  . tamsulosin  0.4 mg Oral Daily  . vitamin B-12  1,000 mcg Oral Daily   Continuous Infusions:  PRN Meds: acetaminophen, albuterol, gi cocktail, morphine injection, nitroGLYCERIN, ondansetron (ZOFRAN) IV   Vital Signs    Vitals:   03/28/17 0037 03/28/17 0347 03/28/17 0740 03/28/17 0936  BP: 122/62 110/61 (!) 153/81 (!) 151/68  Pulse: 60 60 (!) 59 61  Resp: _0 Temp: 98.3 F (36.8 C) 98.2 F (36.8 C) 98.6 F (37 C)   TempSrc: Oral Oral Oral   SpO2: 97% 98% 100%   Weight:  182 lb 14.4 oz (83 kg)    Height:        Intake/Output Summary (Last 24 hours) at 03/28/17 0939 Last data filed at 03/28/17 0600  Gross per 24 hour  Intake              240 ml  Output             1300 ml  Net            -1060 ml   Filed Weights   03/26/17 2208 03/27/17 0513 03/28/17 0347  Weight: 174 lb 3.2 oz (79 kg) 185 lb 11.2 oz (84.2 kg) 182 lb 14.4 oz (83 kg)    Telemetry     - Personally Reviewed  ECG     - Personally Reviewed  Physical Exam   GEN: No acute distress.   Neck: No JVD Cardiac: RRR, no murmurs, rubs, or gallops.  Respiratory: Clear to auscultation bilaterally. GI: Soft, nontender, non-distended  MS: No edema; No deformity. Neuro:  Nonfocal  Psych: Normal affect   Labs      Chemistry Recent Labs Lab 03/26/17 1735 03/27/17 1026 03/28/17 0448  NA 141 142 141  K 4.1 3.4* 3.7  CL 111 107 106  CO2 _1 GLUCOSE 80 87 87  BUN 28* 22* 21*  CREATININE 2.12* 1.70* 1.89*  CALCIUM 8.3* 8.2* 8.2*  GFRNONAA 27* 36* 31*  GFRAA 32* 41* 36*  ANIONGAP 3* 8 9     Hematology Recent Labs Lab 03/26/17 1735 03/27/17 1026 03/28/17 0448  WBC 5.2 4.9 5.4  RBC 3.04* 3.07* 3.26*  HGB 8.3* 8.5* 8.8*  HCT 25.8* 25.9* 27.5*  MCV 84.9 84.4 84.4  MCH 27.3 27.7 27.0  MCHC 32.2 32.8 32.0  RDW 17.8* 18.3* 17.9*  PLT 153 134* 147*    Cardiac Enzymes Recent Labs Lab 03/26/17 2216 03/27/17 0327 03/27/17 1026  TROPONINI 0.03* 0.03* <0.03    Recent Labs Lab 03/26/17 1745 03/26/17 2128  TROPIPOC 0.03 0.03  BNP Recent Labs Lab 03/26/17 2216  BNP 1,148.0*     DDimer No results for input(s): DDIMER in the last 168 hours.   Radiology    Dg Chest 2 View  Result Date: 03/26/2017 CLINICAL DATA:  LEFT chest pain and shortness of breath, history of coronary artery disease post MI and coronary stenting, hypertension, diabetes mellitus, multiple myeloma EXAM: CHEST  2 VIEW COMPARISON:  03/05/2017 FINDINGS: LEFT subclavian transvenous pacemaker/AICD leads project at RIGHT atrium and RIGHT ventricle unchanged since previous exam. Enlargement of cardiac silhouette with pulmonary vascular congestion. Stable mediastinal contours. Chronic bronchitic changes with RIGHT basilar atelectasis. Question small RIGHT pleural effusion No acute infiltrate, LEFT pleural effusion or pneumothorax. Bones demineralized with endplate spur formation thoracic spine. IMPRESSION: Enlargement of cardiac silhouette with pulmonary vascular congestion. Bronchitic changes with RIGHT basilar atelectasis and suspect small RIGHT pleural effusion. No acute infiltrate. Electronically Signed   By: Lavonia Dana M.D.   On: 03/26/2017 17:48    Cardiac Studies    Patient Profile         83 y.o.  male  With a history of prior MI with stents x2 in 10/2016 in [place], chronic systolic HF LVEF 53-66% with AICD, DM2, HTN, HL, COPD, CKD III admitted with chest pain, fatigue, and sob. Multiple admissions over the last 2 months with SOB, volume overload, chest pain. Reports woke up  "feeling lousy". Had fatigue and weakness. Later in the morning developed chest pain starting around 11 AM. 8/10 dull pain left sided, +SOB, +palpitations. Of note he has had several admissions over the past 2 months for SOB, volume overload and atypical CP. Assessment & Plan    1. Chest pain - atypical symptoms, on admission had been ongoing constant for 8.5 hours. - no evidence of ACS by enzymes or EKG - repeat episode of chest pain yesterday, lasted about 1 hour - intermittent chest pain symptoms over the last month. Though they are atypical, given his history still have some concern about possible CAD - pt had breakfast this AM, will make npo tonight, plan for lexiscan MPI tomorrow.   2. Acute on chronic systolic HF - negative 3 liters this admission, he is back on his oral lasix regimen. Renal function stable - - continue home regimen, no ACE/ARB/adlactone given his poor renal function.   3. CAD - continue home regimen including DAPT given his prior stents in 10/2016  Merrily Pew, MD  03/28/2017, 9:39 AM

## 2017-03-29 ENCOUNTER — Inpatient Hospital Stay (HOSPITAL_COMMUNITY): Payer: Medicare Other

## 2017-03-29 DIAGNOSIS — N179 Acute kidney failure, unspecified: Secondary | ICD-10-CM

## 2017-03-29 DIAGNOSIS — R079 Chest pain, unspecified: Secondary | ICD-10-CM

## 2017-03-29 DIAGNOSIS — N189 Chronic kidney disease, unspecified: Secondary | ICD-10-CM

## 2017-03-29 LAB — GLUCOSE, CAPILLARY
GLUCOSE-CAPILLARY: 140 mg/dL — AB (ref 65–99)
Glucose-Capillary: 85 mg/dL (ref 65–99)
Glucose-Capillary: 116 mg/dL — ABNORMAL HIGH (ref 65–99)
Glucose-Capillary: 103 mg/dL — ABNORMAL HIGH (ref 65–99)

## 2017-03-29 LAB — NM MYOCAR MULTI W/SPECT W/WALL MOTION / EF
CHL CUP MPHR: 138 {beats}/min
CSEPHR: 57 %
CSEPPHR: 80 {beats}/min
Estimated workload: 1 METS
Exercise duration (min): 5 min
Rest HR: 63 {beats}/min

## 2017-03-29 LAB — BASIC METABOLIC PANEL
Anion gap: 8 (ref 5–15)
BUN: 24 mg/dL — ABNORMAL HIGH (ref 6–20)
CALCIUM: 8.4 mg/dL — AB (ref 8.9–10.3)
CO2: 27 mmol/L (ref 22–32)
CREATININE: 2.16 mg/dL — AB (ref 0.61–1.24)
Chloride: 106 mmol/L (ref 101–111)
GFR calc non Af Amer: 27 mL/min — ABNORMAL LOW (ref >60)
GFR, EST AFRICAN AMERICAN: 31 mL/min — AB (ref >60)
Glucose, Bld: 92 mg/dL (ref 65–99)
Potassium: 3.5 mmol/L (ref 3.5–5.1)
Sodium: 141 mmol/L (ref 135–145)

## 2017-03-29 MED ORDER — TECHNETIUM TC 99M TETROFOSMIN IV KIT
10.0000 | PACK | Freq: Once | INTRAVENOUS | Status: AC | PRN
  Administered 2017-03-29: 10 via INTRAVENOUS

## 2017-03-29 MED ORDER — REGADENOSON 0.4 MG/5ML IV SOLN
0.4000 mg | Freq: Once | INTRAVENOUS | Status: AC
  Administered 2017-03-29: 0.4 mg via INTRAVENOUS
  Filled 2017-03-29: qty 5

## 2017-03-29 MED ORDER — TECHNETIUM TC 99M TETROFOSMIN IV KIT
30.0000 | PACK | Freq: Once | INTRAVENOUS | Status: AC | PRN
  Administered 2017-03-29: 30 via INTRAVENOUS

## 2017-03-29 MED ORDER — REGADENOSON 0.4 MG/5ML IV SOLN
INTRAVENOUS | Status: AC
  Filled 2017-03-29: qty 5

## 2017-03-29 NOTE — Progress Notes (Signed)
Progress Note  Patient Name: David Guerra Date of Encounter: 03/29/2017  Primary Cardiologist:   Subjective  No chest pain onernight  Inpatient Medications    Scheduled Meds: . amiodarone  200 mg Oral Daily  . aspirin EC  81 mg Oral Daily  . atorvastatin  40 mg Oral QHS  . carvedilol  12.5 mg Oral BID AC  . clopidogrel  75 mg Oral Daily  . enoxaparin (LOVENOX) injection  30 mg Subcutaneous QHS  . fluticasone  1 spray Each Nare QHS  . furosemide  60 mg Oral BID  . hydrALAZINE  50 mg Oral Q6H  . insulin aspart  0-9 Units Subcutaneous TID WC  . isosorbide dinitrate  30 mg Oral TID  . latanoprost  1 drop Both Eyes QHS  . potassium chloride SA  20 mEq Oral BID  . tamsulosin  0.4 mg Oral Daily  . vitamin B-12  1,000 mcg Oral Daily   Continuous Infusions:  PRN Meds: acetaminophen, albuterol, gi cocktail, morphine injection, nitroGLYCERIN, ondansetron (ZOFRAN) IV   Vital Signs    Vitals:   03/28/17 2002 03/29/17 0005 03/29/17 0500 03/29/17 0612  BP: 135/68 (!) 121/59 (!) 130/49 (!) 128/52  Pulse: 60  (!) 58   Resp: 18  18   Temp: 98.6 F (37 C)  98.7 F (37.1 C)   TempSrc: Oral  Oral   SpO2: 98%  98%   Weight:   185 lb 12.8 oz (84.3 kg)   Height:        Intake/Output Summary (Last 24 hours) at 03/29/17 0819 Last data filed at 03/29/17 0500  Gross per 24 hour  Intake              840 ml  Output              625 ml  Net              215 ml   Filed Weights   03/27/17 0513 03/28/17 0347 03/29/17 0500  Weight: 185 lb 11.2 oz (84.2 kg) 182 lb 14.4 oz (83 kg) 185 lb 12.8 oz (84.3 kg)    Telemetry     - Personally Reviewed  ECG     - Personally Reviewed  Physical Exam   GEN: No acute distress.   Neck: No JVD Cardiac: RRR, no murmurs, rubs, or gallops.  Respiratory: Clear to auscultation bilaterally. GI: Soft, nontender, non-distended  MS: No edema; No deformity. Neuro:  Nonfocal  Psych: Normal affect   Labs    Chemistry  Recent Labs Lab  03/27/17 1026 03/28/17 0448 03/29/17 0235  NA 142 141 141  K 3.4* 3.7 3.5  CL 107 106 106  CO2 27 26 27   GLUCOSE 87 87 92  BUN 22* 21* 24*  CREATININE 1.70* 1.89* 2.16*  CALCIUM 8.2* 8.2* 8.4*  GFRNONAA 36* 31* 27*  GFRAA 41* 36* 31*  ANIONGAP 8 9 8      Hematology  Recent Labs Lab 03/26/17 1735 03/27/17 1026 03/28/17 0448  WBC 5.2 4.9 5.4  RBC 3.04* 3.07* 3.26*  HGB 8.3* 8.5* 8.8*  HCT 25.8* 25.9* 27.5*  MCV 84.9 84.4 84.4  MCH 27.3 27.7 27.0  MCHC 32.2 32.8 32.0  RDW 17.8* 18.3* 17.9*  PLT 153 134* 147*    Cardiac Enzymes  Recent Labs Lab 03/26/17 2216 03/27/17 0327 03/27/17 1026  TROPONINI 0.03* 0.03* <0.03     Recent Labs Lab 03/26/17 1745 03/26/17 2128  TROPIPOC 0.03 0.03  BNP  Recent Labs Lab 03/26/17 2216  BNP 1,148.0*     DDimer No results for input(s): DDIMER in the last 168 hours.   Radiology    No results found.  Cardiac Studies    Patient Profile         83 y.o. male  With a history of prior MI with stents x2 in 10/2016 in [place], chronic systolic HF LVEF 96-04% with AICD, DM2, HTN, HL, COPD, CKD III admitted with chest pain, fatigue, and sob. Multiple admissions over the last 2 months with SOB, volume overload, chest pain. Reports woke up  "feeling lousy". Had fatigue and weakness. Later in the morning developed chest pain starting around 11 AM. 8/10 dull pain left sided, +SOB, +palpitations. Of note he has had several admissions over the past 2 months for SOB, volume overload and atypical CP. Assessment & Plan    1. Chest pain - atypical symptoms, on admission had been ongoing constant for 8.5 hours. - no evidence of ACS by enzymes or EKG - intermittent chest pain symptoms over the last month. Though they are atypical, given his history still have some concern about possible CAD -plan for lexiscan today  2. Acute on chronic systolic HF - negative 3 liters this admission, he is back on his oral lasix regimen. Renal  function stable - - continue home regimen, no ACE/ARB/adlactone given his poor renal function.   3. CAD - continue home regimen including DAPT given his prior stents in 10/2016  4. AKI - hold lasix today Signed, Carlyle Dolly, MD  03/29/2017, 8:19 AM

## 2017-03-29 NOTE — Progress Notes (Signed)
PROGRESS NOTE   David Guerra  OAC:[id number]    DOB: Apr 16, 1933    DOA: 03/26/2017  PCP: System, Pcp Not In   I have briefly reviewed patients previous medical records in [facility].  Brief Narrative:  83 year old male, [place] resident, visiting his daughter in [name] since March 2018 and recuperating from recent hospitalization in [place], [place] of MI, status post 3 hospitalizations between 10/2016-12/2016 when he underwent 2 coronary stents and AICD placement (per his report), CAD, ICM, chronic combined systolic and diastolic CHF, COPD, stage III CKD, DM 2, HTN, PUD, multiple myeloma admitted on 03/26/17 with new onset chest pain, fatigue and dyspnea. This is his third admission in the last 6 months. Admitted for chest pain evaluation. Cardiology consulting and unable to obtain records from OSH. S/P Lexiscan Myoview 03/29/17>fixed defect/low risk. Monitoring overnight regarding acute kidney injury.   Assessment & Plan:   Principal Problem:   Chest pain Active Problems:   HTN (hypertension)   AICD (automatic cardioverter/defibrillator) present   Diabetes ([place])   CKD (chronic kidney disease), stage III   Chronic combined systolic and diastolic CHF (congestive heart failure) (HCC)   Anemia   1. Chest pain, atypical: EKG without acute changes. Troponin 4: Negative despite presenting with several hours of chest pain. ? Musculoskeletal versus ischemic. Cardiology follow-up appreciated. Discussed with Dr. Radford Pax. Unable to obtain medical records from OSH yesterday. Chest pain resolved. Lexiscan Myoview results as below-fixed defects/low risk. As per d/w Cardiology, stable from this standpoint for discharge but monitor overnight to make sure renal functions are stable. 2. MI history/CAD status post stenting 2: Continue aspirin, Plavix, statins and beta blockers. Management as above. 3. Type II DM with renal complications: CBGs to tightly controlled. Changed SSI to sensitive without bedtime scale.  Monitor closely. Better. 4. Essential hypertension: Fluctuating and mildly uncontrolled at times. Continue carvedilol, hydralazine, Imdur. 5. Acute on chronic combined CHF: LVEF 35-40 percent by echo 02/2017. Status post AICD in the last 6 months in [place]. Status post Lasix 60 mg IV 1 on admission and -2.9 L. Was continued on home dose of Lasix 60 mg twice a day, continue hydralazine and Imdur. No ACEI/ARB or Aldactone due to renal insufficiency. As discussed with cardiology, unclear as to why he is on amiodarone. Creatinine has increased from 1.89-2.16. Lasix discontinued. As discussed with cardiology, monitor overnight and recheck BMP in a.m. to ensure improving or stable prior to discharge. Discussed with patient's granddaughter and patient and they verbalize understanding. 6. Acute Stage III chronic kidney disease: Baseline creatinine 1.8-1.9. Presented with creatinine of 2.12. Creatinine had improved but worsened to 2.16. Lasix discontinued. Follow BMP in a.m. 7. Normocytic anemia, chronic: Baseline creatinine in the 8-9 range. Stable. Possibly related to chronic kidney disease. Outpatient evaluation as deemed necessary. 8. Thrombocytopenia, chronic: Stable. 9. Hypokalemia: Replaced   DVT prophylaxis: Lovenox Code Status: Full Family Communication: Discussed with patient's granddaughter. Disposition: DC home when medically stable, possibly 5/7   Consultants:  Cardiology   Procedures:  None  Antimicrobials:  None    Subjective: No further chest pain. Denies dyspnea.  ROS: No dizziness or lightheadedness reported. States that he lives with his daughter currently and ambulates with the help of a cane. Does not use home oxygen.  Objective:  Vitals:   03/29/17 1015 03/29/17 1016 03/29/17 1018 03/29/17 1316  BP: (!) 179/79 138/68 131/62 127/64  Pulse:    60  Resp:      Temp:    98.1 F (36.7 C)  TempSrc:    Oral  SpO2:    100%  Weight:      Height:         Examination:  General exam: Pleasant elderly male lying comfortably supine in bed. Respiratory system: Occasional basal crackles, rest clear to auscultation. Respiratory effort normal. Cardiovascular system: S1 & S2 heard, RRR. No JVD, murmurs, rubs, gallops or clicks. Trace bilateral ankle edema. Telemetry: AV paced rhythm. Gastrointestinal system: Abdomen is nondistended, soft and nontender. No organomegaly or masses felt. Normal bowel sounds heard. Central nervous system: Alert and oriented. No focal neurological deficits. Extremities: Symmetric 5 x 5 power. Skin: No rashes, lesions or ulcers Psychiatry: Judgement and insight appear normal. Mood & affect appropriate.     Data Reviewed: I have personally reviewed following labs and imaging studies  CBC:  Recent Labs Lab 03/26/17 1735 03/27/17 1026 03/28/17 0448  WBC 5.2 4.9 5.4  HGB 8.3* 8.5* 8.8*  HCT 25.8* 25.9* 27.5*  MCV 84.9 84.4 84.4  PLT 153 134* 626*   Basic Metabolic Panel:  Recent Labs Lab 03/26/17 1735 03/27/17 1026 03/28/17 0448 03/29/17 0235  NA 141 142 141 141  K 4.1 3.4* 3.7 3.5  CL 111 107 106 106  CO2 27 27 26 27   GLUCOSE 80 87 87 92  BUN 28* 22* 21* 24*  CREATININE 2.12* 1.70* 1.89* 2.16*  CALCIUM 8.3* 8.2* 8.2* 8.4*   Cardiac Enzymes:  Recent Labs Lab 03/26/17 2216 03/27/17 0327 03/27/17 1026  TROPONINI 0.03* 0.03* <0.03   CBG:  Recent Labs Lab 03/28/17 1651 03/28/17 2148 03/29/17 0741 03/29/17 1117 03/29/17 1653  GLUCAP 115* 125* 85 140* 116*    No results found for this or any previous visit (from the past 240 hour(s)).       Radiology Studies: Nm Myocar Multi W/spect W/wall Motion / Ef  Result Date: 03/29/2017 CLINICAL DATA:  Chest pain and history of myocardial infarction. EXAM: MYOCARDIAL IMAGING WITH SPECT (REST AND PHARMACOLOGIC-STRESS) GATED LEFT VENTRICULAR WALL MOTION STUDY LEFT VENTRICULAR EJECTION FRACTION TECHNIQUE: Standard myocardial SPECT imaging was  performed after resting intravenous injection of 10 mCi Tc-34mtetrofosmin. Subsequently, intravenous infusion of Lexiscan was performed under the supervision of the Cardiology staff. At peak effect of the drug, 30 mCi Tc-932metrofosmin was injected intravenously and standard myocardial SPECT imaging was performed. Quantitative gated imaging was also performed to evaluate left ventricular wall motion, and estimate left ventricular ejection fraction. COMPARISON:  None. FINDINGS: Perfusion: There is a medium sized fixed defect involving the mid inferior wall. Wall Motion: Wall motion abnormality with hypokinesis of the inferior and lateral walls. Left Ventricular Ejection Fraction: 38 % End diastolic volume 20948l End systolic volume 12546l IMPRESSION: 1. Single medium sized fixed defect involving the mid inferior wall. No reversibility. 2. Inferolateral wall hypokinesis. 3. Left ventricular ejection fraction 38% 4. Non invasive risk stratification*: Low *2012 Appropriate Use Criteria for Coronary Revascularization Focused Update: J Am Coll Cardiol. 202703;50(0):[email or web address] Electronically Signed   By: TaKerby Moors.D.   On: 03/29/2017 13:30        Scheduled Meds: . amiodarone  200 mg Oral Daily  . aspirin EC  81 mg Oral Daily  . atorvastatin  40 mg Oral QHS  . carvedilol  12.5 mg Oral BID AC  . clopidogrel  75 mg Oral Daily  . enoxaparin (LOVENOX) injection  30 mg Subcutaneous QHS  . fluticasone  1 spray Each Nare QHS  . hydrALAZINE  50 mg Oral Q6H  .  insulin aspart  0-9 Units Subcutaneous TID WC  . isosorbide dinitrate  30 mg Oral TID  . latanoprost  1 drop Both Eyes QHS  . potassium chloride SA  20 mEq Oral BID  . regadenoson      . tamsulosin  0.4 mg Oral Daily  . vitamin B-12  1,000 mcg Oral Daily   Continuous Infusions:   LOS: 2 days     Sherylann Vangorden, MD, FACP, FHM. [facility] Pager [phone]  If 7PM-7AM,  please contact night-coverage [email or web address] Password [email or web address] 03/29/2017, 5:20 PM

## 2017-03-30 DIAGNOSIS — I5042 Chronic combined systolic (congestive) and diastolic (congestive) heart failure: Secondary | ICD-10-CM

## 2017-03-30 LAB — CBC
HEMATOCRIT: 25.4 % — AB (ref 39.0–52.0)
HEMOGLOBIN: 8.1 g/dL — AB (ref 13.0–17.0)
MCH: 26.1 pg (ref 26.0–34.0)
MCHC: 31.9 g/dL (ref 30.0–36.0)
MCV: 81.9 fL (ref 78.0–100.0)
Platelets: 149 10*3/uL — ABNORMAL LOW (ref 150–400)
RBC: 3.1 MIL/uL — ABNORMAL LOW (ref 4.22–5.81)
RDW: 17.9 % — AB (ref 11.5–15.5)
WBC: 4.9 10*3/uL (ref 4.0–10.5)

## 2017-03-30 LAB — BASIC METABOLIC PANEL
Anion gap: 6 (ref 5–15)
BUN: 20 mg/dL (ref 6–20)
CHLORIDE: 107 mmol/L (ref 101–111)
CO2: 26 mmol/L (ref 22–32)
Calcium: 8 mg/dL — ABNORMAL LOW (ref 8.9–10.3)
Creatinine, Ser: 1.83 mg/dL — ABNORMAL HIGH (ref 0.61–1.24)
GFR calc Af Amer: 38 mL/min — ABNORMAL LOW (ref >60)
GFR calc non Af Amer: 33 mL/min — ABNORMAL LOW (ref >60)
GLUCOSE: 92 mg/dL (ref 65–99)
Potassium: 3.4 mmol/L — ABNORMAL LOW (ref 3.5–5.1)
Sodium: 139 mmol/L (ref 135–145)

## 2017-03-30 LAB — GLUCOSE, CAPILLARY
Glucose-Capillary: 86 mg/dL (ref 65–99)
Glucose-Capillary: 172 mg/dL — ABNORMAL HIGH (ref 65–99)

## 2017-03-30 MED ORDER — ISOSORBIDE DINITRATE 30 MG PO TABS: 30.0000 mg | ORAL_TABLET | Freq: Three times a day (TID) | ORAL | 0 refills | Status: AC

## 2017-03-30 MED ORDER — NITROGLYCERIN 0.4 MG SL SUBL: 0.4000 mg | SUBLINGUAL_TABLET | SUBLINGUAL | 0 refills | Status: AC | PRN

## 2017-03-30 MED ORDER — HYDRALAZINE HCL 50 MG PO TABS: 50.0000 mg | ORAL_TABLET | Freq: Four times a day (QID) | ORAL | 0 refills | Status: DC

## 2017-03-30 MED ORDER — FUROSEMIDE 20 MG PO TABS: 40.0000 mg | ORAL_TABLET | Freq: Two times a day (BID) | ORAL | 0 refills | Status: DC

## 2017-03-30 NOTE — Discharge Summary (Addendum)
Physician Discharge Summary  [facility] YQM:[id number] DOB: 24-Oct-1933  PCP: System, Pcp Not In  Admit date: 03/26/2017 Discharge date: 03/30/2017  Recommendations for Outpatient Follow-up:  1. PCP/Cardiologist in [place]. His physicians are in [place] and patient was visiting [place] and recuperating while staying with his daughter in [place]. He plans to return to [place] in the next 2-3 days from hospital discharge. 2. Dr. Jenkins Rouge, Cardiology: May follow-up if he decides to stay in [facility] for an extended period of time.  Home Health: None Equipment/Devices: None. Patient ambulated with a cane prior to admission.    Discharge Condition: Improved and stable  CODE STATUS: Full  Diet recommendation: Heart healthy and diabetic diet  Discharge Diagnoses:  Principal Problem:   Chest pain Active Problems:   HTN (hypertension)   AICD (automatic cardioverter/defibrillator) present   Diabetes ([place])   CKD (chronic kidney disease), stage III   Chronic combined systolic and diastolic CHF (congestive heart failure) (HCC)   Anemia   Brief Summary: 83 year old male, [place] resident, visiting his daughter in [place] since March 2018 and recuperating from recent hospitalization in [place], [place] of [place], status post 3 hospitalizations between 10/2016-12/2016 when he underwent 2 coronary stents and AICD placement (per his report), CAD, ICM, chronic combined systolic and diastolic CHF, COPD, stage III CKD, DM 2, HTN, PUD, multiple myeloma admitted on 03/26/17 with new onset chest pain, fatigue and dyspnea. This is his third admission in the last 6 months. Admitted for chest pain evaluation.    Assessment & Plan:   1. Chest pain, atypical: EKG without acute changes. Troponin 4: Negative despite presenting with several hours of chest pain. ? Musculoskeletal versus ischemic. Cardiology was consulted and were unable to obtain his medical records from OSH. Chest pain resolved. Lexiscan  Myoview results as below-fixed defects/low risk. Despite the low risk Myoview, he was monitored overnight to ensure that his renal functions were stable. Discussed with Cardiology and their follow-up appreciated. 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 Patient's Cardiologist is in [place] and Cardiology here have cleared him for discharge and patient is to take the train back to [place] with plans to leave early this week. He was provided a copy of the DC summary to review with his physicians in [place]. As discussed with daughter, sublingual nitroglycerin has helped him in the past and a prescription will be provided for same. 2. MI history/CAD status post stenting 2: Continue aspirin, Plavix, statins and beta blockers. Management as above. 3. Type II DM with renal complications: CBGs too tightly controlled. Changed SSI to sensitive without bedtime scale. Patient has not needed much or any SSI while hospitalized. It appears that he had not taken his Glucotrol for a couple days prior to admission. A1c 5.7. His diabetes may be well controlled by diet alone. High risk for hypoglycemia from Glucotrol in the context of renal insufficiency. Glucotrol was discontinued, continue monitoring CBGs and can follow up with his PCP regarding further management. 4. Essential hypertension: Fluctuating and mildly uncontrolled at times. Continue carvedilol, hydralazine, Imdur. 5. Acute on chronic combined CHF: LVEF 35-40 percent by echo 02/2017. Status post AICD in the last 6 months in [place]. Status post Lasix 60 mg IV 1 on admission and -2.115 L since admission. Was continued on home dose of Lasix 60 mg twice a day, continue hydralazine and Imdur. No ACEI/ARB or Aldactone due to renal insufficiency. As discussed with Cardiology,  unclear as to why he is on amiodarone >can be addressed by his primary Ccardiologist in [place] . On 5/6, Creatinine had  increased from 1.89 to 2.16. Lasix was temporarily held for a day. Creatinine has come down to 1.8. As per cardiology, discharge on reduced Lasix 40 mg twice a day and can follow-up with his physicians in [place] with repeat labs/BMP in 5-7 days. 6. Acute Stage III chronic kidney disease: Baseline creatinine 1.8-1.9. Presented with creatinine of 2.12. Creatinine had improved but worsened to 2.16 on 5/6. Lasix temporarily held. Creatinine has improved back to 1.8. Management of diuretics as above. Follow BMP in a few days as outpatient. 7. Normocytic anemia, chronic: Baseline creatinine in the 8-9 range. Stable. Possibly related to chronic kidney disease. Multiple myeloma is documented in his history and not sure the status of this. Close outpatient follow-up with his PCP and evaluation and management as deemed necessary. 8. Thrombocytopenia, chronic: Stable. 9. Hypokalemia:  continue home potassium replacements.   Consultants:  Cardiology   Procedures:  None   Discharge Instructions  Discharge Instructions    (HEART FAILURE PATIENTS) Call MD:  Anytime you have any of the following symptoms: 1) 3 pound weight gain in 24 hours or 5 pounds in 1 week 2) shortness of breath, with or without a dry hacking cough 3) swelling in the hands, feet or stomach 4) if you have to sleep on extra pillows at night in order to breathe.    Complete by:  As directed    Call MD for:  difficulty breathing, headache or visual disturbances    Complete by:  As directed    Call MD for:  extreme fatigue    Complete by:  As directed    Call MD for:  persistant dizziness or light-headedness    Complete by:  As directed    Call MD for:  severe uncontrolled pain    Complete by:  As directed    Diet - low sodium heart healthy    Complete by:  As directed    Diet Carb Modified    Complete by:  As directed    Increase activity slowly    Complete by:  As directed        Medication List    STOP taking these  medications   glipiZIDE 5 MG tablet Commonly known as:  GLUCOTROL     TAKE these medications   allopurinol 300 MG tablet Commonly known as:  ZYLOPRIM Take 300 mg by mouth daily. For gout   amiodarone 200 MG tablet Commonly known as:  PACERONE Take 1 tablet (200 mg total) by mouth daily.   aspirin EC 81 MG tablet Take 81 mg by mouth daily. For heart   atorvastatin 40 MG tablet Commonly known as:  LIPITOR Take 40 mg by mouth at bedtime. For lowering cholesterol   carvedilol 12.5 MG tablet Commonly known as:  COREG Take 12.5 mg by mouth every 12 (twelve) hours. For heart   clopidogrel 75 MG tablet Commonly known as:  PLAVIX Take 75 mg by mouth daily. For blood thinning   fluticasone 50 MCG/ACT nasal spray Commonly known as:  FLONASE Place 1 spray into both nostrils at bedtime.   furosemide 20 MG tablet Commonly known as:  LASIX Take 2 tablets (40 mg total) by mouth 2 (two) times daily. What changed:  how much to take   hydrALAZINE 50 MG tablet Commonly known as:  APRESOLINE Take 1 tablet (50 mg total) by  mouth every 6 (six) hours.   isosorbide dinitrate 30 MG tablet Commonly known as:  ISORDIL Take 1 tablet (30 mg total) by mouth 3 (three) times daily.   latanoprost 0.005 % ophthalmic solution Commonly known as:  XALATAN Place 1 drop into both eyes at bedtime.   nitroGLYCERIN 0.4 MG SL tablet Commonly known as:  NITROSTAT Place 1 tablet (0.4 mg total) under the tongue every 5 (five) minutes as needed for chest pain. Up to 3 doses per episode of chest pain.   potassium chloride SA 20 MEQ tablet Commonly known as:  K-DUR,KLOR-CON Take 20 mEq by mouth 2 (two) times daily.   tamsulosin 0.4 MG Caps capsule Commonly known as:  FLOMAX Take 0.4 mg by mouth daily.   vitamin B-12 1000 MCG tablet Commonly known as:  CYANOCOBALAMIN Take 1,000 mcg by mouth daily.      Follow-up Information    Josue Hector, MD Follow up.   Specialty:  Cardiology Why:  our  office will call you with a follow-up appointment  Contact information: [address] [name] [address] [phone]        Family Doctor/Cardiologist in [place].. Schedule an appointment as soon as possible for a visit.   Why:  to be seen in 5-7 days with repeat labs (CBC & BMP). To be reassessed regarding diabetes, HTN, CHF.         No Known Allergies    Procedures/Studies: Dg Chest 2 View  Result Date: 03/26/2017 CLINICAL DATA:  LEFT chest pain and shortness of breath, history of coronary artery disease post MI and coronary stenting, hypertension, diabetes mellitus, multiple myeloma EXAM: CHEST  2 VIEW COMPARISON:  03/05/2017 FINDINGS: LEFT subclavian transvenous pacemaker/AICD leads project at RIGHT atrium and RIGHT ventricle unchanged since previous exam. Enlargement of cardiac silhouette with pulmonary vascular congestion. Stable mediastinal contours. Chronic bronchitic changes with RIGHT basilar atelectasis. Question small RIGHT pleural effusion No acute infiltrate, LEFT pleural effusion or pneumothorax. Bones demineralized with endplate spur formation thoracic spine. IMPRESSION: Enlargement of cardiac silhouette with pulmonary vascular congestion. Bronchitic changes with RIGHT basilar atelectasis and suspect small RIGHT pleural effusion. No acute infiltrate. Electronically Signed   By: Lavonia Dana M.D.   On: 03/26/2017 17:48   Dg Chest 2 View  Result Date: 03/05/2017 CLINICAL DATA:  Semi productive cough for 3 days now, headache, abd and chest pain as well, having a lot of SOB as well - hx of MI back in December with 2 stents and a defibrillator being placed - hx of multiple myeloma, CHF, htn, diabetes, CAD, kidney disease EXAM: CHEST  2 VIEW COMPARISON:  03/04/2017 FINDINGS: LEFT-sided pacemaker overlies stable cardiac silhouette. There is perihilar linear markings similar prior. No focal consolidation. No pneumothorax. Small effusions. IMPRESSION: 1.  Chronic bronchitic markings. 2. Small effusions. 3. No overt pulmonary edema or pneumonia identified. Electronically Signed   By: Suzy Bouchard M.D.   On: 03/05/2017 13:59   Nm Myocar Multi W/spect W/wall Motion / Ef  Result Date: 03/29/2017 CLINICAL DATA:  Chest pain and history of myocardial infarction. EXAM: MYOCARDIAL IMAGING WITH SPECT (REST AND PHARMACOLOGIC-STRESS) GATED LEFT VENTRICULAR WALL MOTION STUDY LEFT VENTRICULAR EJECTION FRACTION TECHNIQUE: Standard myocardial SPECT imaging was performed after resting intravenous injection of 10 mCi Tc-54mtetrofosmin. Subsequently, intravenous infusion of Lexiscan was performed under the supervision of the Cardiology staff. At peak effect of the drug, 30 mCi Tc-914metrofosmin was injected intravenously and standard myocardial SPECT imaging was performed. Quantitative  gated imaging was also performed to evaluate left ventricular wall motion, and estimate left ventricular ejection fraction. COMPARISON:  None. FINDINGS: Perfusion: There is a medium sized fixed defect involving the mid inferior wall. Wall Motion: Wall motion abnormality with hypokinesis of the inferior and lateral walls. Left Ventricular Ejection Fraction: 38 % End diastolic volume 591 ml End systolic volume 638 ml IMPRESSION: 1. Single medium sized fixed defect involving the mid inferior wall. No reversibility. 2. Inferolateral wall hypokinesis. 3. Left ventricular ejection fraction 38% 4. Non invasive risk stratification*: Low *2012 Appropriate Use Criteria for Coronary Revascularization Focused Update: J Am Coll Cardiol. 4665;99(3):570-177. http://content.[email or web address]?[email or web address] Electronically Signed   By: Kerby Moors M.D.   On: 03/29/2017 13:30      Subjective: Seen this morning. No complaints reported. Anxious to go home. Denies chest pain, dyspnea, dizziness, lightheadedness or palpitations.  Discharge Exam:  Vitals:   03/29/17 1808 03/29/17 2004  03/30/17 0030 03/30/17 0535  BP: 140/69 132/67 127/68 (!) 146/71  Pulse:  65    Resp:      Temp:  98.1 F (36.7 C)  97.6 F (36.4 C)  TempSrc:  Oral    SpO2:  95%  97%  Weight:    85.9 kg (189 lb 4.8 oz)  Height:        General exam: Pleasant elderly male lying comfortably supine in bed. Respiratory system: clear to auscultation. Respiratory effort normal. Cardiovascular system: S1 & S2 heard, RRR. No JVD, murmurs, rubs, gallops or clicks. No ankle edema. Telemetry: Atrial paced rhythm. Gastrointestinal system: Abdomen is nondistended, soft and nontender. No organomegaly or masses felt. Normal bowel sounds heard. Central nervous system: Alert and oriented. No focal neurological deficits. Extremities: Symmetric 5 x 5 power. Skin: No rashes, lesions or ulcers Psychiatry: Judgement and insight appear normal. Mood & affect appropriate.    The results of significant diagnostics from this hospitalization (including imaging, microbiology, ancillary and laboratory) are listed below for reference.      Labs: CBC:  Recent Labs Lab 03/26/17 1735 03/27/17 1026 03/28/17 0448 03/30/17 0419  WBC 5.2 4.9 5.4 4.9  HGB 8.3* 8.5* 8.8* 8.1*  HCT 25.8* 25.9* 27.5* 25.4*  MCV 84.9 84.4 84.4 81.9  PLT 153 134* 147* 939*   Basic Metabolic Panel:  Recent Labs Lab 03/26/17 1735 03/27/17 1026 03/28/17 0448 03/29/17 0235 03/30/17 0419  NA 141 142 141 141 139  K 4.1 3.4* 3.7 3.5 3.4*  CL 111 107 106 106 107  CO2 27 27 26 27 26   GLUCOSE 80 87 87 92 92  BUN 28* 22* 21* 24* 20  CREATININE 2.12* 1.70* 1.89* 2.16* 1.83*  CALCIUM 8.3* 8.2* 8.2* 8.4* 8.0*   BNP (last 3 results)  Recent Labs  02/21/17 2027 02/22/17 0423 03/26/17 2216  BNP 941.9* 1,024.3* 1,148.0*   Cardiac Enzymes:  Recent Labs Lab 03/26/17 2216 03/27/17 0327 03/27/17 1026  TROPONINI 0.03* 0.03* <0.03   CBG:  Recent Labs Lab 03/29/17 1117 03/29/17 1653 03/29/17 2008 03/30/17 0728 03/30/17 1119   GLUCAP 140* 116* 103* 86 172*   Discussed with patient's granddaughter and updated care on 03/29/17 and discussed in detail with patient's daughter via phone and updated her on his care and answered questions on 03/30/17.  Time coordinating discharge: Over 30 minutes  SIGNED:  Vernell Leep, MD, FACP, [place]. [facility] Pager [phone]  If 7PM-7AM, please contact night-coverage [email or web address] Password [email or web address] 03/30/2017, 12:05 PM

## 2017-03-30 NOTE — Progress Notes (Signed)
Progress Note  Patient Name: David Guerra Date of Encounter: 03/30/2017  Primary Cardiologist:   Subjective  No chest pain onernight wants to go home   Inpatient Medications    Scheduled Meds: . amiodarone  200 mg Oral Daily  . aspirin EC  81 mg Oral Daily  . atorvastatin  40 mg Oral QHS  . carvedilol  12.5 mg Oral BID AC  . clopidogrel  75 mg Oral Daily  . enoxaparin (LOVENOX) injection  30 mg Subcutaneous QHS  . fluticasone  1 spray Each Nare QHS  . hydrALAZINE  50 mg Oral Q6H  . insulin aspart  0-9 Units Subcutaneous TID WC  . isosorbide dinitrate  30 mg Oral TID  . latanoprost  1 drop Both Eyes QHS  . potassium chloride SA  20 mEq Oral BID  . tamsulosin  0.4 mg Oral Daily  . vitamin B-12  1,000 mcg Oral Daily   Continuous Infusions:  PRN Meds: acetaminophen, albuterol, gi cocktail, morphine injection, nitroGLYCERIN, ondansetron (ZOFRAN) IV   Vital Signs    Vitals:   03/29/17 1808 03/29/17 2004 03/30/17 0030 03/30/17 0535  BP: 140/69 132/67 127/68 (!) 146/71  Pulse:  65    Resp:      Temp:  98.1 F (36.7 C)  97.6 F (36.4 C)  TempSrc:  Oral    SpO2:  95%  97%  Weight:    189 lb 4.8 oz (85.9 kg)  Height:        Intake/Output Summary (Last 24 hours) at 03/30/17 0837 Last data filed at 03/30/17 0533  Gross per 24 hour  Intake             1080 ml  Output              450 ml  Net              630 ml   Filed Weights   03/28/17 0347 03/29/17 0500 03/30/17 0535  Weight: 182 lb 14.4 oz (83 kg) 185 lb 12.8 oz (84.3 kg) 189 lb 4.8 oz (85.9 kg)    Telemetry     NSR with a pacing - Personally Reviewed  ECG     NSR no acute ST changes - Personally Reviewed  Physical Exam  Affect appropriate Healthy:  appears stated age HEENT: normal Neck supple with no adenopathy JVP normal no bruits no thyromegaly Lungs clear with no wheezing and good diaphragmatic motion Heart:  S1/S2 no murmur, no rub, gallop or click PMI normal Abdomen: benighn, BS positve, no  tenderness, no AAA no bruit.  No HSM or HJR Distal pulses intact with no bruits No edema Neuro non-focal Skin warm and dry No muscular weakness    Labs    Chemistry  Recent Labs Lab 03/28/17 0448 03/29/17 0235 03/30/17 0419  NA 141 141 139  K 3.7 3.5 3.4*  CL 106 106 107  CO2 26 27 26   GLUCOSE 87 92 92  BUN 21* 24* 20  CREATININE 1.89* 2.16* 1.83*  CALCIUM 8.2* 8.4* 8.0*  GFRNONAA 31* 27* 33*  GFRAA 36* 31* 38*  ANIONGAP 9 8 6      Hematology  Recent Labs Lab 03/27/17 1026 03/28/17 0448 03/30/17 0419  WBC 4.9 5.4 4.9  RBC 3.07* 3.26* 3.10*  HGB 8.5* 8.8* 8.1*  HCT 25.9* 27.5* 25.4*  MCV 84.4 84.4 81.9  MCH 27.7 27.0 26.1  MCHC 32.8 32.0 31.9  RDW 18.3* 17.9* 17.9*  PLT 134* 147* 149*  Cardiac Enzymes  Recent Labs Lab 03/26/17 2216 03/27/17 0327 03/27/17 1026  TROPONINI 0.03* 0.03* <0.03     Recent Labs Lab 03/26/17 1745 03/26/17 2128  TROPIPOC 0.03 0.03     BNP  Recent Labs Lab 03/26/17 2216  BNP 1,148.0*     DDimer No results for input(s): DDIMER in the last 168 hours.   Radiology    Nm Myocar Multi W/spect W/wall Motion / Ef  Result Date: 03/29/2017 CLINICAL DATA:  Chest pain and history of myocardial infarction. EXAM: MYOCARDIAL IMAGING WITH SPECT (REST AND PHARMACOLOGIC-STRESS) GATED LEFT VENTRICULAR WALL MOTION STUDY LEFT VENTRICULAR EJECTION FRACTION TECHNIQUE: Standard myocardial SPECT imaging was performed after resting intravenous injection of 10 mCi Tc-25m tetrofosmin. Subsequently, intravenous infusion of Lexiscan was performed under the supervision of the Cardiology staff. At peak effect of the drug, 30 mCi Tc-20m tetrofosmin was injected intravenously and standard myocardial SPECT imaging was performed. Quantitative gated imaging was also performed to evaluate left ventricular wall motion, and estimate left ventricular ejection fraction. COMPARISON:  None. FINDINGS: Perfusion: There is a medium sized fixed defect involving  the mid inferior wall. Wall Motion: Wall motion abnormality with hypokinesis of the inferior and lateral walls. Left Ventricular Ejection Fraction: 38 % End diastolic volume 720 ml End systolic volume 947 ml IMPRESSION: 1. Single medium sized fixed defect involving the mid inferior wall. No reversibility. 2. Inferolateral wall hypokinesis. 3. Left ventricular ejection fraction 38% 4. Non invasive risk stratification*: Low *2012 Appropriate Use Criteria for Coronary Revascularization Focused Update: J Am Coll Cardiol. 0962;83(6):629-476. http://content.[email or web address]?articleid=[email or web address] Electronically Signed   By: Kerby Moors M.D.   On: 03/29/2017 13:30    Cardiac Studies    Patient Profile         83 y.o. male  With a history of prior MI with stents x2 in 10/2016 in [place], chronic systolic HF LVEF 54-65% with AICD, DM2, HTN, HL, COPD, CKD III admitted with chest pain, fatigue, and sob. Multiple admissions over the last 2 months with SOB, volume overload, chest pain. Reports woke up  "feeling lousy". Had fatigue and weakness. Later in the morning developed chest pain starting around 11 AM. 8/10 dull pain left sided, +SOB, +palpitations. Of note he has had several admissions over the past 2 months for SOB, volume overload and atypical CP. Assessment & Plan    1. Chest pain Myovue reviewed old inferolateral MI no ischemia no indication for cath continue medical Rx Has cardiologist in [place] to d/c and take train back to [place]. He is living early this week and will Not need TOC appt with [place] would copy d/c summary for him to take back home   2. Acute on chronic systolic HF Decrease lasix to 40 bid and continue kdur at same dose as K low during hospitalization    3. CAD - continue home regimen including DAPT given his prior stents in 10/2016  4. CRF:  Cr stable this am see above decrease lasix dose   Signed, Jenkins Rouge, MD  03/30/2017, 8:37 AM

## 2017-03-31 ENCOUNTER — Inpatient Hospital Stay (HOSPITAL_COMMUNITY)
Admission: EM | Admit: 2017-03-31 | Discharge: 2017-04-02 | DRG: 291 | Disposition: A | Discharge status: Home / Self Care | Payer: Medicare Other | Attending: Internal Medicine | Admitting: Internal Medicine

## 2017-03-31 ENCOUNTER — Encounter (HOSPITAL_COMMUNITY): Payer: Self-pay

## 2017-03-31 ENCOUNTER — Emergency Department (HOSPITAL_COMMUNITY): Payer: Medicare Other

## 2017-03-31 ENCOUNTER — Other Ambulatory Visit: Payer: Self-pay

## 2017-03-31 DIAGNOSIS — Z7902 Long term (current) use of antithrombotics/antiplatelets: Secondary | ICD-10-CM

## 2017-03-31 DIAGNOSIS — I5043 Acute on chronic combined systolic (congestive) and diastolic (congestive) heart failure: Secondary | ICD-10-CM | POA: Diagnosis not present

## 2017-03-31 DIAGNOSIS — I2510 Atherosclerotic heart disease of native coronary artery without angina pectoris: Secondary | ICD-10-CM | POA: Diagnosis present

## 2017-03-31 DIAGNOSIS — Z8546 Personal history of malignant neoplasm of prostate: Secondary | ICD-10-CM

## 2017-03-31 DIAGNOSIS — D649 Anemia, unspecified: Secondary | ICD-10-CM | POA: Diagnosis present

## 2017-03-31 DIAGNOSIS — N183 Chronic kidney disease, stage 3 unspecified: Secondary | ICD-10-CM | POA: Diagnosis present

## 2017-03-31 DIAGNOSIS — Z794 Long term (current) use of insulin: Secondary | ICD-10-CM

## 2017-03-31 DIAGNOSIS — Z86718 Personal history of other venous thrombosis and embolism: Secondary | ICD-10-CM

## 2017-03-31 DIAGNOSIS — Z9861 Coronary angioplasty status: Secondary | ICD-10-CM

## 2017-03-31 DIAGNOSIS — R042 Hemoptysis: Secondary | ICD-10-CM | POA: Diagnosis present

## 2017-03-31 DIAGNOSIS — I10 Essential (primary) hypertension: Secondary | ICD-10-CM | POA: Diagnosis not present

## 2017-03-31 DIAGNOSIS — I130 Hypertensive heart and chronic kidney disease with heart failure and stage 1 through stage 4 chronic kidney disease, or unspecified chronic kidney disease: Secondary | ICD-10-CM | POA: Diagnosis not present

## 2017-03-31 DIAGNOSIS — I480 Paroxysmal atrial fibrillation: Secondary | ICD-10-CM | POA: Diagnosis present

## 2017-03-31 DIAGNOSIS — Z7982 Long term (current) use of aspirin: Secondary | ICD-10-CM

## 2017-03-31 DIAGNOSIS — Z7901 Long term (current) use of anticoagulants: Secondary | ICD-10-CM

## 2017-03-31 DIAGNOSIS — I2583 Coronary atherosclerosis due to lipid rich plaque: Secondary | ICD-10-CM | POA: Diagnosis present

## 2017-03-31 DIAGNOSIS — N400 Enlarged prostate without lower urinary tract symptoms: Secondary | ICD-10-CM | POA: Diagnosis present

## 2017-03-31 DIAGNOSIS — J449 Chronic obstructive pulmonary disease, unspecified: Secondary | ICD-10-CM | POA: Diagnosis present

## 2017-03-31 DIAGNOSIS — C9000 Multiple myeloma not having achieved remission: Secondary | ICD-10-CM | POA: Diagnosis present

## 2017-03-31 DIAGNOSIS — I5023 Acute on chronic systolic (congestive) heart failure: Secondary | ICD-10-CM

## 2017-03-31 DIAGNOSIS — M109 Gout, unspecified: Secondary | ICD-10-CM | POA: Diagnosis present

## 2017-03-31 DIAGNOSIS — E876 Hypokalemia: Secondary | ICD-10-CM | POA: Diagnosis present

## 2017-03-31 DIAGNOSIS — I255 Ischemic cardiomyopathy: Secondary | ICD-10-CM | POA: Diagnosis present

## 2017-03-31 DIAGNOSIS — R0602 Shortness of breath: Secondary | ICD-10-CM | POA: Diagnosis not present

## 2017-03-31 DIAGNOSIS — D631 Anemia in chronic kidney disease: Secondary | ICD-10-CM | POA: Diagnosis present

## 2017-03-31 DIAGNOSIS — Z955 Presence of coronary angioplasty implant and graft: Secondary | ICD-10-CM

## 2017-03-31 DIAGNOSIS — I252 Old myocardial infarction: Secondary | ICD-10-CM

## 2017-03-31 DIAGNOSIS — E1122 Type 2 diabetes mellitus with diabetic chronic kidney disease: Secondary | ICD-10-CM | POA: Diagnosis present

## 2017-03-31 DIAGNOSIS — Z8042 Family history of malignant neoplasm of prostate: Secondary | ICD-10-CM

## 2017-03-31 DIAGNOSIS — R079 Chest pain, unspecified: Secondary | ICD-10-CM

## 2017-03-31 DIAGNOSIS — Z95810 Presence of automatic (implantable) cardiac defibrillator: Secondary | ICD-10-CM

## 2017-03-31 DIAGNOSIS — Z7951 Long term (current) use of inhaled steroids: Secondary | ICD-10-CM

## 2017-03-31 DIAGNOSIS — Z8711 Personal history of peptic ulcer disease: Secondary | ICD-10-CM

## 2017-03-31 DIAGNOSIS — Z79899 Other long term (current) drug therapy: Secondary | ICD-10-CM

## 2017-03-31 HISTORY — DX: Anemia, unspecified: D64.9

## 2017-03-31 LAB — COMPREHENSIVE METABOLIC PANEL
ALT: 52 U/L (ref 17–63)
ANION GAP: 5 (ref 5–15)
AST: 27 U/L (ref 15–41)
Albumin: 3.3 g/dL — ABNORMAL LOW (ref 3.5–5.0)
Alkaline Phosphatase: 55 U/L (ref 38–126)
BILIRUBIN TOTAL: 0.4 mg/dL (ref 0.3–1.2)
BUN: 24 mg/dL — AB (ref 6–20)
CO2: 25 mmol/L (ref 22–32)
Calcium: 8.4 mg/dL — ABNORMAL LOW (ref 8.9–10.3)
Chloride: 110 mmol/L (ref 101–111)
Creatinine, Ser: 1.55 mg/dL — ABNORMAL HIGH (ref 0.61–1.24)
GFR calc Af Amer: 46 mL/min — ABNORMAL LOW (ref >60)
GFR, EST NON AFRICAN AMERICAN: 40 mL/min — AB (ref >60)
Glucose, Bld: 112 mg/dL — ABNORMAL HIGH (ref 65–99)
POTASSIUM: 3.7 mmol/L (ref 3.5–5.1)
Sodium: 140 mmol/L (ref 135–145)
TOTAL PROTEIN: 6.5 g/dL (ref 6.5–8.1)

## 2017-03-31 LAB — I-STAT TROPONIN, ED: TROPONIN I, POC: 0.03 ng/mL (ref 0.00–0.08)

## 2017-03-31 LAB — CBC
HCT: 27 % — ABNORMAL LOW (ref 39.0–52.0)
Hemoglobin: 8.8 g/dL — ABNORMAL LOW (ref 13.0–17.0)
MCH: 27 pg (ref 26.0–34.0)
MCHC: 32.6 g/dL (ref 30.0–36.0)
MCV: 82.8 fL (ref 78.0–100.0)
PLATELETS: 158 10*3/uL (ref 150–400)
RBC: 3.26 MIL/uL — ABNORMAL LOW (ref 4.22–5.81)
RDW: 17.9 % — ABNORMAL HIGH (ref 11.5–15.5)
WBC: 6.7 10*3/uL (ref 4.0–10.5)

## 2017-03-31 LAB — LIPASE, BLOOD: Lipase: 13 U/L (ref 11–51)

## 2017-03-31 LAB — BRAIN NATRIURETIC PEPTIDE: B NATRIURETIC PEPTIDE 5: 1101.3 pg/mL — AB (ref 0.0–100.0)

## 2017-03-31 LAB — TROPONIN I: Troponin I: 0.03 ng/mL (ref <0.03)

## 2017-03-31 MED ORDER — PANTOPRAZOLE SODIUM 40 MG PO TBEC
40.0000 mg | DELAYED_RELEASE_TABLET | Freq: Every day | ORAL | Status: DC
  Administered 2017-04-01 – 2017-04-02 (×2): 40 mg via ORAL
  Filled 2017-03-31 (×2): qty 1

## 2017-03-31 MED ORDER — IOPAMIDOL (ISOVUE-370) INJECTION 76%
100.0000 mL | Freq: Once | INTRAVENOUS | Status: AC | PRN
  Administered 2017-03-31: 80 mL via INTRAVENOUS

## 2017-03-31 MED ORDER — SODIUM CHLORIDE 0.9% FLUSH
3.0000 mL | Freq: Two times a day (BID) | INTRAVENOUS | Status: DC
  Administered 2017-04-01 – 2017-04-02 (×4): 3 mL via INTRAVENOUS

## 2017-03-31 MED ORDER — INSULIN ASPART 100 UNIT/ML ~~LOC~~ SOLN
0.0000 [IU] | Freq: Three times a day (TID) | SUBCUTANEOUS | Status: DC
  Administered 2017-04-01 (×2): 1 [IU] via SUBCUTANEOUS
  Administered 2017-04-02: 3 [IU] via SUBCUTANEOUS

## 2017-03-31 MED ORDER — NITROGLYCERIN 0.4 MG SL SUBL: 0.4000 mg | SUBLINGUAL_TABLET | SUBLINGUAL | Status: DC | PRN

## 2017-03-31 MED ORDER — IOPAMIDOL (ISOVUE-370) INJECTION 76%
INTRAVENOUS | Status: AC
  Filled 2017-03-31: qty 100

## 2017-03-31 MED ORDER — CLOPIDOGREL BISULFATE 75 MG PO TABS
75.0000 mg | ORAL_TABLET | Freq: Every day | ORAL | Status: DC
  Administered 2017-04-01 – 2017-04-02 (×2): 75 mg via ORAL
  Filled 2017-03-31 (×2): qty 1

## 2017-03-31 MED ORDER — ASPIRIN EC 81 MG PO TBEC
81.0000 mg | DELAYED_RELEASE_TABLET | Freq: Every day | ORAL | Status: DC
  Administered 2017-04-01: 81 mg via ORAL
  Filled 2017-03-31: qty 1

## 2017-03-31 MED ORDER — ASPIRIN 81 MG PO CHEW
324.0000 mg | CHEWABLE_TABLET | Freq: Once | ORAL | Status: AC
  Administered 2017-03-31: 324 mg via ORAL
  Filled 2017-03-31: qty 4

## 2017-03-31 MED ORDER — FUROSEMIDE 10 MG/ML IJ SOLN
40.0000 mg | Freq: Two times a day (BID) | INTRAMUSCULAR | Status: DC
  Administered 2017-04-01 – 2017-04-02 (×4): 40 mg via INTRAVENOUS
  Filled 2017-03-31 (×4): qty 4

## 2017-03-31 MED ORDER — INSULIN ASPART 100 UNIT/ML ~~LOC~~ SOLN: 0.0000 [IU] | Freq: Every day | SUBCUTANEOUS | Status: DC

## 2017-03-31 MED ORDER — ISOSORBIDE DINITRATE 20 MG PO TABS
30.0000 mg | ORAL_TABLET | Freq: Three times a day (TID) | ORAL | Status: DC
  Administered 2017-04-01 – 2017-04-02 (×6): 30 mg via ORAL
  Filled 2017-03-31 (×7): qty 1

## 2017-03-31 MED ORDER — NITROGLYCERIN 0.4 MG SL SUBL
0.4000 mg | SUBLINGUAL_TABLET | SUBLINGUAL | Status: AC | PRN
  Administered 2017-03-31 (×3): 0.4 mg via SUBLINGUAL
  Filled 2017-03-31: qty 1

## 2017-03-31 MED ORDER — CARVEDILOL 12.5 MG PO TABS
12.5000 mg | ORAL_TABLET | Freq: Two times a day (BID) | ORAL | Status: DC
  Administered 2017-04-01 – 2017-04-02 (×4): 12.5 mg via ORAL
  Filled 2017-03-31 (×4): qty 1

## 2017-03-31 MED ORDER — ALLOPURINOL 300 MG PO TABS
300.0000 mg | ORAL_TABLET | Freq: Every day | ORAL | Status: DC
  Administered 2017-04-01 – 2017-04-02 (×2): 300 mg via ORAL
  Filled 2017-03-31 (×2): qty 1

## 2017-03-31 MED ORDER — AMIODARONE HCL 200 MG PO TABS
200.0000 mg | ORAL_TABLET | Freq: Every day | ORAL | Status: DC
  Administered 2017-04-01 – 2017-04-02 (×2): 200 mg via ORAL
  Filled 2017-03-31 (×2): qty 1

## 2017-03-31 MED ORDER — NITROGLYCERIN 2 % TD OINT
1.0000 [in_us] | TOPICAL_OINTMENT | Freq: Once | TRANSDERMAL | Status: AC
  Administered 2017-03-31: 1 [in_us] via TOPICAL
  Filled 2017-03-31: qty 1

## 2017-03-31 MED ORDER — HYDRALAZINE HCL 50 MG PO TABS
50.0000 mg | ORAL_TABLET | Freq: Four times a day (QID) | ORAL | Status: DC
  Administered 2017-04-01 (×3): 50 mg via ORAL
  Filled 2017-03-31 (×3): qty 1

## 2017-03-31 MED ORDER — POTASSIUM CHLORIDE CRYS ER 20 MEQ PO TBCR
20.0000 meq | EXTENDED_RELEASE_TABLET | Freq: Two times a day (BID) | ORAL | Status: DC
  Administered 2017-04-01 – 2017-04-02 (×4): 20 meq via ORAL
  Filled 2017-03-31 (×4): qty 1

## 2017-03-31 MED ORDER — TAMSULOSIN HCL 0.4 MG PO CAPS
0.4000 mg | ORAL_CAPSULE | Freq: Every day | ORAL | Status: DC
  Administered 2017-04-01 – 2017-04-02 (×2): 0.4 mg via ORAL
  Filled 2017-03-31 (×2): qty 1

## 2017-03-31 MED ORDER — ATORVASTATIN CALCIUM 40 MG PO TABS
40.0000 mg | ORAL_TABLET | Freq: Every day | ORAL | Status: DC
  Administered 2017-04-01 (×2): 40 mg via ORAL
  Filled 2017-03-31 (×2): qty 1

## 2017-03-31 MED ORDER — APIXABAN 5 MG PO TABS
5.0000 mg | ORAL_TABLET | Freq: Two times a day (BID) | ORAL | Status: DC
  Administered 2017-04-01 – 2017-04-02 (×4): 5 mg via ORAL
  Filled 2017-03-31 (×4): qty 1

## 2017-03-31 NOTE — ED Provider Notes (Addendum)
[facility] DEPT Provider Note   CSN: [id number] Arrival date & time: 03/31/17  1347     History   Chief Complaint Chief Complaint  Patient presents with  . Shortness of Breath    HPI David Guerra is a 83 y.o. male.  The history is provided by the patient.  Shortness of Breath  This is a new problem. The average episode lasts 11 hours. The problem occurs continuously.The problem has been gradually worsening. Associated symptoms include headaches, cough, sputum production, hemoptysis, chest pain and abdominal pain. Pertinent negatives include no fever, no rhinorrhea, no orthopnea and no vomiting. He has tried nothing for the symptoms. Associated medical issues include heart failure, past MI and DVT (RLE supossed to be on Coumadin).  Chest Pain   This is a recurrent problem. The problem occurs constantly. The problem has been gradually worsening. The pain is associated with rest. The pain is present in the substernal region. The pain is moderate. The quality of the pain is described as heavy and dull. The pain does not radiate. Associated symptoms include abdominal pain, cough, headaches, hemoptysis, nausea, shortness of breath and sputum production. Pertinent negatives include no dizziness, no fever, no lower extremity edema, no orthopnea and no vomiting.  His past medical history is significant for DVT (RLE supossed to be on Coumadin) and MI.  Headache   This is a new problem. The current episode started 3 to 5 hours ago (gradual onset). The problem occurs constantly. The problem has been gradually worsening. The quality of the pain is described as dull. The pain is moderate. Associated symptoms include shortness of breath and nausea. Pertinent negatives include no fever, no orthopnea and no vomiting.   Was discharged yesterday from [facility] where he was receiving ppx dosing of lovenox.  During admission he had negative cardiac work up. No catheterization or stenting required as  myoscan was low risk.  Past Medical History:  Diagnosis Date  . Acute on chronic systolic and diastolic heart failure, NYHA class 4 ([place]) 02/21/2017  . AICD (automatic cardioverter/defibrillator) present    [employer]  . Anemia   . Cancer ([place])    mulitple myeloma  . Chronic systolic CHF (congestive heart failure), NYHA class 3 ([place])   . CKD (chronic kidney disease), stage III   . Coronary artery disease   . Diabetes mellitus without complication ([place])   . Hypertension   . Multiple myeloma ([place])    "the doctor says it's resting"  . Myocardial infarction ([place])   . Prostate cancer ([place])   . PUD (peptic ulcer disease)     Patient Active Problem List   Diagnosis Date Noted  . Normocytic anemia 03/27/2017  . Acute bronchitis 03/05/2017  . Chronic combined systolic and diastolic CHF (congestive heart failure) ([place]) 03/05/2017  . CKD (chronic kidney disease), stage III   . Chest pain 02/21/2017  . Acute on chronic systolic and diastolic heart failure, NYHA class 4 ([place]) 02/21/2017  . Essential hypertension 02/21/2017  . AICD (automatic cardioverter/defibrillator) present 02/21/2017  . Coronary artery disease due to lipid rich plaque 02/21/2017  . Type 2 diabetes mellitus with stage 3 chronic kidney disease, without long-term current use of insulin ([place]) 02/21/2017    Past Surgical History:  Procedure Laterality Date  . APPENDECTOMY    . BACK SURGERY  1999  . CARDIAC DEFIBRILLATOR PLACEMENT  11/2016   [employer]  . CARDIAC DEFIBRILLATOR PLACEMENT    . CORONARY ANGIOPLASTY WITH STENT PLACEMENT  11/2016  total of 2 stents, 1 in January, 1 in February, both in [facility] in [place], [place] Medications    Prior to Admission medications   Medication Sig Start Date End Date Taking? Authorizing Provider  allopurinol (ZYLOPRIM) 300 MG tablet Take 300 mg by mouth daily. For gout 01/16/17  Yes [provider]   amiodarone (PACERONE) 200 MG tablet Take 1 tablet (200 mg total) by mouth daily. 02/25/17  Yes Reyne Dumas, MD  aspirin EC 81 MG tablet Take 81 mg by mouth daily. For heart   Yes [provider]  atorvastatin (LIPITOR) 40 MG tablet Take 40 mg by mouth at bedtime. For lowering cholesterol 01/16/17  Yes [provider]  carvedilol (COREG) 12.5 MG tablet Take 12.5 mg by mouth every 12 (twelve) hours. For heart 01/16/17  Yes [provider]  clopidogrel (PLAVIX) 75 MG tablet Take 75 mg by mouth daily. For blood thinning 01/29/17  Yes [provider]  ELIQUIS 5 MG TABS tablet Take 5 mg by mouth 2 (two) times daily. 03/18/17  Yes [provider]  fluticasone (FLONASE) 50 MCG/ACT nasal spray Place 1 spray into both nostrils at bedtime. 01/29/17  Yes [provider]  furosemide (LASIX) 20 MG tablet Take 2 tablets (40 mg total) by mouth 2 (two) times daily. 03/30/17  Yes Hongalgi, Lenis Dickinson, MD  glipiZIDE (GLUCOTROL) 5 MG tablet Take 2.5 mg by mouth daily. 03/25/17  Yes [provider]  hydrALAZINE (APRESOLINE) 50 MG tablet Take 1 tablet (50 mg total) by mouth every 6 (six) hours. 03/30/17 04/29/17 Yes Hongalgi, Lenis Dickinson, MD  isosorbide dinitrate (ISORDIL) 30 MG tablet Take 1 tablet (30 mg total) by mouth 3 (three) times daily. 03/30/17 04/29/17 Yes Hongalgi, Lenis Dickinson, MD  latanoprost (XALATAN) 0.005 % ophthalmic solution Place 1 drop into both eyes at bedtime.   Yes [provider]  nitroGLYCERIN (NITROSTAT) 0.4 MG SL tablet Place 1 tablet (0.4 mg total) under the tongue every 5 (five) minutes as needed for chest pain. Up to 3 doses per episode of chest pain. 03/30/17  Yes Hongalgi, Lenis Dickinson, MD  potassium chloride SA (K-DUR,KLOR-CON) 20 MEQ tablet Take 20 mEq by mouth 2 (two) times daily.   Yes [provider]  tamsulosin (FLOMAX) 0.4 MG CAPS capsule Take 0.4 mg by mouth daily. 01/16/17  Yes [provider]  vitamin B-12 (CYANOCOBALAMIN) 1000  MCG tablet Take 1,000 mcg by mouth daily.   Yes [provider]    Family History Family History  Problem Relation Age of Onset  . Dementia Mother   . Cancer Father   . Prostate cancer Brother   . Prostate cancer Brother     Social History Social History  Substance Use Topics  . Smoking status: Never Smoker  . Smokeless tobacco: Never Used  . Alcohol use Yes     Comment: occasionally     Allergies   Patient has no known allergies.   Review of Systems Review of Systems  Constitutional: Negative for fever.  HENT: Negative for rhinorrhea.   Respiratory: Positive for cough, hemoptysis, sputum production and shortness of breath.   Cardiovascular: Positive for chest pain. Negative for orthopnea.  Gastrointestinal: Positive for abdominal pain and nausea. Negative for vomiting.  Neurological: Positive for headaches. Negative for dizziness.  All other systems are reviewed and are negative for acute change except as noted in the HPI  Physical Exam Updated Vital Signs BP (!) 190/79 (BP Location: Right Arm)   Pulse 60   Temp 98.2 F (36.8 C) (Oral)   Resp (!) 22   Ht 5' 8"  (1.727 m)   Wt 189 lb 9.5 oz (86 kg)   SpO2 97%   BMI 28.83 kg/m   Physical Exam  Constitutional: He is oriented to person, place, and time. He appears well-developed and well-nourished. No distress.  HENT:  Head: Normocephalic and atraumatic.  Nose: Nose normal.  Eyes: Conjunctivae and EOM are normal. Pupils are equal, round, and reactive to light. Right eye exhibits no discharge. Left eye exhibits no discharge. No scleral icterus.  Neck: Normal range of motion. Neck supple.  Cardiovascular: Normal rate and regular rhythm.  Exam reveals no gallop and no friction rub.   No murmur heard. Pulmonary/Chest: Effort normal and breath sounds normal. No stridor. No respiratory distress. He has no rales.  Abdominal: Soft. He exhibits no distension. There is no tenderness.  Musculoskeletal: He  exhibits no edema or tenderness.  Neurological: He is alert and oriented to person, place, and time.  Skin: Skin is warm and dry. No rash noted. He is not diaphoretic. No erythema.  Psychiatric: He has a normal mood and affect.  Vitals reviewed.    ED Treatments / Results  Labs (all labs ordered are listed, but only abnormal results are displayed) Labs Reviewed  CBC - Abnormal; Notable for the following:       Result Value   RBC 3.26 (*)    Hemoglobin 8.8 (*)    HCT 27.0 (*)    RDW 17.9 (*)    All other components within normal limits  COMPREHENSIVE METABOLIC PANEL - Abnormal; Notable for the following:    Glucose, Bld 112 (*)    BUN 24 (*)    Creatinine, Ser 1.55 (*)    Calcium 8.4 (*)    Albumin 3.3 (*)    GFR calc non Af Amer 40 (*)    GFR calc Af Amer 46 (*)    All other components within normal limits  BRAIN NATRIURETIC PEPTIDE - Abnormal; Notable for the following:    B Natriuretic Peptide 1,101.3 (*)    All other components within normal limits  TROPONIN I - Abnormal; Notable for the following:    Troponin I 0.03 (*)    All other components within normal limits  LIPASE, BLOOD  BASIC METABOLIC PANEL  I-STAT TROPOININ, ED    EKG  EKG Interpretation  Date/Time:  Tuesday Mar 31 2017 14:11:19 EDT Ventricular Rate:  60 PR Interval:    QRS Duration: 115 QT Interval:  551 QTC Calculation: 551 R Axis:   -115 Text Interpretation:  Atrial-paced complexes Prolonged PR interval Nonspecific IVCD with LAD Borderline low voltage, extremity leads new STD in V5 and V6 when compared to 03/27/17 No STEMI  Confirmed by [facility] MD, Lucia Mccreadie ([id number]) on 03/31/2017 3:32:43 PM       Radiology Dg Chest 2 View  Result Date: 03/31/2017 CLINICAL DATA:  Shortness of breath and chest pain EXAM: CHEST  2 VIEW COMPARISON:  Chest radiograph 03/26/2017 FINDINGS: Left chest wall AICD leads are in unchanged position. Cardiomegaly is unchanged. No focal airspace consolidation or pulmonary edema.  There is pulmonary vascular congestion. No pleural effusion or pneumothorax. IMPRESSION: Pulmonary vascular congestion without overt pulmonary edema. Unchanged cardiomegaly. Electronically Signed   By: Ulyses Jarred M.D.   On: 03/31/2017 15:54   Ct Angio Chest Pe W Or Wo  Contrast  Result Date: 03/31/2017 CLINICAL DATA:  Chest pain and history of congestive heart failure. Shortness of breath. EXAM: CT ANGIOGRAPHY CHEST WITH CONTRAST TECHNIQUE: Multidetector CT imaging of the chest was performed using the standard protocol during bolus administration of intravenous contrast. Multiplanar CT image reconstructions and MIPs were obtained to evaluate the vascular anatomy. CONTRAST:  80 mL Isovue 370 IV COMPARISON:  Chest radiograph 03/31/2017 FINDINGS: Cardiovascular: Contrast injection is sufficient to demonstrate satisfactory opacification of the pulmonary arteries to the segmental level. There is no pulmonary embolus. The main pulmonary artery is mildly enlarged, measuring 3.3 cm at the bifurcation. There is no CT evidence of acute right heart strain. There is mild atherosclerotic calcification of the aorta. There is a normal variant aortic arch branching pattern with the brachiocephalic and left common carotid arteries sharing a common origin. Heart size is enlarged, with a small pericardial effusion measuring 6 mm in thickness. There are AICD leads and coronary artery stents. Mediastinum/Nodes: No mediastinal, hilar or axillary lymphadenopathy. The visualized thyroid and thoracic esophageal course are unremarkable. Lungs/Pleura: There are large right and small left pleural effusions. There are multifocal ground-glass opacities throughout both lungs in a pattern most suggestive of pulmonary edema. There is bibasilar atelectasis. Upper Abdomen: Contrast bolus timing is not optimized for evaluation of the abdominal organs. There are multiple hypoattenuating lesions scattered throughout the liver, measuring up to 1.7  cm. The other visualized portions of the upper abdominal organs are normal. Musculoskeletal: There is diffuse osteopenia with a trabeculated appearance of all of the vertebral bodies. Review of the MIP images confirms the above findings. IMPRESSION: 1. No pulmonary embolus. 2. Cardiomegaly, small pericardial effusion, bilateral pleural effusions and multifocal ground-glass opacities, most consistent with congestive heart failure. 3. Mildly enlarged main pulmonary artery, which may be seen in the setting of pulmonary hypertension. 4. Diffuse osteopenia, consistent with multiple myeloma, suggesting diffuse marrow involvement. 5. Aortic atherosclerosis. Electronically Signed   By: Ulyses Jarred M.D.   On: 03/31/2017 17:55    Procedures Procedures (including critical care time)  Medications Ordered in ED Medications  iopamidol (ISOVUE-370) 76 % injection (not administered)  apixaban (ELIQUIS) tablet 5 mg (not administered)  hydrALAZINE (APRESOLINE) tablet 50 mg (not administered)  isosorbide dinitrate (ISORDIL) tablet 30 mg (not administered)  nitroGLYCERIN (NITROSTAT) SL tablet 0.4 mg (not administered)  allopurinol (ZYLOPRIM) tablet 300 mg (not administered)  amiodarone (PACERONE) tablet 200 mg (not administered)  aspirin EC tablet 81 mg (not administered)  atorvastatin (LIPITOR) tablet 40 mg (not administered)  carvedilol (COREG) tablet 12.5 mg (not administered)  clopidogrel (PLAVIX) tablet 75 mg (not administered)  potassium chloride SA (K-DUR,KLOR-CON) CR tablet 20 mEq (not administered)  tamsulosin (FLOMAX) capsule 0.4 mg (not administered)  pantoprazole (PROTONIX) EC tablet 40 mg (not administered)  insulin aspart (novoLOG) injection 0-9 Units (not administered)  insulin aspart (novoLOG) injection 0-5 Units (not administered)  sodium chloride flush (NS) 0.9 % injection 3 mL (not administered)  furosemide (LASIX) injection 40 mg (not administered)  aspirin chewable tablet 324 mg (324 mg  Oral Given 03/31/17 1632)  nitroGLYCERIN (NITROGLYN) 2 % ointment 1 inch (1 inch Topical Given 03/31/17 1638)  nitroGLYCERIN (NITROSTAT) SL tablet 0.4 mg (0.4 mg Sublingual Given 03/31/17 1753)  iopamidol (ISOVUE-370) 76 % injection 100 mL (80 mLs Intravenous Contrast Given 03/31/17 1718)     Initial Impression / Assessment and Plan / ED Course  I have reviewed the triage vital signs and the nursing notes.  Pertinent labs & imaging results  that were available during my care of the patient were reviewed by me and considered in my medical decision making (see chart for details).     Atypical chest pain improved with nitroglycerin. EKG new ST depressions in lateral leads.  Initial troponin negative. Given the patient's history of prior MI status post stenting patient is high risk for ACS however he had a recent Myoview scan which showed low risk disease.  Given his history of right lower leg DVT, CTA was obtained which showed no evidence of pulmonary embolism. It did reveal small pericardial effusion and bilateral pleural effusions with multifocal ground glass opacity consistent with congestive heart failure. BNP elevated at 1100. Patient does not have any peripheral edema and is satting well on room air.  Presentation was not classic for aortic dissection or esophageal perforation.  Discussed case with hospitalist who will admit patient for further workup and management.  Final Clinical Impressions(s) / ED Diagnoses   Final diagnoses:  Chest pain  Acute on chronic systolic congestive heart failure (HCC)        Liann Spaeth, Grayce Sessions, MD 04/01/17 0008

## 2017-03-31 NOTE — ED Notes (Signed)
CHEST PRESSURE  NOW PAIN 1/10HAS IMPROVED HOWEVER STOMACH PAIN 3/10  HAS IMPROVED SINCE NTG SL GIVEN

## 2017-03-31 NOTE — ED Triage Notes (Signed)
Patient reports that he was discharged from the hospital yesterday s/p CHF. Patient states he woke at 0400 today with SOB and feeling like someone pounding on his chest.

## 2017-03-31 NOTE — ED Notes (Signed)
DELAY WITH BNP LAB- CALLED SPOKE WITH SUSAN FROM LAB. WILL PROCESS NOW. EDP PEDRO MADE AWARE.

## 2017-03-31 NOTE — ED Notes (Signed)
Patient transported to X-ray 

## 2017-04-01 DIAGNOSIS — I5041 Acute combined systolic (congestive) and diastolic (congestive) heart failure: Secondary | ICD-10-CM

## 2017-04-01 DIAGNOSIS — Z955 Presence of coronary angioplasty implant and graft: Secondary | ICD-10-CM | POA: Diagnosis not present

## 2017-04-01 DIAGNOSIS — C9000 Multiple myeloma not having achieved remission: Secondary | ICD-10-CM | POA: Diagnosis present

## 2017-04-01 DIAGNOSIS — I5023 Acute on chronic systolic (congestive) heart failure: Secondary | ICD-10-CM | POA: Diagnosis present

## 2017-04-01 DIAGNOSIS — D649 Anemia, unspecified: Secondary | ICD-10-CM

## 2017-04-01 DIAGNOSIS — Z95810 Presence of automatic (implantable) cardiac defibrillator: Secondary | ICD-10-CM

## 2017-04-01 DIAGNOSIS — I10 Essential (primary) hypertension: Secondary | ICD-10-CM | POA: Diagnosis not present

## 2017-04-01 DIAGNOSIS — I480 Paroxysmal atrial fibrillation: Secondary | ICD-10-CM | POA: Diagnosis present

## 2017-04-01 DIAGNOSIS — M109 Gout, unspecified: Secondary | ICD-10-CM | POA: Diagnosis present

## 2017-04-01 DIAGNOSIS — R079 Chest pain, unspecified: Secondary | ICD-10-CM | POA: Diagnosis not present

## 2017-04-01 DIAGNOSIS — Z7982 Long term (current) use of aspirin: Secondary | ICD-10-CM | POA: Diagnosis not present

## 2017-04-01 DIAGNOSIS — I255 Ischemic cardiomyopathy: Secondary | ICD-10-CM | POA: Diagnosis present

## 2017-04-01 DIAGNOSIS — Z8042 Family history of malignant neoplasm of prostate: Secondary | ICD-10-CM | POA: Diagnosis not present

## 2017-04-01 DIAGNOSIS — I5043 Acute on chronic combined systolic (congestive) and diastolic (congestive) heart failure: Secondary | ICD-10-CM

## 2017-04-01 DIAGNOSIS — E1122 Type 2 diabetes mellitus with diabetic chronic kidney disease: Secondary | ICD-10-CM | POA: Diagnosis not present

## 2017-04-01 DIAGNOSIS — E876 Hypokalemia: Secondary | ICD-10-CM | POA: Diagnosis present

## 2017-04-01 DIAGNOSIS — I2510 Atherosclerotic heart disease of native coronary artery without angina pectoris: Secondary | ICD-10-CM | POA: Diagnosis not present

## 2017-04-01 DIAGNOSIS — Z86718 Personal history of other venous thrombosis and embolism: Secondary | ICD-10-CM | POA: Diagnosis not present

## 2017-04-01 DIAGNOSIS — N183 Chronic kidney disease, stage 3 (moderate): Secondary | ICD-10-CM | POA: Diagnosis not present

## 2017-04-01 DIAGNOSIS — Z9861 Coronary angioplasty status: Secondary | ICD-10-CM | POA: Diagnosis not present

## 2017-04-01 DIAGNOSIS — Z8711 Personal history of peptic ulcer disease: Secondary | ICD-10-CM | POA: Diagnosis not present

## 2017-04-01 DIAGNOSIS — I252 Old myocardial infarction: Secondary | ICD-10-CM | POA: Diagnosis not present

## 2017-04-01 DIAGNOSIS — Z8546 Personal history of malignant neoplasm of prostate: Secondary | ICD-10-CM | POA: Diagnosis not present

## 2017-04-01 DIAGNOSIS — N400 Enlarged prostate without lower urinary tract symptoms: Secondary | ICD-10-CM | POA: Diagnosis present

## 2017-04-01 DIAGNOSIS — D631 Anemia in chronic kidney disease: Secondary | ICD-10-CM | POA: Diagnosis present

## 2017-04-01 DIAGNOSIS — I110 Hypertensive heart disease with heart failure: Secondary | ICD-10-CM | POA: Diagnosis not present

## 2017-04-01 DIAGNOSIS — R042 Hemoptysis: Secondary | ICD-10-CM | POA: Diagnosis not present

## 2017-04-01 DIAGNOSIS — R0602 Shortness of breath: Secondary | ICD-10-CM | POA: Diagnosis present

## 2017-04-01 DIAGNOSIS — Z7901 Long term (current) use of anticoagulants: Secondary | ICD-10-CM | POA: Diagnosis not present

## 2017-04-01 DIAGNOSIS — J449 Chronic obstructive pulmonary disease, unspecified: Secondary | ICD-10-CM | POA: Diagnosis present

## 2017-04-01 DIAGNOSIS — I130 Hypertensive heart and chronic kidney disease with heart failure and stage 1 through stage 4 chronic kidney disease, or unspecified chronic kidney disease: Secondary | ICD-10-CM | POA: Diagnosis present

## 2017-04-01 DIAGNOSIS — I2583 Coronary atherosclerosis due to lipid rich plaque: Secondary | ICD-10-CM | POA: Diagnosis present

## 2017-04-01 LAB — BASIC METABOLIC PANEL
ANION GAP: 8 (ref 5–15)
BUN: 19 mg/dL (ref 6–20)
CHLORIDE: 109 mmol/L (ref 101–111)
CO2: 26 mmol/L (ref 22–32)
Calcium: 8.4 mg/dL — ABNORMAL LOW (ref 8.9–10.3)
Creatinine, Ser: 1.44 mg/dL — ABNORMAL HIGH (ref 0.61–1.24)
GFR calc non Af Amer: 44 mL/min — ABNORMAL LOW (ref >60)
GFR, EST AFRICAN AMERICAN: 51 mL/min — AB (ref >60)
Glucose, Bld: 97 mg/dL (ref 65–99)
Potassium: 3.1 mmol/L — ABNORMAL LOW (ref 3.5–5.1)
Sodium: 143 mmol/L (ref 135–145)

## 2017-04-01 LAB — CBC WITH DIFFERENTIAL/PLATELET
BASOS ABS: 0 10*3/uL (ref 0.0–0.1)
BASOS PCT: 0 %
EOS PCT: 4 %
Eosinophils Absolute: 0.2 10*3/uL (ref 0.0–0.7)
HEMATOCRIT: 27.4 % — AB (ref 39.0–52.0)
Hemoglobin: 8.6 g/dL — ABNORMAL LOW (ref 13.0–17.0)
Lymphocytes Relative: 19 %
Lymphs Abs: 0.9 10*3/uL (ref 0.7–4.0)
MCH: 25.9 pg — ABNORMAL LOW (ref 26.0–34.0)
MCHC: 31.4 g/dL (ref 30.0–36.0)
MCV: 82.5 fL (ref 78.0–100.0)
MONO ABS: 0.4 10*3/uL (ref 0.1–1.0)
MONOS PCT: 9 %
NEUTROS ABS: 3.3 10*3/uL (ref 1.7–7.7)
Neutrophils Relative %: 68 %
PLATELETS: 149 10*3/uL — AB (ref 150–400)
RBC: 3.32 MIL/uL — ABNORMAL LOW (ref 4.22–5.81)
RDW: 17.8 % — AB (ref 11.5–15.5)
WBC: 4.8 10*3/uL (ref 4.0–10.5)

## 2017-04-01 LAB — GLUCOSE, CAPILLARY
GLUCOSE-CAPILLARY: 94 mg/dL (ref 65–99)
GLUCOSE-CAPILLARY: 138 mg/dL — AB (ref 65–99)
Glucose-Capillary: 145 mg/dL — ABNORMAL HIGH (ref 65–99)
Glucose-Capillary: 83 mg/dL (ref 65–99)
Glucose-Capillary: 93 mg/dL (ref 65–99)

## 2017-04-01 LAB — TROPONIN I: TROPONIN I: 0.03 ng/mL — AB (ref <0.03)

## 2017-04-01 MED ORDER — ENSURE ENLIVE PO LIQD
237.0000 mL | Freq: Two times a day (BID) | ORAL | Status: DC
  Administered 2017-04-01 (×2): 237 mL via ORAL

## 2017-04-01 MED ORDER — HYDRALAZINE HCL 25 MG PO TABS
75.0000 mg | ORAL_TABLET | Freq: Four times a day (QID) | ORAL | Status: DC
  Administered 2017-04-01 – 2017-04-02 (×5): 75 mg via ORAL
  Filled 2017-04-01 (×5): qty 1

## 2017-04-01 NOTE — H&P (Signed)
History and Physical  Patient Name: David Guerra     WYO:[id number]    DOB: 12/30/1932    DOA: 03/31/2017 PCP: System, Pcp Not In   Patient coming from: Daughter's home  Chief Complaint: Dyspnea, chest discomfort  HPI: David Guerra is a 83 y.o. male with a past medical history significant for multiple myeloma, CAD s/p PCI last Dec and VF arrest with ICD, CHD EF 35%, COPD, IDDM, and recent DVT who presents with chest discomfort again.  The patient was just admitted from 5/3 until yesterday for chest pain.  Seen by Cardiology, had echo that showed EF 35%, had Myoview that showed a single fixed defect.  He was diuresed with IV lasix initially, but it appears his creatinine increased from 1.8 to 2.2 and so this was stopped and his Lasix at discharge was changed from 60 BID to 40 BID.  Seems he had stabilized and felt ready for d/c by yesterday.  He got home yesterday evening at [facility] and took his evening meds and went to bed.  Then this morning at 4AM he woke with shortness of breath, feeling of pressure or squeezing on his chest.  He got up and took a shower and maybe the pain got better.  He had no nitro to take.  The feeling of dyspnea persisted all day, and by the afternoon his family convinced him to come to the ER.  There was no diaphoresis, no obvious orthopnea or positional change.  No leg swelling, cough.  Granddaughter thinks he told her he had hemoptysis.  ED course: -Afebrile, heart rate 60, respirations and pulse is normal, blood pressure 180/80  -Na 140, K 3.7, Cr 1.55 (baseline 1.8), WBC 6.7K, Hgb 8.8 -BNP 1101 -Lipase normal -Troponin negative -Chest x-ray showed edema -CTA showed no PE, showed patchy groundglass opacity -He was given aspirin 325 and nitroglycerin helped his pain briefly until his BP rose again and his pain returned and TRH were asked to evaluate for chest pain and CHF flare   The patient is retired and lives in [place]. It sounds as if he was in relatively good  health until last December when he had a heart attack leading to VF arrest, leading to AKI requiring brief IHD, treated with two stents, had an ICD and pacer placed.   Since his heart attack, he was previously able to walk around without difficulty, but now has dyspnea just walking across the room or tying his shoes.  Since then, he has come down to [place] to be near his daughter, but since coming here he has been admitted here three times already (once for CHF, once bronchitis and once for chest pain) and once to [facility] (for new DVT for which he is on Eliquis).      ROS: Review of Systems  Constitutional: Negative for chills and fever.  Respiratory: Positive for hemoptysis and shortness of breath. Negative for cough and wheezing.   Cardiovascular: Positive for chest pain and orthopnea. Negative for leg swelling and PND.  All other systems reviewed and are negative.         Past Medical History:  Diagnosis Date  . Acute on chronic systolic and diastolic heart failure, NYHA class 4 ([place]) 02/21/2017  . AICD (automatic cardioverter/defibrillator) present    [employer]  . Anemia   . Cancer ([place])    mulitple myeloma  . Chronic systolic CHF (congestive heart failure), NYHA class 3 ([place])   . CKD (chronic kidney disease), stage III   .  Coronary artery disease   . Diabetes mellitus without complication ([place])   . Hypertension   . Multiple myeloma ([place])    "the doctor says it's resting"  . Myocardial infarction ([place])   . Prostate cancer ([place])   . PUD (peptic ulcer disease)     Past Surgical History:  Procedure Laterality Date  . APPENDECTOMY    . BACK SURGERY  1999  . CARDIAC DEFIBRILLATOR PLACEMENT  11/2016   [employer]  . CARDIAC DEFIBRILLATOR PLACEMENT    . CORONARY ANGIOPLASTY WITH STENT PLACEMENT  11/2016   total of 2 stents, 1 in January, 1 in February, both in [facility] in [place], [place]    Social History: Patient lives  normally alone in [place].  The patient walks unassisted.  Lately he has been staying with his daughter here in [place]. He does not smoke.  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    No Known Allergies  Family history: family history includes Cancer in his father; Dementia in his mother; Prostate cancer in his brother and brother.  Prior to Admission medications   Medication Sig Start Date End Date Taking? Authorizing Provider  allopurinol (ZYLOPRIM) 300 MG tablet Take 300 mg by mouth daily. For gout 01/16/17  Yes [provider]  amiodarone (PACERONE) 200 MG tablet Take 1 tablet (200 mg total) by mouth daily. 02/25/17  Yes Reyne Dumas, MD  aspirin EC 81 MG tablet Take 81 mg by mouth daily. For heart   Yes [provider]  atorvastatin (LIPITOR) 40 MG tablet Take 40 mg by mouth at bedtime. For lowering cholesterol 01/16/17  Yes [provider]  carvedilol (COREG) 12.5 MG tablet Take 12.5 mg by mouth every 12 (twelve) hours. For heart 01/16/17  Yes [provider]  clopidogrel (PLAVIX) 75 MG tablet Take 75 mg by mouth daily. For blood thinning 01/29/17  Yes [provider]  ELIQUIS 5 MG TABS tablet Take 5 mg by mouth 2 (two) times daily. 03/18/17  Yes [provider]  fluticasone (FLONASE) 50 MCG/ACT nasal spray Place 1 spray into both nostrils at bedtime. 01/29/17  Yes [provider]  furosemide (LASIX) 20 MG tablet Take 2 tablets (40 mg total) by mouth 2 (two) times daily. 03/30/17  Yes Hongalgi, Lenis Dickinson, MD  glipiZIDE (GLUCOTROL) 5 MG tablet Take 2.5 mg by mouth daily. 03/25/17  Yes [provider]  hydrALAZINE (APRESOLINE) 50 MG tablet Take 1 tablet (50 mg total) by mouth every 6 (six) hours. 03/30/17 04/29/17 Yes Hongalgi, Lenis Dickinson, MD  isosorbide dinitrate (ISORDIL) 30 MG tablet Take 1 tablet (30 mg total) by mouth 3 (three) times daily. 03/30/17 04/29/17 Yes Hongalgi, Lenis Dickinson, MD  latanoprost (XALATAN) 0.005 % ophthalmic  solution Place 1 drop into both eyes at bedtime.   Yes [provider]  nitroGLYCERIN (NITROSTAT) 0.4 MG SL tablet Place 1 tablet (0.4 mg total) under the tongue every 5 (five) minutes as needed for chest pain. Up to 3 doses per episode of chest pain. 03/30/17  Yes Hongalgi, Lenis Dickinson, MD  potassium chloride SA (K-DUR,KLOR-CON) 20 MEQ tablet Take 20 mEq by mouth 2 (two) times daily.   Yes [provider]  tamsulosin (FLOMAX) 0.4 MG CAPS capsule Take 0.4 mg by mouth daily. 01/16/17  Yes [provider]  vitamin B-12 (CYANOCOBALAMIN) 1000 MCG tablet Take 1,000 mcg by mouth daily.   Yes [provider]  Physical Exam: BP (!) 190/79 (BP Location: Right Arm)   Pulse 60   Temp 98.2 F (36.8 C) (Oral)   Resp (!) 22   Ht 5' 8"  (1.727 m)   Wt 86 kg (189 lb 9.5 oz)   SpO2 97%   BMI 28.83 kg/m  General appearance: Well-developed, adult male, alert and in no acute distress.   Eyes: Anicteric, conjunctiva pink, lids and lashes normal. PERRL.    ENT: No nasal deformity, discharge, epistaxis.  Hearing normal. OP moist without lesions.   Neck: No neck masses.  Trachea midline.  No thyromegaly/tenderness. Lymph: No cervical or supraclavicular lymphadenopathy. Skin: Warm and dry.   No suspicious rashes or lesions. Cardiac: RRR, nl S1-S2, no murmurs appreciated.  Capillary refill is brisk.  JVP elevated.  No LE edema.  Radial and DP pulses 2+ and symmetric. Respiratory: Normal respiratory rate and rhythm.  No wheezes.  Crackles at very bases bilaterally. Abdomen: Abdomen soft.  No TTP. No ascites, distension, hepatosplenomegaly.   MSK: No deformities or effusions.  No cyanosis or clubbing. Neuro: Cranial nerves normal.  Sensation intact to light touch. Speech is fluent.  Muscle strength normal.    Psych: Sensorium intact and responding to questions, attention normal.  Behavior appropriate.  Affect normal.  Judgment and insight appear normal.     Labs on  Admission:  I have personally reviewed following labs and imaging studies: CBC:  Recent Labs Lab 03/26/17 1735 03/27/17 1026 03/28/17 0448 03/30/17 0419 03/31/17 1554  WBC 5.2 4.9 5.4 4.9 6.7  HGB 8.3* 8.5* 8.8* 8.1* 8.8*  HCT 25.8* 25.9* 27.5* 25.4* 27.0*  MCV 84.9 84.4 84.4 81.9 82.8  PLT 153 134* 147* 149* 144   Basic Metabolic Panel:  Recent Labs Lab 03/27/17 1026 03/28/17 0448 03/29/17 0235 03/30/17 0419 03/31/17 1554  NA 142 141 141 139 140  K 3.4* 3.7 3.5 3.4* 3.7  CL 107 106 106 107 110  CO2 27 26 27 26 25   GLUCOSE 87 87 92 92 112*  BUN 22* 21* 24* 20 24*  CREATININE 1.70* 1.89* 2.16* 1.83* 1.55*  CALCIUM 8.2* 8.2* 8.4* 8.0* 8.4*   GFR: Estimated Creatinine Clearance: 39.2 mL/min (A) (by C-G formula based on SCr of 1.55 mg/dL (H)).  Liver Function Tests:  Recent Labs Lab 03/31/17 1554  AST 27  ALT 52  ALKPHOS 55  BILITOT 0.4  PROT 6.5  ALBUMIN 3.3*    Recent Labs Lab 03/31/17 1554  LIPASE 13   No results for input(s): AMMONIA in the last 168 hours. Coagulation Profile: No results for input(s): INR, PROTIME in the last 168 hours. Cardiac Enzymes:  Recent Labs Lab 03/26/17 2216 03/27/17 0327 03/27/17 1026 03/31/17 1952  TROPONINI 0.03* 0.03* <0.03 0.03*   BNP (last 3 results) No results for input(s): PROBNP in the last 8760 hours. HbA1C: No results for input(s): HGBA1C in the last 72 hours. CBG:  Recent Labs Lab 03/29/17 1653 03/29/17 2008 03/30/17 0728 03/30/17 1119 04/01/17 0046  GLUCAP 116* 103* 86 172* 93   Lipid Profile: No results for input(s): CHOL, HDL, LDLCALC, TRIG, CHOLHDL, LDLDIRECT in the last 72 hours. Thyroid Function Tests: No results for input(s): TSH, T4TOTAL, FREET4, T3FREE, THYROIDAB in the last 72 hours. Anemia Panel: No results for input(s): VITAMINB12, FOLATE, FERRITIN, TIBC, IRON, RETICCTPCT in the last 72 hours. Sepsis Labs: Invalid input(s): PROCALCITONIN, LACTICIDVEN No results found for this  or any previous visit (from the past 240 hour(s)).       Radiological  Exams on Admission: Personally reviewed CXR shows bilateral edema; CTA report reviewed: Dg Chest 2 View  Result Date: 03/31/2017 CLINICAL DATA:  Shortness of breath and chest pain EXAM: CHEST  2 VIEW COMPARISON:  Chest radiograph 03/26/2017 FINDINGS: Left chest wall AICD leads are in unchanged position. Cardiomegaly is unchanged. No focal airspace consolidation or pulmonary edema. There is pulmonary vascular congestion. No pleural effusion or pneumothorax. IMPRESSION: Pulmonary vascular congestion without overt pulmonary edema. Unchanged cardiomegaly. Electronically Signed   By: Ulyses Jarred M.D.   On: 03/31/2017 15:54   Ct Angio Chest Pe W Or Wo Contrast  Result Date: 03/31/2017 CLINICAL DATA:  Chest pain and history of congestive heart failure. Shortness of breath. EXAM: CT ANGIOGRAPHY CHEST WITH CONTRAST TECHNIQUE: Multidetector CT imaging of the chest was performed using the standard protocol during bolus administration of intravenous contrast. Multiplanar CT image reconstructions and MIPs were obtained to evaluate the vascular anatomy. CONTRAST:  80 mL Isovue 370 IV COMPARISON:  Chest radiograph 03/31/2017 FINDINGS: Cardiovascular: Contrast injection is sufficient to demonstrate satisfactory opacification of the pulmonary arteries to the segmental level. There is no pulmonary embolus. The main pulmonary artery is mildly enlarged, measuring 3.3 cm at the bifurcation. There is no CT evidence of acute right heart strain. There is mild atherosclerotic calcification of the aorta. There is a normal variant aortic arch branching pattern with the brachiocephalic and left common carotid arteries sharing a common origin. Heart size is enlarged, with a small pericardial effusion measuring 6 mm in thickness. There are AICD leads and coronary artery stents. Mediastinum/Nodes: No mediastinal, hilar or axillary lymphadenopathy. The visualized  thyroid and thoracic esophageal course are unremarkable. Lungs/Pleura: There are large right and small left pleural effusions. There are multifocal ground-glass opacities throughout both lungs in a pattern most suggestive of pulmonary edema. There is bibasilar atelectasis. Upper Abdomen: Contrast bolus timing is not optimized for evaluation of the abdominal organs. There are multiple hypoattenuating lesions scattered throughout the liver, measuring up to 1.7 cm. The other visualized portions of the upper abdominal organs are normal. Musculoskeletal: There is diffuse osteopenia with a trabeculated appearance of all of the vertebral bodies. Review of the MIP images confirms the above findings. IMPRESSION: 1. No pulmonary embolus. 2. Cardiomegaly, small pericardial effusion, bilateral pleural effusions and multifocal ground-glass opacities, most consistent with congestive heart failure. 3. Mildly enlarged main pulmonary artery, which may be seen in the setting of pulmonary hypertension. 4. Diffuse osteopenia, consistent with multiple myeloma, suggesting diffuse marrow involvement. 5. Aortic atherosclerosis. Electronically Signed   By: Ulyses Jarred M.D.   On: 03/31/2017 17:55    EKG: Independently reviewed. Rate 60, paced.   Echocardiogram April 2018: Report reviewed No valvular disease EF 35-40% PAP 53        Assessment/Plan  1. Acute on chronic systolic CHF:  Suspect his pain and dyspnea are from CHF (appears volume overloaded on exam, CT chest, and based on BNP) and exacerbated by HTN.  Diuresis limited during last hospitalization by worsening creatinine.   Dry weight in Apr ~192 lbs at discharge -Furosemide IV -Consult to Cardiology, appreciate cares -Control BP -Strict I/Os -Daily weights, daily BMP    2. Type 2 diabetes:  -Hold glipizide -SSI with meals  3. Hypertension and Coronary artery disease s/p VF arrest with ICD:  Recent stents in Dec. In [place] -Continue hydralazine,  Isordil, BB -Continue aspirin, statin -Continue Plavix, PPI -Continue amiodarone  4. Recent DVT:  -Continue Eliquis  5. Other medications:  -Continue Flomax -  Continue allopurinol  6. CKD stage III: Baseline Cr 1.8  7. Normocytic anemia: Baseline 9-10, near baseline        DVT prophylaxis: N/A  Code Status: FULL  Family Communication: Daughter and granddaughter at bedside  Disposition Plan: Anticipate IV diuresis overnight, check BMP in AM.  If breathing better and renal function stable, possibly home tomorrow. Consults called: Cardiology via Inbasket Admission status: OBS At the point of initial evaluation, it is my clinical opinion that admission for OBSERVATION is reasonable and necessary because the patient's presenting complaints in the context of their chronic conditions represent sufficient risk of deterioration or significant morbidity to constitute reasonable grounds for close observation in the hospital setting, but that the patient may be medically stable for discharge from the hospital within 24 to 48 hours.    Medical decision making: Patient seen at 8:40 PM on 03/31/2017.  The patient was discussed with Dr. Leonette Monarch.  What exists of the patient's chart was reviewed in depth and summarized above.  Clinical condition: stable.        Edwin Dada [facility] Pager [phone]

## 2017-04-01 NOTE — Progress Notes (Signed)
Nutrition Brief Note  Patient identified on the Malnutrition Screening Tool (MST) Report  Wt Readings from Last 15 Encounters:  04/01/17 189 lb 9.5 oz (86 kg)  03/30/17 189 lb 4.8 oz (85.9 kg)  03/06/17 188 lb 12.8 oz (85.6 kg)  02/24/17 192 lb 3.2 oz (87.2 kg)    Body mass index is 28.83 kg/m. Patient meets criteria for normal weight based on current BMI.   Current diet order is heart healthy, patient is consuming approximately 75-100% of meals at this time. Labs and medications reviewed.   Pt currently ordered for Ensure and reports drinking these at home.   No nutrition interventions warranted at this time. If nutrition issues arise, please consult RD.   Koleen Distance, RD, LDN Pager #- [phone]

## 2017-04-01 NOTE — Consult Note (Signed)
Reason for Consult:   Chest pain-SOB  Requesting Physician: Dr Alfredia Ferguson Primary Cardiologist In [place]  HPI:   David Guerra is a 83 y.o. male who is being seen today for the evaluation of chest pain and SOB at the request of Dr Alfredia Ferguson  83 y/o David Guerra male from [place] who we first came in conatct with in March this year. The pt apprently had an MI with presumed VF in Dec in [place]. He did not have a PCI but was discharged home with an ICD. He was re admitted in [place] with chest pain and had a stent placed. He came back to the hospital again in Jan and had a second stent placed, (no details). He then moved to [place] to be near family. He was admitted 02/21/17-02/24/17 with chest pain. Myoview low risk-scar, echo showed EF 35-40%. He was discharged and re admitted 4/12/-13/18 with bronchitis. He again was admitted with chest pain and SOB 03/26/17-03/30/17. His Lasix was cut back at discharge secondary to renal insufficiency. He was supposed to return to [place] next week. He felt OK after discharge but developed SOB in the middle of the night. He didn't want to come back to the hospital but his daughter insisted. His BNP on 03/26/17 was 1148, his BNP on 5/8 was 1101 suggesting he was probably inadequately diuresed. His Troponin's have remained negative.   PMHx:  Past Medical History:  Diagnosis Date  . Acute on chronic systolic and diastolic heart failure, NYHA class 4 ([place]) 02/21/2017  . AICD (automatic cardioverter/defibrillator) present    [employer]  . Anemia   . Cancer ([place])    mulitple myeloma  . Chronic systolic CHF (congestive heart failure), NYHA class 3 ([place])   . CKD (chronic kidney disease), stage III   . Coronary artery disease   . Diabetes mellitus without complication ([place])   . Hypertension   . Multiple myeloma ([place])    "the doctor says it's resting"  . Myocardial infarction ([place])   . Prostate cancer ([place])   . PUD (peptic ulcer disease)     Past Surgical History:  Procedure  Laterality Date  . APPENDECTOMY    . BACK SURGERY  1999  . CARDIAC DEFIBRILLATOR PLACEMENT  11/2016   [employer]  . CARDIAC DEFIBRILLATOR PLACEMENT    . CORONARY ANGIOPLASTY WITH STENT PLACEMENT  11/2016   total of 2 stents, 1 in January, 1 in February, both in [facility] in [place], [place]    SOCHx:  reports that he has never smoked. He has never used smokeless tobacco. He reports that he drinks alcohol. He reports that he does not use drugs.  FAMHx: Family History  Problem Relation Age of Onset  . Dementia Mother   . Cancer Father   . Prostate cancer Brother   . Prostate cancer Brother     ALLERGIES: No Known Allergies  ROS: Review of Systems: General: negative for chills, fever, night sweats or weight changes.  Cardiovascular: negative for edema,  palpitations, paroxysmal nocturnal dyspnea  HEENT: negative for any visual disturbances, blindness, glaucoma Dermatological: negative for rash Respiratory: negative for cough, hemoptysis, or wheezing Urologic: negative for hematuria or dysuria Abdominal: negative for nausea, vomiting, diarrhea, bright red blood per rectum, melena, or hematemesis Neurologic: negative for visual changes, syncope, or dizziness Musculoskeletal: negative for back pain, joint pain, or swelling Psych: cooperative and appropriate All other systems reviewed and are otherwise  negative except as noted above.   HOME MEDICATIONS: Prior to Admission medications   Medication Sig Start Date End Date Taking? Authorizing Provider  allopurinol (ZYLOPRIM) 300 MG tablet Take 300 mg by mouth daily. For gout 01/16/17  Yes [provider]  amiodarone (PACERONE) 200 MG tablet Take 1 tablet (200 mg total) by mouth daily. 02/25/17  Yes Reyne Dumas, MD  aspirin EC 81 MG tablet Take 81 mg by mouth daily. For heart   Yes [provider]  atorvastatin (LIPITOR) 40 MG tablet Take 40 mg by mouth at bedtime. For  lowering cholesterol 01/16/17  Yes [provider]  carvedilol (COREG) 12.5 MG tablet Take 12.5 mg by mouth every 12 (twelve) hours. For heart 01/16/17  Yes [provider]  clopidogrel (PLAVIX) 75 MG tablet Take 75 mg by mouth daily. For blood thinning 01/29/17  Yes [provider]  ELIQUIS 5 MG TABS tablet Take 5 mg by mouth 2 (two) times daily. 03/18/17  Yes [provider]  fluticasone (FLONASE) 50 MCG/ACT nasal spray Place 1 spray into both nostrils at bedtime. 01/29/17  Yes [provider]  furosemide (LASIX) 20 MG tablet Take 2 tablets (40 mg total) by mouth 2 (two) times daily. 03/30/17  Yes Hongalgi, Lenis Dickinson, MD  glipiZIDE (GLUCOTROL) 5 MG tablet Take 2.5 mg by mouth daily. 03/25/17  Yes [provider]  hydrALAZINE (APRESOLINE) 50 MG tablet Take 1 tablet (50 mg total) by mouth every 6 (six) hours. 03/30/17 04/29/17 Yes Hongalgi, Lenis Dickinson, MD  isosorbide dinitrate (ISORDIL) 30 MG tablet Take 1 tablet (30 mg total) by mouth 3 (three) times daily. 03/30/17 04/29/17 Yes Hongalgi, Lenis Dickinson, MD  latanoprost (XALATAN) 0.005 % ophthalmic solution Place 1 drop into both eyes at bedtime.   Yes [provider]  nitroGLYCERIN (NITROSTAT) 0.4 MG SL tablet Place 1 tablet (0.4 mg total) under the tongue every 5 (five) minutes as needed for chest pain. Up to 3 doses per episode of chest pain. 03/30/17  Yes Hongalgi, Lenis Dickinson, MD  potassium chloride SA (K-DUR,KLOR-CON) 20 MEQ tablet Take 20 mEq by mouth 2 (two) times daily.   Yes [provider]  tamsulosin (FLOMAX) 0.4 MG CAPS capsule Take 0.4 mg by mouth daily. 01/16/17  Yes [provider]  vitamin B-12 (CYANOCOBALAMIN) 1000 MCG tablet Take 1,000 mcg by mouth daily.   Yes [provider]    HOSPITAL MEDICATIONS: I have reviewed the patient's current medications.  VITALS: Blood pressure (!) 145/70, pulse (!) 57, temperature 98.3 F (36.8 C), temperature source Oral, resp. rate 18,  height _0  (1.727 m), weight 189 lb 9.5 oz (86 kg), SpO2 100 %.  PHYSICAL EXAM: General appearance: alert, cooperative, no distress and on O2 Neck: no JVD and bilateral CA bruits Lungs: decreased breath sounds 1/3 up on Rt Heart: irregularly irregular rhythm Abdomen: soft, non-tender; bowel sounds normal; no masses,  no organomegaly Extremities: extremities normal, atraumatic, no cyanosis or edema Pulses: 2+ and symmetric Skin: Skin color, texture, turgor normal. No rashes or lesions Neurologic: Grossly normal  LABS: Results for orders placed or performed during the hospital encounter of 03/31/17 (from the past 24 hour(s))  CBC     Status: Abnormal   Collection Time: 03/31/17  3:54 PM  Result Value Ref Range   WBC 6.7 4.0 - 10.5 K/uL   RBC 3.26 (L) 4.22 - 5.81 MIL/uL   Hemoglobin 8.8 (L) 13.0 - 17.0 g/dL   HCT 27.0 (L) 39.0 - 52.0 %  MCV 82.8 78.0 - 100.0 fL   MCH 27.0 26.0 - 34.0 pg   MCHC 32.6 30.0 - 36.0 g/dL   RDW 17.9 (H) 11.5 - 15.5 %   Platelets 158 150 - 400 K/uL  Comprehensive metabolic panel     Status: Abnormal   Collection Time: 03/31/17  3:54 PM  Result Value Ref Range   Sodium 140 135 - 145 mmol/L   Potassium 3.7 3.5 - 5.1 mmol/L   Chloride 110 101 - 111 mmol/L   CO2 25 22 - 32 mmol/L   Glucose, Bld 112 (H) 65 - 99 mg/dL   BUN 24 (H) 6 - 20 mg/dL   Creatinine, Ser 1.55 (H) 0.61 - 1.24 mg/dL   Calcium 8.4 (L) 8.9 - 10.3 mg/dL   Total Protein 6.5 6.5 - 8.1 g/dL   Albumin 3.3 (L) 3.5 - 5.0 g/dL   AST 27 15 - 41 U/L   ALT 52 17 - 63 U/L   Alkaline Phosphatase 55 38 - 126 U/L   Total Bilirubin 0.4 0.3 - 1.2 mg/dL   GFR calc non Af Amer 40 (L) >60 mL/min   GFR calc Af Amer 46 (L) >60 mL/min   Anion gap 5 5 - 15  Brain natriuretic peptide     Status: Abnormal   Collection Time: 03/31/17  3:54 PM  Result Value Ref Range   B Natriuretic Peptide 1,101.3 (H) 0.0 - 100.0 pg/mL  Lipase, blood     Status: None   Collection Time: 03/31/17  3:54 PM  Result Value  Ref Range   Lipase 13 11 - 51 U/L  I-stat troponin, ED     Status: None   Collection Time: 03/31/17  4:06 PM  Result Value Ref Range   Troponin i, poc 0.03 0.00 - 0.08 ng/mL   Comment 3          Troponin I     Status: Abnormal   Collection Time: 03/31/17  7:52 PM  Result Value Ref Range   Troponin I 0.03 (HH) <0.03 ng/mL  Glucose, capillary     Status: None   Collection Time: 04/01/17 12:46 AM  Result Value Ref Range   Glucose-Capillary 93 65 - 99 mg/dL  Basic metabolic panel     Status: Abnormal   Collection Time: 04/01/17  4:59 AM  Result Value Ref Range   Sodium 143 135 - 145 mmol/L   Potassium 3.1 (L) 3.5 - 5.1 mmol/L   Chloride 109 101 - 111 mmol/L   CO2 26 22 - 32 mmol/L   Glucose, Bld 97 65 - 99 mg/dL   BUN 19 6 - 20 mg/dL   Creatinine, Ser 1.44 (H) 0.61 - 1.24 mg/dL   Calcium 8.4 (L) 8.9 - 10.3 mg/dL   GFR calc non Af Amer 44 (L) >60 mL/min   GFR calc Af Amer 51 (L) >60 mL/min   Anion gap 8 5 - 15  Troponin I     Status: Abnormal   Collection Time: 04/01/17  4:59 AM  Result Value Ref Range   Troponin I 0.03 (HH) <0.03 ng/mL  Glucose, capillary     Status: None   Collection Time: 04/01/17  7:44 AM  Result Value Ref Range   Glucose-Capillary 94 65 - 99 mg/dL    EKG: A paced  IMAGING: Dg Chest 2 View  Result Date: 03/31/2017 CLINICAL DATA:  Shortness of breath and chest pain EXAM: CHEST  2 VIEW COMPARISON:  Chest radiograph 03/26/2017 FINDINGS: Left  chest wall AICD leads are in unchanged position. Cardiomegaly is unchanged. No focal airspace consolidation or pulmonary edema. There is pulmonary vascular congestion. No pleural effusion or pneumothorax. IMPRESSION: Pulmonary vascular congestion without overt pulmonary edema. Unchanged cardiomegaly. Electronically Signed   By: Ulyses Jarred M.D.   On: 03/31/2017 15:54   Ct Angio Chest Pe W Or Wo Contrast  Result Date: 03/31/2017 CLINICAL DATA:  Chest pain and history of congestive heart failure. Shortness of breath.  EXAM: CT ANGIOGRAPHY CHEST WITH CONTRAST TECHNIQUE: Multidetector CT imaging of the chest was performed using the standard protocol during bolus administration of intravenous contrast. Multiplanar CT image reconstructions and MIPs were obtained to evaluate the vascular anatomy. CONTRAST:  80 mL Isovue 370 IV COMPARISON:  Chest radiograph 03/31/2017 FINDINGS: Cardiovascular: Contrast injection is sufficient to demonstrate satisfactory opacification of the pulmonary arteries to the segmental level. There is no pulmonary embolus. The main pulmonary artery is mildly enlarged, measuring 3.3 cm at the bifurcation. There is no CT evidence of acute right heart strain. There is mild atherosclerotic calcification of the aorta. There is a normal variant aortic arch branching pattern with the brachiocephalic and left common carotid arteries sharing a common origin. Heart size is enlarged, with a small pericardial effusion measuring 6 mm in thickness. There are AICD leads and coronary artery stents. Mediastinum/Nodes: No mediastinal, hilar or axillary lymphadenopathy. The visualized thyroid and thoracic esophageal course are unremarkable. Lungs/Pleura: There are large right and small left pleural effusions. There are multifocal ground-glass opacities throughout both lungs in a pattern most suggestive of pulmonary edema. There is bibasilar atelectasis. Upper Abdomen: Contrast bolus timing is not optimized for evaluation of the abdominal organs. There are multiple hypoattenuating lesions scattered throughout the liver, measuring up to 1.7 cm. The other visualized portions of the upper abdominal organs are normal. Musculoskeletal: There is diffuse osteopenia with a trabeculated appearance of all of the vertebral bodies. Review of the MIP images confirms the above findings. IMPRESSION: 1. No pulmonary embolus. 2. Cardiomegaly, small pericardial effusion, bilateral pleural effusions and multifocal ground-glass opacities, most  consistent with congestive heart failure. 3. Mildly enlarged main pulmonary artery, which may be seen in the setting of pulmonary hypertension. 4. Diffuse osteopenia, consistent with multiple myeloma, suggesting diffuse marrow involvement. 5. Aortic atherosclerosis. Electronically Signed   By: Ulyses Jarred M.D.   On: 03/31/2017 17:55    IMPRESSION:  Recurrent chest pain and SOB This may be secondary to persistent systolic CHF based on his BNPs  CAD s/p PCI with stents on two occasions in [facility] Jan 2018. Myoview showed scar but no ischemia 03/29/17- Troponin's have been negative  Hx of MI  MI Dec 2017 in [place]- no PCI then but discharged with an ICD  Chronic renal insufficiency SCr consistently 1.8-2.0 since March  Ischemic cardiomyopathy EF 35-40% by echo 02/22/17  ICD [employer]. Pt also on Amiodarone at PAF dose  History of DVT No details, he is on Eliquis  Type 2 DM   RECOMMENDATION: Will review with MD but his recurrent symptoms of chest pressure and SOB- (described as orthopnea this adm) may all be from persistent CHF. Consider pushing Lasix to 80 mg IV BID-(I did not order). Follow BNP.  Renal consult if he develops increasingly renal insufficiency. His wgt has been pretty steady throughout his admissions- 193 on 4/2 to 189 lbs this adm son not sure what his dry wgt is.   Will stop ASA as he is on Eliquis and Plavix and his stent  was > 30 days ago.   Time Spent Directly with Patient:  50 minutes  Kerin Ransom, [place] beeper 04/01/2017, 9:37 AM    The patient was seen, examined and discussed with Kerin Ransom, PA-C and I agree with the above.   . allopurinol  300 mg Oral Daily  . amiodarone  200 mg Oral Daily  . apixaban  5 mg Oral BID  . atorvastatin  40 mg Oral QHS  . carvedilol  12.5 mg Oral Q12H  . clopidogrel  75 mg Oral Daily  . feeding supplement (ENSURE ENLIVE)  237 mL Oral BID BM  . furosemide  40 mg Intravenous BID  . hydrALAZINE  50 mg Oral Q6H    . insulin aspart  0-5 Units Subcutaneous QHS  . insulin aspart  0-9 Units Subcutaneous TID WC  . isosorbide dinitrate  30 mg Oral TID  . pantoprazole  40 mg Oral Daily  . potassium chloride SA  20 mEq Oral BID  . sodium chloride flush  3 mL Intravenous Q12H  . tamsulosin  0.4 mg Oral Daily   Ena Dawley, MD 04/01/2017  The patient was seen, examined and discussed with Kerin Ransom, PA-C and I agree with the above.   83 y/o AA male from [place], with known CAD, h/o MI with presumed VF in Dec in [place]. S/P PCI and ICD placement. He was re admitted in [place] with chest pain and had a stent placed. He came back to the hospital again in Jan and had a second stent placed, (no details). He then moved to [place] to be near family. He was admitted 02/21/17-02/24/17 with chest pain. Myoview low risk-scar, echo showed EF 35-40%. He was discharged and re admitted 4/12/-13/18 with bronchitis. He again was admitted with chest pain and SOB 03/26/17-03/30/17. His Lasix was cut back at discharge secondary to renal insufficiency. He was supposed to return to [place] next week. He felt OK after discharge but developed SOB in the middle of the night.  Assessment: Acute on chronic combined systolic and diastolic CHF, on Lasix 20 mg po bid at home, started on lasix iv 40 mg BID, with good diuresis and negative 1800 cc overnight, improvement of Crea 2.1 -> 1,44, he is still fluid overloaded on physical exam, with elevated JVDs and crackles at the left lung base. Continue iv lasix till tomorrow. Send home on lasix 40 mg PO BID. BP elevated, increase hydralazine to 75 mg po TID.  No ischemic workup needed as troponin negative, chest pain resolved with diuresis and negative stress test on 03/29/17.  Ena Dawley, MD 04/01/2017  .knpro

## 2017-04-01 NOTE — Progress Notes (Signed)
PROGRESS NOTE    David Guerra  EXB:[id number] DOB: 03-30-1933 DOA: 03/31/2017 PCP: System, Pcp Not In  Brief Narrative:  David Guerra is a 83 y.o. male with a past medical history significant for multiple myeloma, CAD s/p PCI last Dec and VF arrest with ICD, CHD EF 35%, COPD, IDDM, and recent DVT who presents with chest discomfort again.  The patient was just admitted from 5/3 until yesterday for chest pain.  Seen by Cardiology, had echo that showed EF 35%, had Myoview that showed a single fixed defect. He was diuresed with IV lasix initially, but it appears his creatinine increased from 1.8 to 2.2 and so this was stopped and his Lasix at discharge was changed from 60 BID to 40 BID.  Seems he had stabilized and felt ready for d/c by yesterday.  He got home on the evening of 5/7 at [facility] and took his evening meds and went to bed.  Then this morning at 4AM he woke with shortness of breath, feeling of pressure or squeezing on his chest.  He got up and took a shower and maybe the pain got better.  He had no nitro to take.  The feeling of dyspnea persisted all day, and by the afternoon his family convinced him to come to the ER.  There was no diaphoresis, no obvious orthopnea or positional change.  No leg swelling, cough.  Granddaughter thinks he told her he had hemoptysis. Cardiology evaluated and currently being diuresed. Patient states hemoptysis happens early morning when he first wakes up.   Assessment & Plan:   Principal Problem:   Acute on chronic systolic and diastolic heart failure, NYHA class 4 (HCC) Active Problems:   Essential hypertension   Coronary artery disease due to lipid rich plaque   Type 2 diabetes mellitus with stage 3 chronic kidney disease, without long-term current use of insulin (HCC)   CKD (chronic kidney disease), stage III   Normocytic anemia  1. Acute on chronic combined Systolic and Diastolic CHF:  -Suspect his pain and dyspnea are from CHF (appears volume  overloaded on exam, CT chest, and based on BNP) and exacerbated by HTN.   -Diuresis limited during last hospitalization by worsening creatinine.   Dry weight in Apr ~192 lbs at discharge -LVEF 35-40 percent by echo 02/2017 -C/w Furosemide IV -Consult to Cardiology, appreciate Recc's -> recommending Continuing IV Lasix 40 mg BID and Continue IV lasix -When D/C'g Home D/C on po 40 Lasix BID -Control BP -Strict I/Os (-2.08 Liters) -Daily weights,  -Repeat CMP in AM  2. Type 2 diabetes:  -Hold glipizide -C/w Sensitive Novolog SSI with meals and Bedtime  3. Hypertension and Coronary artery disease s/p VF arrest with ICD:  -Recent stents in Dec. In [place] -Recent Negative Stress Test on 03/29/17 -Continue hydralazine (Increased to 75 mg po TID), Isordil, BB -ASA stopped as Patient is on Eliquis -Continue Plavix, PPI -Continue amiodarone at PAF Dose per Cards  4. Recent DVT:  -Continue Eliquis 5 mg po BID  5. CKD stage III: -Cr Improved with Diuresis and now went from 1.83 -> 1.44 -Repeat CMP in AM  6. Normocytic anemia: Baseline 9-10, near baseline -Hb/Hct was 8.6/27.4 -Repeat CBC in AM  7. Mild Hemoptysis -Patient was on ASA, Plavix and Eliquis, -ASA now D/C'd -Continue to Monitor  8. Hx of Prostate Cancer/BPH -C/w Flomax  9. Hypokalemia -Patient's K+ was 3.1 -Replete -Repeat CMP in AM  10. Hx of Gout -C/w with Allopurinol  DVT prophylaxis: Anticoagulated  with Elquis Code Status: FULL CODE Family Communication: No family present at bedside Disposition Plan: Likely Home at D/C  Consultants:   Cardiology   Procedures: None   Antimicrobials:  Anti-infectives    None     Subjective: Seen and examined and was feeling better. States he is less swollen. No nausea or vomiting but thinks he has had hemoptysis early in AM when he first wakes up for the last several days. No other concerns or complaints.   Objective: Vitals:   03/31/17 2343 03/31/17 2347  04/01/17 0457 04/01/17 0500  BP: (!) 190/79  (!) 145/70   Pulse: 60  (!) 57   Resp: (!) 22  18   Temp: 98.2 F (36.8 C)  98.3 F (36.8 C)   TempSrc: Oral  Oral   SpO2: 97%  100%   Weight:  86 kg (189 lb 9.5 oz)  86 kg (189 lb 9.5 oz)  Height:  5' 8"  (1.727 m)      Intake/Output Summary (Last 24 hours) at 04/01/17 0843 Last data filed at 04/01/17 0459  Gross per 24 hour  Intake                0 ml  Output             1300 ml  Net            -1300 ml   Filed Weights   03/31/17 1446 03/31/17 2347 04/01/17 0500  Weight: 83 kg (183 lb) 86 kg (189 lb 9.5 oz) 86 kg (189 lb 9.5 oz)    Examination: Physical Exam:  Constitutional: WN/WD, NAD and appears calm and comfortable Eyes: Lids and conjunctivae normal, sclerae anicteric  ENMT: External Ears, Nose appear normal. Grossly normal hearing. Neck: Appears normal, supple, no cervical masses, normal ROM, no appreciable thyromegaly, had some JVD Respiratory: Dimnished at the bases to auscultation bilaterally, no wheezing, rales, rhonchi or crackles. Normal respiratory effort and patient is not tachypenic. No accessory muscle use.  Cardiovascular: RRR, no murmurs / rubs / gallops. S1 and S2 auscultated. No extremity edema. 2+ pedal pulses. No carotid bruits.  Abdomen: Soft, non-tender, non-distended. No masses palpated. No appreciable hepatosplenomegaly. Bowel sounds positive x4.  GU: Deferred. Musculoskeletal: No clubbing / cyanosis of digits/nails. No joint deformity upper and lower extremities. Good ROM, no contractures. Normal strength and muscle tone.  Skin: No rashes, lesions, ulcers on limited skin evaluation. No induration; Warm and dry.  Neurologic: CN 2-12 grossly intact with no focal deficits. Romberg sign cerebellar reflexes not assessed.  Psychiatric: Normal judgment and insight. Alert and oriented x 3. Normal mood and appropriate affect.   Data Reviewed: I have personally reviewed following labs and imaging  studies  CBC:  Recent Labs Lab 03/26/17 1735 03/27/17 1026 03/28/17 0448 03/30/17 0419 03/31/17 1554  WBC 5.2 4.9 5.4 4.9 6.7  HGB 8.3* 8.5* 8.8* 8.1* 8.8*  HCT 25.8* 25.9* 27.5* 25.4* 27.0*  MCV 84.9 84.4 84.4 81.9 82.8  PLT 153 134* 147* 149* 032   Basic Metabolic Panel:  Recent Labs Lab 03/28/17 0448 03/29/17 0235 03/30/17 0419 03/31/17 1554 04/01/17 0459  NA 141 141 139 140 143  K 3.7 3.5 3.4* 3.7 3.1*  CL 106 106 107 110 109  CO2 26 27 26 25 26   GLUCOSE 87 92 92 112* 97  BUN 21* 24* 20 24* 19  CREATININE 1.89* 2.16* 1.83* 1.55* 1.44*  CALCIUM 8.2* 8.4* 8.0* 8.4* 8.4*   GFR: Estimated Creatinine Clearance: 42.2  mL/min (A) (by C-G formula based on SCr of 1.44 mg/dL (H)). Liver Function Tests:  Recent Labs Lab 03/31/17 1554  AST 27  ALT 52  ALKPHOS 55  BILITOT 0.4  PROT 6.5  ALBUMIN 3.3*    Recent Labs Lab 03/31/17 1554  LIPASE 13   No results for input(s): AMMONIA in the last 168 hours. Coagulation Profile: No results for input(s): INR, PROTIME in the last 168 hours. Cardiac Enzymes:  Recent Labs Lab 03/26/17 2216 03/27/17 0327 03/27/17 1026 03/31/17 1952 04/01/17 0459  TROPONINI 0.03* 0.03* <0.03 0.03* 0.03*   BNP (last 3 results) No results for input(s): PROBNP in the last 8760 hours. HbA1C: No results for input(s): HGBA1C in the last 72 hours. CBG:  Recent Labs Lab 03/29/17 2008 03/30/17 0728 03/30/17 1119 04/01/17 0046 04/01/17 0744  GLUCAP 103* 86 172* 93 94   Lipid Profile: No results for input(s): CHOL, HDL, LDLCALC, TRIG, CHOLHDL, LDLDIRECT in the last 72 hours. Thyroid Function Tests: No results for input(s): TSH, T4TOTAL, FREET4, T3FREE, THYROIDAB in the last 72 hours. Anemia Panel: No results for input(s): VITAMINB12, FOLATE, FERRITIN, TIBC, IRON, RETICCTPCT in the last 72 hours. Sepsis Labs: No results for input(s): PROCALCITON, LATICACIDVEN in the last 168 hours.  No results found for this or any previous  visit (from the past 240 hour(s)).   Radiology Studies: Dg Chest 2 View  Result Date: 03/31/2017 CLINICAL DATA:  Shortness of breath and chest pain EXAM: CHEST  2 VIEW COMPARISON:  Chest radiograph 03/26/2017 FINDINGS: Left chest wall AICD leads are in unchanged position. Cardiomegaly is unchanged. No focal airspace consolidation or pulmonary edema. There is pulmonary vascular congestion. No pleural effusion or pneumothorax. IMPRESSION: Pulmonary vascular congestion without overt pulmonary edema. Unchanged cardiomegaly. Electronically Signed   By: Ulyses Jarred M.D.   On: 03/31/2017 15:54   Ct Angio Chest Pe W Or Wo Contrast  Result Date: 03/31/2017 CLINICAL DATA:  Chest pain and history of congestive heart failure. Shortness of breath. EXAM: CT ANGIOGRAPHY CHEST WITH CONTRAST TECHNIQUE: Multidetector CT imaging of the chest was performed using the standard protocol during bolus administration of intravenous contrast. Multiplanar CT image reconstructions and MIPs were obtained to evaluate the vascular anatomy. CONTRAST:  80 mL Isovue 370 IV COMPARISON:  Chest radiograph 03/31/2017 FINDINGS: Cardiovascular: Contrast injection is sufficient to demonstrate satisfactory opacification of the pulmonary arteries to the segmental level. There is no pulmonary embolus. The main pulmonary artery is mildly enlarged, measuring 3.3 cm at the bifurcation. There is no CT evidence of acute right heart strain. There is mild atherosclerotic calcification of the aorta. There is a normal variant aortic arch branching pattern with the brachiocephalic and left common carotid arteries sharing a common origin. Heart size is enlarged, with a small pericardial effusion measuring 6 mm in thickness. There are AICD leads and coronary artery stents. Mediastinum/Nodes: No mediastinal, hilar or axillary lymphadenopathy. The visualized thyroid and thoracic esophageal course are unremarkable. Lungs/Pleura: There are large right and small left  pleural effusions. There are multifocal ground-glass opacities throughout both lungs in a pattern most suggestive of pulmonary edema. There is bibasilar atelectasis. Upper Abdomen: Contrast bolus timing is not optimized for evaluation of the abdominal organs. There are multiple hypoattenuating lesions scattered throughout the liver, measuring up to 1.7 cm. The other visualized portions of the upper abdominal organs are normal. Musculoskeletal: There is diffuse osteopenia with a trabeculated appearance of all of the vertebral bodies. Review of the MIP images confirms the above findings. IMPRESSION:  1. No pulmonary embolus. 2. Cardiomegaly, small pericardial effusion, bilateral pleural effusions and multifocal ground-glass opacities, most consistent with congestive heart failure. 3. Mildly enlarged main pulmonary artery, which may be seen in the setting of pulmonary hypertension. 4. Diffuse osteopenia, consistent with multiple myeloma, suggesting diffuse marrow involvement. 5. Aortic atherosclerosis. Electronically Signed   By: Ulyses Jarred M.D.   On: 03/31/2017 17:55   Scheduled Meds: . allopurinol  300 mg Oral Daily  . amiodarone  200 mg Oral Daily  . apixaban  5 mg Oral BID  . aspirin EC  81 mg Oral Daily  . atorvastatin  40 mg Oral QHS  . carvedilol  12.5 mg Oral Q12H  . clopidogrel  75 mg Oral Daily  . feeding supplement (ENSURE ENLIVE)  237 mL Oral BID BM  . furosemide  40 mg Intravenous BID  . hydrALAZINE  50 mg Oral Q6H  . insulin aspart  0-5 Units Subcutaneous QHS  . insulin aspart  0-9 Units Subcutaneous TID WC  . isosorbide dinitrate  30 mg Oral TID  . pantoprazole  40 mg Oral Daily  . potassium chloride SA  20 mEq Oral BID  . sodium chloride flush  3 mL Intravenous Q12H  . tamsulosin  0.4 mg Oral Daily   Continuous Infusions:   LOS: 0 days   Kerney Elbe, DO [facility] Pager [phone]  If 7PM-7AM, please contact night-coverage [email or web address] Password  [facility] 04/01/2017, 8:43 AM

## 2017-04-02 LAB — CBC WITH DIFFERENTIAL/PLATELET
BASOS PCT: 0 %
Basophils Absolute: 0 10*3/uL (ref 0.0–0.1)
Eosinophils Absolute: 0.2 10*3/uL (ref 0.0–0.7)
Eosinophils Relative: 3 %
HEMATOCRIT: 25.9 % — AB (ref 39.0–52.0)
Hemoglobin: 8.6 g/dL — ABNORMAL LOW (ref 13.0–17.0)
LYMPHS ABS: 1 10*3/uL (ref 0.7–4.0)
LYMPHS PCT: 16 %
MCH: 28.1 pg (ref 26.0–34.0)
MCHC: 33.2 g/dL (ref 30.0–36.0)
MCV: 84.6 fL (ref 78.0–100.0)
MONO ABS: 0.6 10*3/uL (ref 0.1–1.0)
MONOS PCT: 10 %
NEUTROS ABS: 4.6 10*3/uL (ref 1.7–7.7)
Neutrophils Relative %: 71 %
Platelets: 157 10*3/uL (ref 150–400)
RBC: 3.06 MIL/uL — ABNORMAL LOW (ref 4.22–5.81)
RDW: 18 % — AB (ref 11.5–15.5)
WBC: 6.5 10*3/uL (ref 4.0–10.5)

## 2017-04-02 LAB — COMPREHENSIVE METABOLIC PANEL
ALT: 44 U/L (ref 17–63)
ANION GAP: 5 (ref 5–15)
AST: 19 U/L (ref 15–41)
Albumin: 2.9 g/dL — ABNORMAL LOW (ref 3.5–5.0)
Alkaline Phosphatase: 47 U/L (ref 38–126)
BUN: 23 mg/dL — ABNORMAL HIGH (ref 6–20)
CALCIUM: 8.3 mg/dL — AB (ref 8.9–10.3)
CO2: 29 mmol/L (ref 22–32)
Chloride: 107 mmol/L (ref 101–111)
Creatinine, Ser: 1.58 mg/dL — ABNORMAL HIGH (ref 0.61–1.24)
GFR, EST AFRICAN AMERICAN: 45 mL/min — AB (ref >60)
GFR, EST NON AFRICAN AMERICAN: 39 mL/min — AB (ref >60)
Glucose, Bld: 97 mg/dL (ref 65–99)
POTASSIUM: 3.5 mmol/L (ref 3.5–5.1)
Sodium: 141 mmol/L (ref 135–145)
Total Bilirubin: 0.5 mg/dL (ref 0.3–1.2)
Total Protein: 5.7 g/dL — ABNORMAL LOW (ref 6.5–8.1)

## 2017-04-02 LAB — PHOSPHORUS: Phosphorus: 3.2 mg/dL (ref 2.5–4.6)

## 2017-04-02 LAB — GLUCOSE, CAPILLARY
GLUCOSE-CAPILLARY: 94 mg/dL (ref 65–99)
GLUCOSE-CAPILLARY: 212 mg/dL — AB (ref 65–99)

## 2017-04-02 LAB — MAGNESIUM: MAGNESIUM: 1.9 mg/dL (ref 1.7–2.4)

## 2017-04-02 MED ORDER — FUROSEMIDE 40 MG PO TABS: 40.0000 mg | ORAL_TABLET | Freq: Two times a day (BID) | ORAL | 0 refills | Status: DC

## 2017-04-02 MED ORDER — HYDRALAZINE HCL 25 MG PO TABS: 75.0000 mg | ORAL_TABLET | Freq: Four times a day (QID) | ORAL | 0 refills | Status: AC

## 2017-04-02 MED ORDER — HYDRALAZINE HCL 25 MG PO TABS: 75.0000 mg | ORAL_TABLET | Freq: Four times a day (QID) | ORAL | 0 refills | Status: DC

## 2017-04-02 MED ORDER — FUROSEMIDE 40 MG PO TABS: 40.0000 mg | ORAL_TABLET | Freq: Two times a day (BID) | ORAL | Status: DC

## 2017-04-02 MED ORDER — PANTOPRAZOLE SODIUM 40 MG PO TBEC: 40.0000 mg | DELAYED_RELEASE_TABLET | Freq: Every day | ORAL | 0 refills | Status: AC

## 2017-04-02 MED ORDER — ENSURE ENLIVE PO LIQD: 237.0000 mL | Freq: Two times a day (BID) | ORAL | 12 refills | Status: AC

## 2017-04-02 MED ORDER — PANTOPRAZOLE SODIUM 40 MG PO TBEC: 40.0000 mg | DELAYED_RELEASE_TABLET | Freq: Every day | ORAL | 0 refills | Status: DC

## 2017-04-02 MED ORDER — ENSURE ENLIVE PO LIQD: 237.0000 mL | Freq: Two times a day (BID) | ORAL | 12 refills | Status: DC

## 2017-04-02 NOTE — Progress Notes (Signed)
Progress Note  Patient Name: David Guerra Date of Encounter: 04/02/2017  Primary Cardiologist: Followed in [place] better. Eager to go home, back to [place], but still using supplemental O2. Denies CP.   Inpatient Medications    Scheduled Meds: . allopurinol  300 mg Oral Daily  . amiodarone  200 mg Oral Daily  . apixaban  5 mg Oral BID  . atorvastatin  40 mg Oral QHS  . carvedilol  12.5 mg Oral Q12H  . clopidogrel  75 mg Oral Daily  . feeding supplement (ENSURE ENLIVE)  237 mL Oral BID BM  . furosemide  40 mg Intravenous BID  . hydrALAZINE  75 mg Oral Q6H  . insulin aspart  0-5 Units Subcutaneous QHS  . insulin aspart  0-9 Units Subcutaneous TID WC  . isosorbide dinitrate  30 mg Oral TID  . pantoprazole  40 mg Oral Daily  . potassium chloride SA  20 mEq Oral BID  . sodium chloride flush  3 mL Intravenous Q12H  . tamsulosin  0.4 mg Oral Daily   Continuous Infusions:  PRN Meds: nitroGLYCERIN   Vital Signs    Vitals:   04/01/17 1243 04/01/17 1300 04/01/17 2131 04/02/17 0430  BP: (!) 160/78 132/69 (!) 142/69 132/65  Pulse: 68 60 60 61  Resp:   18 18  Temp:  98.3 F (36.8 C) 98.5 F (36.9 C) 99.1 F (37.3 C)  TempSrc:  Oral Oral Oral  SpO2:  100% 100% 95%  Weight:    189 lb 13.1 oz (86.1 kg)  Height:        Intake/Output Summary (Last 24 hours) at 04/02/17 0839 Last data filed at 04/02/17 0739  Gross per 24 hour  Intake              360 ml  Output             1875 ml  Net            -1515 ml   Filed Weights   03/31/17 2347 04/01/17 0500 04/02/17 0430  Weight: 189 lb 9.5 oz (86 kg) 189 lb 9.5 oz (86 kg) 189 lb 13.1 oz (86.1 kg)    Telemetry    NSR - Personally Reviewed  ECG    NSR - Personally Reviewed  Physical Exam   GEN: No acute distress.   Neck: No JVD Cardiac: RRR, no murmurs, rubs, or gallops.  Respiratory: faint crackles at the bases  GI: Soft, nontender, non-distended  MS: No edema; No deformity. Neuro:  Nonfocal   Psych: Normal affect   Labs    Chemistry Recent Labs Lab 03/31/17 1554 04/01/17 0459 04/02/17 0440  NA 140 143 141  K 3.7 3.1* 3.5  CL 110 109 107  CO2 25 26 29   GLUCOSE 112* 97 97  BUN 24* 19 23*  CREATININE 1.55* 1.44* 1.58*  CALCIUM 8.4* 8.4* 8.3*  PROT 6.5  --  5.7*  ALBUMIN 3.3*  --  2.9*  AST 27  --  19  ALT 52  --  44  ALKPHOS 55  --  47  BILITOT 0.4  --  0.5  GFRNONAA 40* 44* 39*  GFRAA 46* 51* 45*  ANIONGAP 5 8 5      Hematology Recent Labs Lab 03/31/17 1554 04/01/17 0956 04/02/17 0440  WBC 6.7 4.8 6.5  RBC 3.26* 3.32* 3.06*  HGB 8.8* 8.6* 8.6*  HCT 27.0* 27.4* 25.9*  MCV 82.8 82.5 84.6  MCH 27.0  25.9* 28.1  MCHC 32.6 31.4 33.2  RDW 17.9* 17.8* 18.0*  PLT 158 149* 157    Cardiac Enzymes Recent Labs Lab 03/27/17 0327 03/27/17 1026 03/31/17 1952 04/01/17 0459  TROPONINI 0.03* <0.03 0.03* 0.03*    Recent Labs Lab 03/26/17 1745 03/26/17 2128 03/31/17 1606  TROPIPOC 0.03 0.03 0.03     BNP Recent Labs Lab 03/26/17 2216 03/31/17 1554  BNP 1,148.0* 1,101.3*     DDimer No results for input(s): DDIMER in the last 168 hours.   Radiology    Dg Chest 2 View  Result Date: 03/31/2017 CLINICAL DATA:  Shortness of breath and chest pain EXAM: CHEST  2 VIEW COMPARISON:  Chest radiograph 03/26/2017 FINDINGS: Left chest wall AICD leads are in unchanged position. Cardiomegaly is unchanged. No focal airspace consolidation or pulmonary edema. There is pulmonary vascular congestion. No pleural effusion or pneumothorax. IMPRESSION: Pulmonary vascular congestion without overt pulmonary edema. Unchanged cardiomegaly. Electronically Signed   By: Ulyses Jarred M.D.   On: 03/31/2017 15:54   Ct Angio Chest Pe W Or Wo Contrast  Result Date: 03/31/2017 CLINICAL DATA:  Chest pain and history of congestive heart failure. Shortness of breath. EXAM: CT ANGIOGRAPHY CHEST WITH CONTRAST TECHNIQUE: Multidetector CT imaging of the chest was performed using the standard  protocol during bolus administration of intravenous contrast. Multiplanar CT image reconstructions and MIPs were obtained to evaluate the vascular anatomy. CONTRAST:  80 mL Isovue 370 IV COMPARISON:  Chest radiograph 03/31/2017 FINDINGS: Cardiovascular: Contrast injection is sufficient to demonstrate satisfactory opacification of the pulmonary arteries to the segmental level. There is no pulmonary embolus. The main pulmonary artery is mildly enlarged, measuring 3.3 cm at the bifurcation. There is no CT evidence of acute right heart strain. There is mild atherosclerotic calcification of the aorta. There is a normal variant aortic arch branching pattern with the brachiocephalic and left common carotid arteries sharing a common origin. Heart size is enlarged, with a small pericardial effusion measuring 6 mm in thickness. There are AICD leads and coronary artery stents. Mediastinum/Nodes: No mediastinal, hilar or axillary lymphadenopathy. The visualized thyroid and thoracic esophageal course are unremarkable. Lungs/Pleura: There are large right and small left pleural effusions. There are multifocal ground-glass opacities throughout both lungs in a pattern most suggestive of pulmonary edema. There is bibasilar atelectasis. Upper Abdomen: Contrast bolus timing is not optimized for evaluation of the abdominal organs. There are multiple hypoattenuating lesions scattered throughout the liver, measuring up to 1.7 cm. The other visualized portions of the upper abdominal organs are normal. Musculoskeletal: There is diffuse osteopenia with a trabeculated appearance of all of the vertebral bodies. Review of the MIP images confirms the above findings. IMPRESSION: 1. No pulmonary embolus. 2. Cardiomegaly, small pericardial effusion, bilateral pleural effusions and multifocal ground-glass opacities, most consistent with congestive heart failure. 3. Mildly enlarged main pulmonary artery, which may be seen in the setting of pulmonary  hypertension. 4. Diffuse osteopenia, consistent with multiple myeloma, suggesting diffuse marrow involvement. 5. Aortic atherosclerosis. Electronically Signed   By: Ulyses Jarred M.D.   On: 03/31/2017 17:55    Cardiac Studies   None this admission   Patient Profile     83 y/o AA male from [place], with known CAD, h/o MI with presumed VF in Dec in [place]. S/P PCI and ICD placement. He was re admitted in [place] with chest pain and had a stent placed. He came back to the hospital again in Jan and had a second stent placed, (no details). He  then moved to [place] to be near family. He was admitted 02/21/17-02/24/17 with chest pain. Myoview low risk-scar, echo showed EF 35-40%. He was discharged and re admitted 4/12/-13/18 with bronchitis. He again was admitted with chest pain and SOB 03/26/17-03/30/17. His Lasix was cut back at discharge secondary to renal insufficiency. He was supposed to return to [place] next week. He felt OK after discharge but developed SOB in the middle of the night and was readmitted 03/31/17 for acute on chronic CHF.   Assessment & Plan    1. Acute on Chronic Combined Systolic and Diastolic CHF: - EF 90-38% by echo 02/22/17 - Admit BNP 1,101 and CXR with pulmonary vascular congestion w/o overt pulmonary edema - Diuresing well. -1.7L out in past 24 hrs. I/Os net negative 2.8 L  - Still on supplemental O2. Was not on O2 at home. Try ambulating today on RA to see how patient responds to test if any exertional dyspnea or hypoxia - Continue Lasix, BB, hydralazine and nitrate - Low sodium diet - Daily weights   2. CAD - s/p PCI with stents on two occasions in [facility] Jan 2018. Myoview showed scar but no ischemia 03/29/17- Troponin's have been negative  3. Hx of MI  - MI Dec 2017 in [place]- no PCI then but discharged with an ICD  4. Chronic renal insufficiency - SCr consistently 1.8-2.0 since March - 1.58 today. Continue to monitor.   5. Ischemic cardiomyopathy - EF 35-40% by echo 02/22/17 - Continue BB,  Nitrate and hydralazine. No ACE/ARB given chronic renal insufficiency   6. ICD - [employer]. Pt also on Amiodarone at PAF dose  7. History of DVT - No details, he is on Eliquis  8. Type 2 DM - Per Primary team   Signed, Lyda Jester, PA-C  04/02/2017, 8:39 AM    The patient was seen, examined and discussed with Brittainy M. Rosita Fire, PA-C and I agree with the above.   83 y/o AA male from [place], with known CAD, h/o MI with presumed VF in Dec in [place]. S/P PCI and ICD placement. He was re admitted in [place] with chest pain and had a stent placed. He came back to the hospital again in Jan and had a second stent placed, (no details). He then moved to [place] to be near family. He was admitted 02/21/17-02/24/17 with chest pain. Myoview low risk-scar, echo showed EF 35-40%. He was discharged and re admitted 4/12/-13/18 with bronchitis. He again was admitted with chest pain and SOB 03/26/17-03/30/17. His Lasix was cut back at discharge secondary to renal insufficiency. He was supposed to return to [place] next week. He felt OK after discharge but developed SOB in the middle of the night.  Assessment: Acute on chronic combined systolic and diastolic CHF, on Lasix 20 mg po bid at home, started on lasix iv 40 mg BID, with good diuresis and negative 3100 cc in 48 hours, improvement of Crea 2.1 -> 1,44, now uptrending 1.58. Discontinue iv lasix, start 40 mg po BID, he is ready to walk, if he feels well, he can go home. He should schedule a follow up appointment with his cardiologist in [place] within the next 10 days. No ischemic workup needed as troponin negative, chest pain resolved with diuresis and negative stress test on 03/29/17.  Ena Dawley, MD  04/02/2017

## 2017-04-02 NOTE — Discharge Summary (Signed)
Physician Discharge Summary  [facility] ZOX:[id number] DOB: 10-Aug-1933 DOA: 03/31/2017  PCP: System, Pcp Not In  Admit date: 03/31/2017 Discharge date: 04/02/2017  Admitted From: Home Disposition: Home  Recommendations for Outpatient Follow-up:  1. Follow up with PCP in 1-2 weeks 2. Follow up with Cardiologist in [place] in next 10 days 3. Follow up with Pulmonary Medicine Dr. Melvyn Novas on 04/09/17 at 3:00 pm 4. 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   The patient was just admitted from 5/3 until 5/7 for chest pain. Seen by Cardiology, had echo that showed EF 35%, had Myoview that showed a single fixed defect. He was diuresed with IV lasix initially, but it appears his creatinine increased from 1.8 to 2.2 and so this was stopped and his Lasix at discharge was changed from 60 BID to 40 BID. Seems he had stabilized and felt ready for d/c by yesterday.  He got home on the evening of 5/7 at [facility] and took his evening meds and went to bed. Then the morning of 5/8  at 4AM he woke with shortness of breath, feeling of pressure or squeezing on his chest. He got up and took a shower and felt maybe the pain got better. He had no nitro to take.The feeling of dyspnea persisted all day, and by the afternoon his family convinced him to come to the ER. There was no diaphoresis, no obvious orthopnea or positional change. No leg swelling, cough. Granddaughter thinks he told her he had hemoptysis. Cardiology evaluated and patient was found to have an Acute on Chronic Combined Systolic and  Diastolic CHF Decompensation. He was diuresed and patient's Hemoptysis resolved (ASA was discontinued as patient was on ASA, Plavix, and Eliquis). Patient was deemed medically stable to D/C Home as his symptoms improved and he was diuresed well. He will need to follow up with his PCP, Cardiology and Pulmonology as an outpatient.    Discharge Diagnoses:  Principal Problem:   Acute on chronic systolic and diastolic heart failure, NYHA class 4 (HCC) Active Problems:   Essential hypertension   AICD (automatic cardioverter/defibrillator) present   CAD S/P percutaneous coronary angioplasty   Type 2 diabetes mellitus with stage 3 chronic kidney disease, without long-term current use of insulin (HCC)   CKD (chronic kidney disease), stage III   Normocytic anemia   History of myocardial infarction   Acute on chronic systolic CHF (congestive heart failure) ([place])  1. Acute on chronic combined Systolic and Diastolic CHF: -Suspect his pain and dyspnea are from CHF (appears volume overloaded on exam, CT chest, and based on BNP) and exacerbated by HTN.  -Diuresis limited during last hospitalization by worsening creatinine. Dry weight in Apr ~192 lbs at discharge -LVEF 35-40 percent by echo 02/2017 -Changed Furosemide IV to 40 mg po BID per Cards Reccomendations -Consult to Cardiology, appreciated Recc's -Control BP -Strict I/Os (-2.905 Liters) -Daily weights (Patient is 89) -Repeat CMP as an outpatient -Follow up with Cardiology as an outpatient  2. Type 2 diabetes: -Resume Home Glipizide -Had Sensitive Novolog SSI with meals and Bedtime while Hospitalized  -CBG's ranged from 83-212  3. Hypertension  and Coronary Artery Disease/Ischemic Cardiomyopathy s/pVF arrestwith ICD: -Recent PCI Stents x2 in Dec. In [place] -Recent Negative Stress Test on 03/29/17 -Continue Hydralazine (Increased to 75 mg po TID), Isordil, Beta Blocker -ASA stopped as Patient is on Eliquis -Continue Plavix,  PPI -Continue Amiodarone at PAF Dose per Cards -Per Cards Myoview on 03/29/17 showed scar but no ischemia -Follow up with Cardiology as an outpatient  4. Recent DVT: -Continue Eliquis 5 mg po BID  5. CKD stage III: -Cr went from 1.83 -> 1.44 -> 1.58 -Repeat CMP at SNF  6. Normocytic anemia: Baseline 9-10, near baseline -Hb/Hct was 8.6/25.9 -Repeat CBC as a SNF  7. Mild Hemoptysis -Patient was on ASA, Plavix and Eliquis, -ASA now D/C'd -No Reoccurrence  -Continue to Monitor -Outpatient Follow up with Pulmonology on 04/09/17 at 3:00 pm  8. Hx of Prostate Cancer/BPH -C/w Tamsulosin 0.4 mg po Daily  9. Hypokalemia, improved -Patient's K+ was 3.1 yesterday and improved to 3.5 -C/w with Potassium Chloride 20 mEQ po BID -Repeat CMP at SNF  10. Hx of Gout -C/w with Allopurinol  Discharge Instructions   Allergies as of 04/02/2017   No Known Allergies     Medication List    STOP taking these medications   aspirin EC 81 MG tablet     TAKE these medications   allopurinol 300 MG tablet Commonly known as:  ZYLOPRIM Take 300 mg by mouth daily. For gout   amiodarone 200 MG tablet Commonly known as:  PACERONE Take 1 tablet (200 mg total) by mouth daily.   atorvastatin 40 MG tablet Commonly known as:  LIPITOR Take 40 mg by mouth at bedtime. For lowering cholesterol   carvedilol 12.5 MG tablet Commonly known as:  COREG Take 12.5 mg by mouth every 12 (twelve) hours. For heart   clopidogrel 75 MG tablet Commonly known as:  PLAVIX Take 75 mg by mouth daily. For blood thinning   ELIQUIS 5 MG Tabs tablet Generic drug:  apixaban Take 5 mg by mouth 2 (two) times daily.   feeding supplement (ENSURE ENLIVE) Liqd Take 237 mLs by mouth 2 (two) times daily between meals.   fluticasone 50 MCG/ACT nasal spray Commonly known as:  FLONASE Place 1 spray into both nostrils at bedtime.   furosemide 40 MG tablet Commonly known as:  LASIX Take 1 tablet (40 mg total) by  mouth 2 (two) times daily. What changed:  medication strength   glipiZIDE 5 MG tablet Commonly known as:  GLUCOTROL Take 2.5 mg by mouth daily.   hydrALAZINE 25 MG tablet Commonly known as:  APRESOLINE Take 3 tablets (75 mg total) by mouth every 6 (six) hours. What changed:  medication strength  how much to take   isosorbide dinitrate 30 MG tablet Commonly known as:  ISORDIL Take 1 tablet (30 mg total) by mouth 3 (three) times daily.   latanoprost 0.005 % ophthalmic solution Commonly known as:  XALATAN Place 1 drop into both eyes at bedtime.   nitroGLYCERIN 0.4 MG SL tablet Commonly known as:  NITROSTAT Place 1 tablet (0.4 mg total) under the tongue every 5 (five) minutes as needed for chest pain. Up to 3 doses per episode of chest pain.   pantoprazole 40 MG tablet Commonly known as:  PROTONIX Take 1 tablet (40 mg total) by mouth daily. Start taking on:  04/03/2017   potassium chloride SA 20 MEQ tablet Commonly known as:  K-DUR,KLOR-CON Take 20 mEq by mouth 2 (two) times daily.   tamsulosin 0.4  MG Caps capsule Commonly known as:  FLOMAX Take 0.4 mg by mouth daily.   vitamin B-12 1000 MCG tablet Commonly known as:  CYANOCOBALAMIN Take 1,000 mcg by mouth daily.       No Known Allergies  Consultations:  Cardiology Dr. Ena Dawley  Procedures/Studies: Dg Chest 2 View  Result Date: 03/31/2017 CLINICAL DATA:  Shortness of breath and chest pain EXAM: CHEST  2 VIEW COMPARISON:  Chest radiograph 03/26/2017 FINDINGS: Left chest wall AICD leads are in unchanged position. Cardiomegaly is unchanged. No focal airspace consolidation or pulmonary edema. There is pulmonary vascular congestion. No pleural effusion or pneumothorax. IMPRESSION: Pulmonary vascular congestion without overt pulmonary edema. Unchanged cardiomegaly. Electronically Signed   By: Ulyses Jarred M.D.   On: 03/31/2017 15:54   Dg Chest 2 View  Result Date: 03/26/2017 CLINICAL DATA:  LEFT chest pain and  shortness of breath, history of coronary artery disease post MI and coronary stenting, hypertension, diabetes mellitus, multiple myeloma EXAM: CHEST  2 VIEW COMPARISON:  03/05/2017 FINDINGS: LEFT subclavian transvenous pacemaker/AICD leads project at RIGHT atrium and RIGHT ventricle unchanged since previous exam. Enlargement of cardiac silhouette with pulmonary vascular congestion. Stable mediastinal contours. Chronic bronchitic changes with RIGHT basilar atelectasis. Question small RIGHT pleural effusion No acute infiltrate, LEFT pleural effusion or pneumothorax. Bones demineralized with endplate spur formation thoracic spine. IMPRESSION: Enlargement of cardiac silhouette with pulmonary vascular congestion. Bronchitic changes with RIGHT basilar atelectasis and suspect small RIGHT pleural effusion. No acute infiltrate. Electronically Signed   By: Lavonia Dana M.D.   On: 03/26/2017 17:48   Dg Chest 2 View  Result Date: 03/05/2017 CLINICAL DATA:  Semi productive cough for 3 days now, headache, abd and chest pain as well, having a lot of SOB as well - hx of MI back in December with 2 stents and a defibrillator being placed - hx of multiple myeloma, CHF, htn, diabetes, CAD, kidney disease EXAM: CHEST  2 VIEW COMPARISON:  03/04/2017 FINDINGS: LEFT-sided pacemaker overlies stable cardiac silhouette. There is perihilar linear markings similar prior. No focal consolidation. No pneumothorax. Small effusions. IMPRESSION: 1. Chronic bronchitic markings. 2. Small effusions. 3. No overt pulmonary edema or pneumonia identified. Electronically Signed   By: Suzy Bouchard M.D.   On: 03/05/2017 13:59   Dg Chest 2 View  Result Date: 03/04/2017 CLINICAL DATA:  Left-sided chest pain. EXAM: CHEST  2 VIEW COMPARISON:  02/21/2017 FINDINGS: The heart is borderline enlarged but stable. The mediastinal and hilar contours are within normal limits and unchanged. Mild tortuosity of the thoracic aorta. The pacer wires are stable. The  lungs demonstrate chronic bronchitic changes but no acute infiltrates, edema or effusions. The bony thorax is intact. IMPRESSION: No acute cardiopulmonary findings.  Chronic lung changes. Electronically Signed   By: Marijo Sanes M.D.   On: 03/04/2017 17:17   Ct Angio Chest Pe W Or Wo Contrast  Result Date: 03/31/2017 CLINICAL DATA:  Chest pain and history of congestive heart failure. Shortness of breath. EXAM: CT ANGIOGRAPHY CHEST WITH CONTRAST TECHNIQUE: Multidetector CT imaging of the chest was performed using the standard protocol during bolus administration of intravenous contrast. Multiplanar CT image reconstructions and MIPs were obtained to evaluate the vascular anatomy. CONTRAST:  80 mL Isovue 370 IV COMPARISON:  Chest radiograph 03/31/2017 FINDINGS: Cardiovascular: Contrast injection is sufficient to demonstrate satisfactory opacification of the pulmonary arteries to the segmental level. There is no pulmonary embolus. The main pulmonary artery is mildly enlarged, measuring 3.3 cm at the bifurcation. There is  no CT evidence of acute right heart strain. There is mild atherosclerotic calcification of the aorta. There is a normal variant aortic arch branching pattern with the brachiocephalic and left common carotid arteries sharing a common origin. Heart size is enlarged, with a small pericardial effusion measuring 6 mm in thickness. There are AICD leads and coronary artery stents. Mediastinum/Nodes: No mediastinal, hilar or axillary lymphadenopathy. The visualized thyroid and thoracic esophageal course are unremarkable. Lungs/Pleura: There are large right and small left pleural effusions. There are multifocal ground-glass opacities throughout both lungs in a pattern most suggestive of pulmonary edema. There is bibasilar atelectasis. Upper Abdomen: Contrast bolus timing is not optimized for evaluation of the abdominal organs. There are multiple hypoattenuating lesions scattered throughout the liver,  measuring up to 1.7 cm. The other visualized portions of the upper abdominal organs are normal. Musculoskeletal: There is diffuse osteopenia with a trabeculated appearance of all of the vertebral bodies. Review of the MIP images confirms the above findings. IMPRESSION: 1. No pulmonary embolus. 2. Cardiomegaly, small pericardial effusion, bilateral pleural effusions and multifocal ground-glass opacities, most consistent with congestive heart failure. 3. Mildly enlarged main pulmonary artery, which may be seen in the setting of pulmonary hypertension. 4. Diffuse osteopenia, consistent with multiple myeloma, suggesting diffuse marrow involvement. 5. Aortic atherosclerosis. Electronically Signed   By: Ulyses Jarred M.D.   On: 03/31/2017 17:55   Nm Myocar Multi W/spect W/wall Motion / Ef  Result Date: 03/29/2017 CLINICAL DATA:  Chest pain and history of myocardial infarction. EXAM: MYOCARDIAL IMAGING WITH SPECT (REST AND PHARMACOLOGIC-STRESS) GATED LEFT VENTRICULAR WALL MOTION STUDY LEFT VENTRICULAR EJECTION FRACTION TECHNIQUE: Standard myocardial SPECT imaging was performed after resting intravenous injection of 10 mCi Tc-75mtetrofosmin. Subsequently, intravenous infusion of Lexiscan was performed under the supervision of the Cardiology staff. At peak effect of the drug, 30 mCi Tc-928metrofosmin was injected intravenously and standard myocardial SPECT imaging was performed. Quantitative gated imaging was also performed to evaluate left ventricular wall motion, and estimate left ventricular ejection fraction. COMPARISON:  None. FINDINGS: Perfusion: There is a medium sized fixed defect involving the mid inferior wall. Wall Motion: Wall motion abnormality with hypokinesis of the inferior and lateral walls. Left Ventricular Ejection Fraction: 38 % End diastolic volume 20188l End systolic volume 12416l IMPRESSION: 1. Single medium sized fixed defect involving the mid inferior wall. No reversibility. 2. Inferolateral  wall hypokinesis. 3. Left ventricular ejection fraction 38% 4. Non invasive risk stratification*: Low *2012 Appropriate Use Criteria for Coronary Revascularization Focused Update: J Am Coll Cardiol. 206063;01(6):[email or web address] Electronically Signed   By: TaKerby Moors.D.   On: 03/29/2017 13:30   Subjective: Seen and examined and was doing well. Felt better and had no reoccurrence of hemoptysis. No nausea or vomiting. Ambulated well without O2. Ready to go home.   Discharge Exam: Vitals:   04/02/17 0430 04/02/17 1411  BP: 132/65 (!) 128/59  Pulse: 61 (!) 57  Resp: 18 18  Temp: 99.1 F (37.3 C) 98.4 F (36.9 C)   Vitals:   04/01/17 2131 04/02/17 0430 04/02/17 1411 04/02/17 1500  BP: (!) 142/69 132/65 (!) 128/59   Pulse: 60 61 (!) 57   Resp: _0 Temp: 98.5 F (36.9 C) 99.1 F (37.3 C) 98.4 F (36.9 C)   TempSrc: Oral Oral Oral   SpO2: 100% 95% 96% 90%  Weight:  86.1 kg (189 lb 13.1 oz)    Height:       General: Pt is  alert, awake, not in acute distress Cardiovascular: RRR, S1/S2 +, no rubs, no gallops Respiratory: CTA bilaterally, no wheezing, no rhonchi Abdominal: Soft, NT, ND, bowel sounds + Extremities: no edema, no cyanosis  The results of significant diagnostics from this hospitalization (including imaging, microbiology, ancillary and laboratory) are listed below for reference.    Microbiology: No results found for this or any previous visit (from the past 240 hour(s)).   Labs: BNP (last 3 results)  Recent Labs  02/22/17 0423 03/26/17 2216 03/31/17 1554  BNP 1,024.3* 1,148.0* 1,610.9*   Basic Metabolic Panel:  Recent Labs Lab 03/29/17 0235 03/30/17 0419 03/31/17 1554 04/01/17 0459 04/02/17 0440  NA 141 139 140 143 141  K 3.5 3.4* 3.7 3.1* 3.5  CL 106 107 110 109 107  CO2 _0 GLUCOSE 92 92 112* 97 97  BUN 24* 20 24* 19 23*  CREATININE 2.16* 1.83* 1.55* 1.44* 1.58*  CALCIUM 8.4*  8.0* 8.4* 8.4* 8.3*  MG  --   --   --   --  1.9  PHOS  --   --   --   --  3.2   Liver Function Tests:  Recent Labs Lab 03/31/17 1554 04/02/17 0440  AST 27 19  ALT 52 44  ALKPHOS 55 47  BILITOT 0.4 0.5  PROT 6.5 5.7*  ALBUMIN 3.3* 2.9*    Recent Labs Lab 03/31/17 1554  LIPASE 13   No results for input(s): AMMONIA in the last 168 hours. CBC:  Recent Labs Lab 03/28/17 0448 03/30/17 0419 03/31/17 1554 04/01/17 0956 04/02/17 0440  WBC 5.4 4.9 6.7 4.8 6.5  NEUTROABS  --   --   --  3.3 4.6  HGB 8.8* 8.1* 8.8* 8.6* 8.6*  HCT 27.5* 25.4* 27.0* 27.4* 25.9*  MCV 84.4 81.9 82.8 82.5 84.6  PLT 147* 149* 158 149* 157   Cardiac Enzymes:  Recent Labs Lab 03/26/17 2216 03/27/17 0327 03/27/17 1026 03/31/17 1952 04/01/17 0459  TROPONINI 0.03* 0.03* <0.03 0.03* 0.03*   BNP: Invalid input(s): POCBNP CBG:  Recent Labs Lab 04/01/17 1159 04/01/17 1734 04/01/17 2128 04/02/17 0731 04/02/17 1205  GLUCAP 145* 138* 83 94 212*   D-Dimer No results for input(s): DDIMER in the last 72 hours. Hgb A1c No results for input(s): HGBA1C in the last 72 hours. Lipid Profile No results for input(s): CHOL, HDL, LDLCALC, TRIG, CHOLHDL, LDLDIRECT in the last 72 hours. Thyroid function studies No results for input(s): TSH, T4TOTAL, T3FREE, THYROIDAB in the last 72 hours.  Invalid input(s): FREET3 Anemia work up No results for input(s): VITAMINB12, FOLATE, FERRITIN, TIBC, IRON, RETICCTPCT in the last 72 hours. Urinalysis No results found for: COLORURINE, APPEARANCEUR, LABSPEC, [place], GLUCOSEU, HGBUR, BILIRUBINUR, KETONESUR, PROTEINUR, UROBILINOGEN, NITRITE, LEUKOCYTESUR Sepsis Labs Invalid input(s): PROCALCITONIN,  WBC,  LACTICIDVEN Microbiology No results found for this or any previous visit (from the past 240 hour(s)).  Time coordinating discharge: 35 minutes  SIGNED:  Kerney Elbe, DO [facility] 04/02/2017, 3:17 PM Pager [phone]  If 7PM-7AM, please  contact night-coverage [email or web address] Password [email or web address]

## 2017-04-02 NOTE — Progress Notes (Signed)
SATURATION QUALIFICATIONS: (This note is used to comply with regulatory documentation for home oxygen)  Patient Saturations on Room Air at Rest = 95%  Patient Saturations on Room Air while Ambulating = 90-95%  Patient Saturations on 0 Liters of oxygen while Ambulating = n/a  Please briefly explain why patient needs home oxygen: pt does not meet criteria

## 2017-04-03 ENCOUNTER — Emergency Department (HOSPITAL_COMMUNITY)
Admission: EM | Admit: 2017-04-03 | Discharge: 2017-04-03 | Disposition: A | Discharge status: Home / Self Care | Payer: Medicare Other | Source: Home / Self Care | Attending: Emergency Medicine | Admitting: Emergency Medicine

## 2017-04-03 ENCOUNTER — Emergency Department (HOSPITAL_COMMUNITY): Payer: Medicare Other

## 2017-04-03 ENCOUNTER — Encounter (HOSPITAL_COMMUNITY): Payer: Self-pay | Admitting: Emergency Medicine

## 2017-04-03 DIAGNOSIS — Z8546 Personal history of malignant neoplasm of prostate: Secondary | ICD-10-CM | POA: Insufficient documentation

## 2017-04-03 DIAGNOSIS — Z7984 Long term (current) use of oral hypoglycemic drugs: Secondary | ICD-10-CM

## 2017-04-03 DIAGNOSIS — Z95810 Presence of automatic (implantable) cardiac defibrillator: Secondary | ICD-10-CM

## 2017-04-03 DIAGNOSIS — I130 Hypertensive heart and chronic kidney disease with heart failure and stage 1 through stage 4 chronic kidney disease, or unspecified chronic kidney disease: Secondary | ICD-10-CM

## 2017-04-03 DIAGNOSIS — Z85828 Personal history of other malignant neoplasm of skin: Secondary | ICD-10-CM | POA: Insufficient documentation

## 2017-04-03 DIAGNOSIS — I2510 Atherosclerotic heart disease of native coronary artery without angina pectoris: Secondary | ICD-10-CM

## 2017-04-03 DIAGNOSIS — J189 Pneumonia, unspecified organism: Secondary | ICD-10-CM | POA: Diagnosis not present

## 2017-04-03 DIAGNOSIS — I252 Old myocardial infarction: Secondary | ICD-10-CM

## 2017-04-03 DIAGNOSIS — R0789 Other chest pain: Secondary | ICD-10-CM | POA: Diagnosis not present

## 2017-04-03 DIAGNOSIS — Z955 Presence of coronary angioplasty implant and graft: Secondary | ICD-10-CM | POA: Insufficient documentation

## 2017-04-03 DIAGNOSIS — N183 Chronic kidney disease, stage 3 (moderate): Secondary | ICD-10-CM | POA: Insufficient documentation

## 2017-04-03 DIAGNOSIS — E1122 Type 2 diabetes mellitus with diabetic chronic kidney disease: Secondary | ICD-10-CM

## 2017-04-03 DIAGNOSIS — Z7901 Long term (current) use of anticoagulants: Secondary | ICD-10-CM | POA: Insufficient documentation

## 2017-04-03 DIAGNOSIS — I5042 Chronic combined systolic (congestive) and diastolic (congestive) heart failure: Secondary | ICD-10-CM

## 2017-04-03 LAB — BASIC METABOLIC PANEL
ANION GAP: 10 (ref 5–15)
BUN: 23 mg/dL — AB (ref 6–20)
CHLORIDE: 102 mmol/L (ref 101–111)
CO2: 26 mmol/L (ref 22–32)
Calcium: 8.5 mg/dL — ABNORMAL LOW (ref 8.9–10.3)
Creatinine, Ser: 1.77 mg/dL — ABNORMAL HIGH (ref 0.61–1.24)
GFR calc Af Amer: 39 mL/min — ABNORMAL LOW (ref >60)
GFR, EST NON AFRICAN AMERICAN: 34 mL/min — AB (ref >60)
GLUCOSE: 84 mg/dL (ref 65–99)
POTASSIUM: 3.5 mmol/L (ref 3.5–5.1)
Sodium: 138 mmol/L (ref 135–145)

## 2017-04-03 LAB — CBC WITH DIFFERENTIAL/PLATELET
BASOS ABS: 0 10*3/uL (ref 0.0–0.1)
Basophils Relative: 0 %
EOS PCT: 0 %
Eosinophils Absolute: 0 10*3/uL (ref 0.0–0.7)
HCT: 29 % — ABNORMAL LOW (ref 39.0–52.0)
Hemoglobin: 9.8 g/dL — ABNORMAL LOW (ref 13.0–17.0)
LYMPHS ABS: 0.5 10*3/uL — AB (ref 0.7–4.0)
LYMPHS PCT: 7 %
MCH: 28.7 pg (ref 26.0–34.0)
MCHC: 33.8 g/dL (ref 30.0–36.0)
MCV: 85 fL (ref 78.0–100.0)
MONO ABS: 0.6 10*3/uL (ref 0.1–1.0)
Monocytes Relative: 8 %
NEUTROS ABS: 6.4 10*3/uL (ref 1.7–7.7)
Neutrophils Relative %: 85 %
PLATELETS: 180 10*3/uL (ref 150–400)
RBC: 3.41 MIL/uL — ABNORMAL LOW (ref 4.22–5.81)
RDW: 18.1 % — AB (ref 11.5–15.5)
WBC: 7.5 10*3/uL (ref 4.0–10.5)

## 2017-04-03 LAB — I-STAT TROPONIN, ED: Troponin i, poc: 0.04 ng/mL (ref 0.00–0.08)

## 2017-04-03 LAB — BRAIN NATRIURETIC PEPTIDE: B Natriuretic Peptide: 639.5 pg/mL — ABNORMAL HIGH (ref 0.0–100.0)

## 2017-04-03 MED ORDER — FUROSEMIDE 10 MG/ML PO SOLN
40.0000 mg | Freq: Once | ORAL | Status: AC
  Administered 2017-04-03: 40 mg via ORAL
  Filled 2017-04-03: qty 5

## 2017-04-03 NOTE — ED Triage Notes (Signed)
Pt reports ongoing CP since last ED visit. Wants O2 tank.  Pt also reports CP since this am.

## 2017-04-03 NOTE — ED Notes (Signed)
Unsuccessful IV attempt x2. Katie RN asked to attempt. 

## 2017-04-03 NOTE — ED Provider Notes (Signed)
[facility] DEPT Provider Note   CSN: [id number] Arrival date & time: 04/03/17  1129     History   Chief Complaint Chief Complaint  Patient presents with  . Shortness of Breath    HPI David Guerra is a 83 y.o. male.  HPI Patient presents one day after discharge from the hospital for congestive heart failure. He reports that he has trouble breathing again. Symptoms are the same. He reports he also has chest tightness. He reports he wants to have some home oxygen. Patient is equivocal on whether or not he has taken his medications since discharge. Patient has a large bottle of ice water at bedside which he reports belongs  to his granddaughter who dropped him off. There were no visitors at bedside during the course of my observation of the patient in the emergency department. Past Medical History:  Diagnosis Date  . Acute on chronic systolic and diastolic heart failure, NYHA class 4 ([place]) 02/21/2017  . AICD (automatic cardioverter/defibrillator) present    [employer]  . Anemia   . Cancer ([place])    mulitple myeloma  . Chronic systolic CHF (congestive heart failure), NYHA class 3 ([place])   . CKD (chronic kidney disease), stage III   . Coronary artery disease   . Diabetes mellitus without complication ([place])   . Hypertension   . Multiple myeloma ([place])    "the doctor says it's resting"  . Myocardial infarction ([place])   . Prostate cancer ([place])   . PUD (peptic ulcer disease)     Patient Active Problem List   Diagnosis Date Noted  . History of myocardial infarction 04/01/2017  . Acute on chronic systolic CHF (congestive heart failure) ([place]) 04/01/2017  . Normocytic anemia 03/27/2017  . Acute bronchitis 03/05/2017  . Chronic combined systolic and diastolic CHF (congestive heart failure) ([place]) 03/05/2017  . CKD (chronic kidney disease), stage III   . Chest pain 02/21/2017  . Acute on chronic systolic and diastolic heart failure, NYHA class 4 ([place]) 02/21/2017  . Essential  hypertension 02/21/2017  . AICD (automatic cardioverter/defibrillator) present 02/21/2017  . CAD S/P percutaneous coronary angioplasty 02/21/2017  . Type 2 diabetes mellitus with stage 3 chronic kidney disease, without long-term current use of insulin ([place]) 02/21/2017    Past Surgical History:  Procedure Laterality Date  . APPENDECTOMY    . BACK SURGERY  1999  . CARDIAC DEFIBRILLATOR PLACEMENT  11/2016   [employer]  . CARDIAC DEFIBRILLATOR PLACEMENT    . CORONARY ANGIOPLASTY WITH STENT PLACEMENT  11/2016   total of 2 stents, 1 in January, 1 in February, both in [facility] in [place], [place] Medications    Prior to Admission medications   Medication Sig Start Date End Date Taking? Authorizing Provider  allopurinol (ZYLOPRIM) 300 MG tablet Take 300 mg by mouth daily. For gout 01/16/17   [provider]  amiodarone (PACERONE) 200 MG tablet Take 1 tablet (200 mg total) by mouth daily. 02/25/17   Reyne Dumas, MD  atorvastatin (LIPITOR) 40 MG tablet Take 40 mg by mouth at bedtime. For lowering cholesterol 01/16/17   [provider]  carvedilol (COREG) 12.5 MG tablet Take 12.5 mg by mouth every 12 (twelve) hours. For heart 01/16/17   [provider]  clopidogrel (PLAVIX) 75 MG tablet Take 75 mg by mouth daily. For blood thinning 01/29/17   [provider]  ELIQUIS 5 MG TABS tablet Take  5 mg by mouth 2 (two) times daily. 03/18/17   [provider]  feeding supplement, ENSURE ENLIVE, (ENSURE ENLIVE) LIQD Take 237 mLs by mouth 2 (two) times daily between meals. 04/02/17   Sheikh, Omair Latif, DO  fluticasone (FLONASE) 50 MCG/ACT nasal spray Place 1 spray into both nostrils at bedtime. 01/29/17   [provider]  furosemide (LASIX) 40 MG tablet Take 1 tablet (40 mg total) by mouth 2 (two) times daily. 04/02/17   Sheikh, Omair Latif, DO  glipiZIDE (GLUCOTROL) 5 MG tablet Take 2.5 mg by mouth  daily. 03/25/17   [provider]  hydrALAZINE (APRESOLINE) 25 MG tablet Take 3 tablets (75 mg total) by mouth every 6 (six) hours. 04/02/17   Raiford Noble Latif, DO  isosorbide dinitrate (ISORDIL) 30 MG tablet Take 1 tablet (30 mg total) by mouth 3 (three) times daily. 03/30/17 04/29/17  Hongalgi, Lenis Dickinson, MD  latanoprost (XALATAN) 0.005 % ophthalmic solution Place 1 drop into both eyes at bedtime.    [provider]  nitroGLYCERIN (NITROSTAT) 0.4 MG SL tablet Place 1 tablet (0.4 mg total) under the tongue every 5 (five) minutes as needed for chest pain. Up to 3 doses per episode of chest pain. 03/30/17   Hongalgi, Lenis Dickinson, MD  pantoprazole (PROTONIX) 40 MG tablet Take 1 tablet (40 mg total) by mouth daily. 04/03/17   Raiford Noble Latif, DO  potassium chloride SA (K-DUR,KLOR-CON) 20 MEQ tablet Take 20 mEq by mouth 2 (two) times daily.    [provider]  tamsulosin (FLOMAX) 0.4 MG CAPS capsule Take 0.4 mg by mouth daily. 01/16/17   [provider]  vitamin B-12 (CYANOCOBALAMIN) 1000 MCG tablet Take 1,000 mcg by mouth daily.    [provider]    Family History Family History  Problem Relation Age of Onset  . Dementia Mother   . Cancer Father   . Prostate cancer Brother   . Prostate cancer Brother     Social History Social History  Substance Use Topics  . Smoking status: Never Smoker  . Smokeless tobacco: Never Used  . Alcohol use Yes     Comment: occasionally     Allergies   Patient has no known allergies.   Review of Systems Review of Systems 10 Systems reviewed and are negative for acute change except as noted in the HPI.  Physical Exam Updated Vital Signs BP (!) 148/65   Pulse 60   Temp 99.1 F (37.3 C) (Oral)   Resp 16   Ht 5' 8"  (1.727 m)   Wt 190 lb 11.2 oz (86.5 kg)   SpO2 93%   BMI 29.00 kg/m   Physical Exam  Constitutional: He appears well-developed and well-nourished.  As I enter the room patient, patient begins  breathing quickly and reports shortness of breath. However as I conduct interview and sit in the room with the patient his breathing is nonlabored and he is speaking easily in full sentences.  HENT:  Head: Normocephalic and atraumatic.  Mouth/Throat: Oropharynx is clear and moist.  Eyes: Conjunctivae and EOM are normal.  Neck: Neck supple.  Cardiovascular: Normal rate and regular rhythm.   No murmur heard. Pulmonary/Chest: Effort normal. No respiratory distress.  Adequate air flow to the bases. Rare expiratory wheeze. No gross rail.  Abdominal: Soft. There is no tenderness.  Musculoskeletal: Normal range of motion. He exhibits no tenderness.  Trace edema at ankles.  Neurological: He is alert.  Skin: Skin is warm and dry.  Psychiatric:  He has a normal mood and affect.  Nursing note and vitals reviewed.    ED Treatments / Results  Labs (all labs ordered are listed, but only abnormal results are displayed) Labs Reviewed  BASIC METABOLIC PANEL - Abnormal; Notable for the following:       Result Value   BUN 23 (*)    Creatinine, Ser 1.77 (*)    Calcium 8.5 (*)    GFR calc non Af Amer 34 (*)    GFR calc Af Amer 39 (*)    All other components within normal limits  CBC WITH DIFFERENTIAL/PLATELET - Abnormal; Notable for the following:    RBC 3.41 (*)    Hemoglobin 9.8 (*)    HCT 29.0 (*)    RDW 18.1 (*)    Lymphs Abs 0.5 (*)    All other components within normal limits  BRAIN NATRIURETIC PEPTIDE - Abnormal; Notable for the following:    B Natriuretic Peptide 639.5 (*)    All other components within normal limits  I-STAT TROPOININ, ED    EKG  EKG Interpretation None       Radiology Dg Chest 2 View  Result Date: 04/03/2017 CLINICAL DATA:  Dyspnea beginning this morning. EXAM: CHEST  2 VIEW COMPARISON:  PA and lateral chest and CT chest 03/31/2017. FINDINGS: Bilateral effusions and airspace disease appear with worse than on the prior plain films. There is cardiomegaly.  Pacing device is in place. IMPRESSION: Increased bilateral effusions and airspace disease could be due to asymmetric pulmonary edema and/or pneumonia. Edema is favored. Electronically Signed   By: Inge Rise M.D.   On: 04/03/2017 13:13    Procedures Procedures (including critical care time)  Medications Ordered in ED Medications  furosemide (LASIX) 10 MG/ML solution 40 mg (40 mg Oral Given 04/03/17 1357)     Initial Impression / Assessment and Plan / ED Course  I have reviewed the triage vital signs and the nursing notes.  Pertinent labs & imaging results that were available during my care of the patient were reviewed by me and considered in my medical decision making (see chart for details).     Final Clinical Impressions(s) / ED Diagnoses   Final diagnoses:  Chronic combined systolic and diastolic congestive heart failure ([place])  At time of presentation, patient does not show signs of significant respiratory distress. BNP is at half of what previous measured during hospitalization. I question compliance as the patient does have a large water bedside although he states he does not belong to him and he is somewhat equivocal on medications. The patient is given 40 mg of Lasix IV. Clinically he appears stable without evidence of acute exacerbation of chronic congestive heart failure. At this time, I do not feel that he needs readmission patient is counseled on discussing any home O2 needs with his primary provider as we cannot provide oxygen tank through the emergency department. Patient is not hypoxic at this time. I feel he is stable for close follow-up with his outpatient providers and compliance with outpatient medications.  New Prescriptions New Prescriptions   No medications on file     Charlesetta Shanks, MD 04/08/17 1609

## 2017-04-03 NOTE — Discharge Instructions (Signed)
Continue taking your Lasix as prescribed. Take all the prescribed medications were given at discharge yesterday and schedule follow-up appointments as per discharge instructions from yesterday.

## 2017-04-03 NOTE — ED Notes (Signed)
Patient transported to X-ray 

## 2017-04-03 NOTE — ED Notes (Signed)
RN starting line and collecting blood work 

## 2017-04-05 ENCOUNTER — Inpatient Hospital Stay (HOSPITAL_COMMUNITY)
Admission: EM | Admit: 2017-04-05 | Discharge: 2017-04-09 | DRG: 193 | Disposition: A | Discharge status: Home / Self Care | Payer: Medicare Other | Attending: Internal Medicine | Admitting: Internal Medicine

## 2017-04-05 ENCOUNTER — Emergency Department (HOSPITAL_COMMUNITY): Payer: Medicare Other

## 2017-04-05 ENCOUNTER — Encounter (HOSPITAL_COMMUNITY): Payer: Self-pay | Admitting: Emergency Medicine

## 2017-04-05 DIAGNOSIS — N400 Enlarged prostate without lower urinary tract symptoms: Secondary | ICD-10-CM | POA: Diagnosis present

## 2017-04-05 DIAGNOSIS — Y95 Nosocomial condition: Secondary | ICD-10-CM | POA: Diagnosis present

## 2017-04-05 DIAGNOSIS — J9601 Acute respiratory failure with hypoxia: Secondary | ICD-10-CM | POA: Diagnosis present

## 2017-04-05 DIAGNOSIS — R0602 Shortness of breath: Secondary | ICD-10-CM

## 2017-04-05 DIAGNOSIS — Z8546 Personal history of malignant neoplasm of prostate: Secondary | ICD-10-CM

## 2017-04-05 DIAGNOSIS — I2510 Atherosclerotic heart disease of native coronary artery without angina pectoris: Secondary | ICD-10-CM | POA: Diagnosis present

## 2017-04-05 DIAGNOSIS — R0789 Other chest pain: Secondary | ICD-10-CM

## 2017-04-05 DIAGNOSIS — IMO0002 Reserved for concepts with insufficient information to code with codable children: Secondary | ICD-10-CM

## 2017-04-05 DIAGNOSIS — M109 Gout, unspecified: Secondary | ICD-10-CM | POA: Diagnosis present

## 2017-04-05 DIAGNOSIS — Z79899 Other long term (current) drug therapy: Secondary | ICD-10-CM

## 2017-04-05 DIAGNOSIS — E876 Hypokalemia: Secondary | ICD-10-CM | POA: Diagnosis present

## 2017-04-05 DIAGNOSIS — C9000 Multiple myeloma not having achieved remission: Secondary | ICD-10-CM | POA: Diagnosis present

## 2017-04-05 DIAGNOSIS — Z86718 Personal history of other venous thrombosis and embolism: Secondary | ICD-10-CM

## 2017-04-05 DIAGNOSIS — Z955 Presence of coronary angioplasty implant and graft: Secondary | ICD-10-CM

## 2017-04-05 DIAGNOSIS — J189 Pneumonia, unspecified organism: Principal | ICD-10-CM | POA: Diagnosis present

## 2017-04-05 DIAGNOSIS — I10 Essential (primary) hypertension: Secondary | ICD-10-CM | POA: Diagnosis present

## 2017-04-05 DIAGNOSIS — I130 Hypertensive heart and chronic kidney disease with heart failure and stage 1 through stage 4 chronic kidney disease, or unspecified chronic kidney disease: Secondary | ICD-10-CM | POA: Diagnosis present

## 2017-04-05 DIAGNOSIS — N183 Chronic kidney disease, stage 3 unspecified: Secondary | ICD-10-CM | POA: Diagnosis present

## 2017-04-05 DIAGNOSIS — I252 Old myocardial infarction: Secondary | ICD-10-CM

## 2017-04-05 DIAGNOSIS — Z7901 Long term (current) use of anticoagulants: Secondary | ICD-10-CM

## 2017-04-05 DIAGNOSIS — Z9861 Coronary angioplasty status: Secondary | ICD-10-CM

## 2017-04-05 DIAGNOSIS — D638 Anemia in other chronic diseases classified elsewhere: Secondary | ICD-10-CM | POA: Diagnosis present

## 2017-04-05 DIAGNOSIS — R7989 Other specified abnormal findings of blood chemistry: Secondary | ICD-10-CM

## 2017-04-05 DIAGNOSIS — I255 Ischemic cardiomyopathy: Secondary | ICD-10-CM | POA: Diagnosis present

## 2017-04-05 DIAGNOSIS — I5043 Acute on chronic combined systolic (congestive) and diastolic (congestive) heart failure: Secondary | ICD-10-CM | POA: Diagnosis present

## 2017-04-05 DIAGNOSIS — E1121 Type 2 diabetes mellitus with diabetic nephropathy: Secondary | ICD-10-CM | POA: Diagnosis present

## 2017-04-05 DIAGNOSIS — Z792 Long term (current) use of antibiotics: Secondary | ICD-10-CM

## 2017-04-05 DIAGNOSIS — Z95810 Presence of automatic (implantable) cardiac defibrillator: Secondary | ICD-10-CM

## 2017-04-05 DIAGNOSIS — R079 Chest pain, unspecified: Secondary | ICD-10-CM | POA: Diagnosis present

## 2017-04-05 DIAGNOSIS — M810 Age-related osteoporosis without current pathological fracture: Secondary | ICD-10-CM | POA: Diagnosis present

## 2017-04-05 DIAGNOSIS — R0902 Hypoxemia: Secondary | ICD-10-CM

## 2017-04-05 DIAGNOSIS — E1122 Type 2 diabetes mellitus with diabetic chronic kidney disease: Secondary | ICD-10-CM | POA: Diagnosis present

## 2017-04-05 LAB — CBC
HEMATOCRIT: 27.8 % — AB (ref 39.0–52.0)
Hemoglobin: 9.3 g/dL — ABNORMAL LOW (ref 13.0–17.0)
MCH: 26.5 pg (ref 26.0–34.0)
MCHC: 33.5 g/dL (ref 30.0–36.0)
MCV: 79.2 fL (ref 78.0–100.0)
Platelets: 167 10*3/uL (ref 150–400)
RBC: 3.51 MIL/uL — AB (ref 4.22–5.81)
RDW: 17.8 % — AB (ref 11.5–15.5)
WBC: 9.6 10*3/uL (ref 4.0–10.5)

## 2017-04-05 LAB — TROPONIN I
TROPONIN I: 0.05 ng/mL — AB (ref <0.03)
TROPONIN I: 0.05 ng/mL — AB (ref <0.03)
Troponin I: 0.05 ng/mL (ref <0.03)
Troponin I: 0.04 ng/mL (ref <0.03)

## 2017-04-05 LAB — BRAIN NATRIURETIC PEPTIDE: B NATRIURETIC PEPTIDE 5: 644.2 pg/mL — AB (ref 0.0–100.0)

## 2017-04-05 LAB — BLOOD GAS, ARTERIAL
Acid-Base Excess: 2.9 mmol/L — ABNORMAL HIGH (ref 0.0–2.0)
Bicarbonate: 25.6 mmol/L (ref 20.0–28.0)
DRAWN BY: 422461
FIO2: 21
O2 Saturation: 89.4 %
PCO2 ART: 32 mmHg (ref 32.0–48.0)
PH ART: 7.512 — AB (ref 7.350–7.450)
Patient temperature: 98.2
pO2, Arterial: 56.8 mmHg — ABNORMAL LOW (ref 83.0–108.0)

## 2017-04-05 LAB — BASIC METABOLIC PANEL
ANION GAP: 9 (ref 5–15)
BUN: 29 mg/dL — AB (ref 6–20)
CHLORIDE: 101 mmol/L (ref 101–111)
CO2: 24 mmol/L (ref 22–32)
Calcium: 8.5 mg/dL — ABNORMAL LOW (ref 8.9–10.3)
Creatinine, Ser: 2.16 mg/dL — ABNORMAL HIGH (ref 0.61–1.24)
GFR, EST AFRICAN AMERICAN: 31 mL/min — AB (ref >60)
GFR, EST NON AFRICAN AMERICAN: 27 mL/min — AB (ref >60)
Glucose, Bld: 74 mg/dL (ref 65–99)
POTASSIUM: 3.2 mmol/L — AB (ref 3.5–5.1)
SODIUM: 134 mmol/L — AB (ref 135–145)

## 2017-04-05 LAB — GLUCOSE, CAPILLARY
GLUCOSE-CAPILLARY: 60 mg/dL — AB (ref 65–99)
GLUCOSE-CAPILLARY: 96 mg/dL (ref 65–99)
GLUCOSE-CAPILLARY: 95 mg/dL (ref 65–99)
GLUCOSE-CAPILLARY: 87 mg/dL (ref 65–99)
Glucose-Capillary: 56 mg/dL — ABNORMAL LOW (ref 65–99)
Glucose-Capillary: 99 mg/dL (ref 65–99)

## 2017-04-05 LAB — LACTIC ACID, PLASMA: LACTIC ACID, VENOUS: 0.9 mmol/L (ref 0.5–1.9)

## 2017-04-05 LAB — I-STAT TROPONIN, ED: Troponin i, poc: 0.1 ng/mL (ref 0.00–0.08)

## 2017-04-05 LAB — TSH: TSH: 2.269 u[IU]/mL (ref 0.350–4.500)

## 2017-04-05 MED ORDER — VITAMIN B-12 1000 MCG PO TABS
1000.0000 ug | ORAL_TABLET | Freq: Every day | ORAL | Status: DC
  Administered 2017-04-05 – 2017-04-09 (×5): 1000 ug via ORAL
  Filled 2017-04-05 (×5): qty 1

## 2017-04-05 MED ORDER — TAMSULOSIN HCL 0.4 MG PO CAPS
0.4000 mg | ORAL_CAPSULE | Freq: Every day | ORAL | Status: DC
  Administered 2017-04-05 – 2017-04-09 (×5): 0.4 mg via ORAL
  Filled 2017-04-05 (×5): qty 1

## 2017-04-05 MED ORDER — ISOSORBIDE DINITRATE 20 MG PO TABS
30.0000 mg | ORAL_TABLET | Freq: Three times a day (TID) | ORAL | Status: DC
  Administered 2017-04-05 – 2017-04-09 (×13): 30 mg via ORAL
  Filled 2017-04-05 (×15): qty 1

## 2017-04-05 MED ORDER — ENSURE ENLIVE PO LIQD
237.0000 mL | Freq: Two times a day (BID) | ORAL | Status: DC
  Administered 2017-04-06 – 2017-04-09 (×7): 237 mL via ORAL

## 2017-04-05 MED ORDER — LATANOPROST 0.005 % OP SOLN
1.0000 [drp] | Freq: Every day | OPHTHALMIC | Status: DC
Start: 2017-04-05 — End: 2017-04-09
  Administered 2017-04-05 – 2017-04-08 (×4): 1 [drp] via OPHTHALMIC
  Filled 2017-04-05: qty 2.5

## 2017-04-05 MED ORDER — FUROSEMIDE 10 MG/ML IJ SOLN
40.0000 mg | Freq: Once | INTRAMUSCULAR | Status: AC
  Administered 2017-04-05: 40 mg via INTRAVENOUS
  Filled 2017-04-05: qty 4

## 2017-04-05 MED ORDER — NITROGLYCERIN 0.4 MG SL SUBL
0.4000 mg | SUBLINGUAL_TABLET | SUBLINGUAL | Status: AC | PRN
  Administered 2017-04-05 (×3): 0.4 mg via SUBLINGUAL
  Filled 2017-04-05: qty 1

## 2017-04-05 MED ORDER — INSULIN ASPART 100 UNIT/ML ~~LOC~~ SOLN
0.0000 [IU] | Freq: Three times a day (TID) | SUBCUTANEOUS | Status: DC
  Administered 2017-04-07: 3 [IU] via SUBCUTANEOUS
  Administered 2017-04-07: 2 [IU] via SUBCUTANEOUS
  Administered 2017-04-08 (×2): 1 [IU] via SUBCUTANEOUS

## 2017-04-05 MED ORDER — HYDRALAZINE HCL 50 MG PO TABS
75.0000 mg | ORAL_TABLET | Freq: Four times a day (QID) | ORAL | Status: DC
  Administered 2017-04-05 – 2017-04-08 (×13): 75 mg via ORAL
  Filled 2017-04-05 (×13): qty 1

## 2017-04-05 MED ORDER — AMIODARONE HCL 200 MG PO TABS
200.0000 mg | ORAL_TABLET | Freq: Every day | ORAL | Status: DC
  Administered 2017-04-05 – 2017-04-08 (×4): 200 mg via ORAL
  Filled 2017-04-05 (×4): qty 1

## 2017-04-05 MED ORDER — CLOPIDOGREL BISULFATE 75 MG PO TABS
75.0000 mg | ORAL_TABLET | Freq: Every day | ORAL | Status: DC
  Administered 2017-04-05 – 2017-04-09 (×5): 75 mg via ORAL
  Filled 2017-04-05 (×5): qty 1

## 2017-04-05 MED ORDER — ALLOPURINOL 300 MG PO TABS
300.0000 mg | ORAL_TABLET | Freq: Every day | ORAL | Status: DC
  Administered 2017-04-05 – 2017-04-09 (×5): 300 mg via ORAL
  Filled 2017-04-05 (×5): qty 1

## 2017-04-05 MED ORDER — POTASSIUM CHLORIDE CRYS ER 20 MEQ PO TBCR
20.0000 meq | EXTENDED_RELEASE_TABLET | Freq: Two times a day (BID) | ORAL | Status: DC
  Administered 2017-04-05 – 2017-04-09 (×9): 20 meq via ORAL
  Filled 2017-04-05 (×9): qty 1

## 2017-04-05 MED ORDER — ALUM & MAG HYDROXIDE-SIMETH 200-200-20 MG/5ML PO SUSP
15.0000 mL | Freq: Once | ORAL | Status: AC
  Administered 2017-04-05: 15 mL via ORAL
  Filled 2017-04-05: qty 30

## 2017-04-05 MED ORDER — FUROSEMIDE 10 MG/ML IJ SOLN
INTRAMUSCULAR | Status: AC
  Administered 2017-04-05: 40 mg via INTRAVENOUS
  Filled 2017-04-05: qty 4

## 2017-04-05 MED ORDER — CARVEDILOL 12.5 MG PO TABS
12.5000 mg | ORAL_TABLET | Freq: Two times a day (BID) | ORAL | Status: DC
  Administered 2017-04-05 – 2017-04-09 (×9): 12.5 mg via ORAL
  Filled 2017-04-05 (×10): qty 1

## 2017-04-05 MED ORDER — FUROSEMIDE 20 MG PO TABS
20.0000 mg | ORAL_TABLET | Freq: Two times a day (BID) | ORAL | Status: DC
  Administered 2017-04-06 – 2017-04-08 (×5): 20 mg via ORAL
  Filled 2017-04-05 (×5): qty 1

## 2017-04-05 MED ORDER — ATORVASTATIN CALCIUM 40 MG PO TABS
40.0000 mg | ORAL_TABLET | Freq: Every day | ORAL | Status: DC
  Administered 2017-04-05 – 2017-04-08 (×4): 40 mg via ORAL
  Filled 2017-04-05 (×4): qty 1

## 2017-04-05 MED ORDER — FUROSEMIDE 40 MG PO TABS: 40.0000 mg | ORAL_TABLET | Freq: Two times a day (BID) | ORAL | Status: DC

## 2017-04-05 MED ORDER — APIXABAN 5 MG PO TABS
5.0000 mg | ORAL_TABLET | Freq: Two times a day (BID) | ORAL | Status: DC
  Administered 2017-04-05 – 2017-04-09 (×9): 5 mg via ORAL
  Filled 2017-04-05 (×9): qty 1

## 2017-04-05 MED ORDER — ACETAMINOPHEN 325 MG PO TABS
650.0000 mg | ORAL_TABLET | Freq: Four times a day (QID) | ORAL | Status: DC | PRN
  Administered 2017-04-05: 650 mg via ORAL
  Filled 2017-04-05: qty 2

## 2017-04-05 MED ORDER — SODIUM CHLORIDE 0.9% FLUSH
3.0000 mL | Freq: Two times a day (BID) | INTRAVENOUS | Status: DC
  Administered 2017-04-05 – 2017-04-09 (×9): 3 mL via INTRAVENOUS

## 2017-04-05 MED ORDER — FLUTICASONE PROPIONATE 50 MCG/ACT NA SUSP
1.0000 | Freq: Every day | NASAL | Status: DC
  Administered 2017-04-05 – 2017-04-08 (×4): 1 via NASAL
  Filled 2017-04-05: qty 16

## 2017-04-05 MED ORDER — ASPIRIN 81 MG PO CHEW
324.0000 mg | CHEWABLE_TABLET | Freq: Once | ORAL | Status: AC
  Administered 2017-04-05: 324 mg via ORAL
  Filled 2017-04-05: qty 4

## 2017-04-05 NOTE — ED Notes (Signed)
Patients O2 Sats are 89% while walking.

## 2017-04-05 NOTE — H&P (Addendum)
History and Physical    David Guerra YIF:[id number] DOB: 1933-10-02 DOA: 04/05/2017  Referring MD/NP/PA: carryover   PCP: System, Pcp Not In   Patient coming from: home   Chief Complaint: dyspnea   HPI: David Guerra is a 83 y.o. male with known multiple myeloma, CAD s/p PCI last December and VF arrest with ICF, CHD EF 35%, IDDM, recent DVT on Eliquis, multiple recent admissions for dyspnea in April and early May this year. Just few days prior to this admission pt underwent Myoview that showed single fixed defect. Pt was just discharged home 5/10 and now presented back with same concern of persistent dyspnea. Pt explains he has had some intermittent chest discomfort, pressure like and last few minutes at this time, worse with exertion and better with rest, with no specific radiating symptoms. Pt denies fevers, chills, no specific abd or urinary concerns.   ED Course: In ED, pt is hemodynamically stable, VSS, initial oxygen saturation 86% on RA but improved to 100 with 2 L [place]. Blood work notable for Hg 9.3 (was 9.8 on May 11th), Na 134, K 3.2, Cr up from recent May 11th: 1.77 --> 2.16. TRH asked to admit for observation to telemetry unit.   Review of Systems:  Constitutional: Negative for fever, chills, diaphoresis HENT: Negative for ear pain, nosebleeds, congestion, facial swelling Eyes: Negative for pain, discharge, redness, itching and visual disturbance.  Respiratory: Negative for wheezing and stridor.   Cardiovascular: Negative for palpitations Gastrointestinal: Negative for abdominal distention.  Genitourinary: Negative for dysuria, urgency, frequency, hematuria, flank pain, decreased urine volume Musculoskeletal: Negative for back pain, joint swelling, arthralgias and gait problem.  Neurological: Negative for dizziness, tremors, seizures, syncope, facial asymmetry, speech difficulty, weakness, light-headedness, numbness and headaches.  Hematological: Negative for adenopathy. Does not  bruise/bleed easily.  Psychiatric/Behavioral: Negative for hallucinations, behavioral problems, confusion, dysphoric mood, decreased concentration and agitation.   Past Medical History:  Diagnosis Date  . Acute on chronic systolic and diastolic heart failure, NYHA class 4 ([place]) 02/21/2017  . AICD (automatic cardioverter/defibrillator) present    [employer]  . Anemia   . Cancer ([place])    mulitple myeloma  . Chronic systolic CHF (congestive heart failure), NYHA class 3 ([place])   . CKD (chronic kidney disease), stage III   . Coronary artery disease   . Diabetes mellitus without complication ([place])   . Hypertension   . Multiple myeloma ([place])    "the doctor says it's resting"  . Myocardial infarction ([place])   . Prostate cancer ([place])   . PUD (peptic ulcer disease)     Past Surgical History:  Procedure Laterality Date  . APPENDECTOMY    . BACK SURGERY  1999  . CARDIAC DEFIBRILLATOR PLACEMENT  11/2016   [employer]  . CARDIAC DEFIBRILLATOR PLACEMENT    . CORONARY ANGIOPLASTY WITH STENT PLACEMENT  11/2016   total of 2 stents, 1 in January, 1 in February, both in [facility] in [place], [place]   Social Hx:  reports that he has never smoked. He has never used smokeless tobacco. He reports that he drinks alcohol. He reports that he does not use drugs.  No Known Allergies  Family History  Problem Relation Age of Onset  . Dementia Mother   . Cancer Father   . Prostate cancer Brother   . Prostate cancer Brother     Prior to Admission medications   Medication Sig Start Date End Date Taking? Authorizing Provider  allopurinol (ZYLOPRIM) 300 MG tablet Take 300 mg by mouth every morning.  01/16/17  Yes [provider]  amiodarone (PACERONE) 200 MG tablet Take 1 tablet (200 mg total) by mouth daily. Patient taking differently: Take 200 mg by mouth every morning.  02/25/17  Yes Reyne Dumas, MD  atorvastatin (LIPITOR) 40 MG tablet Take  40 mg by mouth at bedtime. For lowering cholesterol 01/16/17  Yes [provider]  carvedilol (COREG) 12.5 MG tablet Take 12.5 mg by mouth every 12 (twelve) hours. For heart 01/16/17  Yes [provider]  clopidogrel (PLAVIX) 75 MG tablet Take 75 mg by mouth every morning.  01/29/17  Yes [provider]  ELIQUIS 5 MG TABS tablet Take 5 mg by mouth 2 (two) times daily. 03/18/17  Yes [provider]  feeding supplement, ENSURE ENLIVE, (ENSURE ENLIVE) LIQD Take 237 mLs by mouth 2 (two) times daily between meals. 04/02/17  Yes Sheikh, Omair Latif, DO  fluticasone (FLONASE) 50 MCG/ACT nasal spray Place 1 spray into both nostrils at bedtime. 01/29/17  Yes [provider]  furosemide (LASIX) 40 MG tablet Take 1 tablet (40 mg total) by mouth 2 (two) times daily. 04/02/17  Yes Sheikh, Omair Latif, DO  glipiZIDE (GLUCOTROL) 5 MG tablet Take 2.5 mg by mouth every morning.  03/25/17  Yes [provider]  hydrALAZINE (APRESOLINE) 25 MG tablet Take 3 tablets (75 mg total) by mouth every 6 (six) hours. 04/02/17  Yes Sheikh, Omair Latif, DO  isosorbide dinitrate (ISORDIL) 30 MG tablet Take 1 tablet (30 mg total) by mouth 3 (three) times daily. 03/30/17 04/29/17 Yes Hongalgi, Lenis Dickinson, MD  latanoprost (XALATAN) 0.005 % ophthalmic solution Place 1 drop into both eyes at bedtime.   Yes [provider]  nitroGLYCERIN (NITROSTAT) 0.4 MG SL tablet Place 1 tablet (0.4 mg total) under the tongue every 5 (five) minutes as needed for chest pain. Up to 3 doses per episode of chest pain. 03/30/17  Yes Hongalgi, Lenis Dickinson, MD  potassium chloride SA (K-DUR,KLOR-CON) 20 MEQ tablet Take 20 mEq by mouth 2 (two) times daily.   Yes [provider]  tamsulosin (FLOMAX) 0.4 MG CAPS capsule Take 0.4 mg by mouth every morning.  01/16/17  Yes [provider]  vitamin B-12 (CYANOCOBALAMIN) 1000 MCG tablet Take 1,000 mcg by mouth every morning.    Yes [provider]   pantoprazole (PROTONIX) 40 MG tablet Take 1 tablet (40 mg total) by mouth daily. Patient not taking: Reported on 04/05/2017 04/03/17   Kerney Elbe, DO    Physical Exam: Vitals:   04/05/17 0535 04/05/17 0600 04/05/17 0615 04/05/17 0659  BP: (!) 144/66 (!) 156/75 (!) 159/80 (!) 145/63  Pulse:      Resp: _0 Temp:    98.1 F (36.7 C)  TempSrc:    Oral  SpO2: 91% 90% 100% 100%  Weight:    85.9 kg (189 lb 6 oz)  Height:    _1  (1.727 m)    Constitutional: NAD, calm, comfortable Vitals:   04/05/17 0535 04/05/17 0600 04/05/17 0615 04/05/17 0659  BP: (!) 144/66 (!) 156/75 (!) 159/80 (!) 145/63  Pulse:      Resp: _2 Temp:    98.1 F (36.7 C)  TempSrc:    Oral  SpO2: 91% 90% 100% 100%  Weight:    85.9 kg (189 lb 6 oz)  Height:    _3  (1.727 m)   Eyes:  PERRL, lids and conjunctivae normal ENMT: Mucous membranes are moist. Posterior pharynx clear of any exudate or lesions.Normal dentition.  Neck: normal, supple, no masses, no thyromegaly Respiratory: Normal respiratory effort. No accessory muscle use. Mild crackles at bases  Cardiovascular: Regular rate and rhythm, no murmurs / rubs / gallops. . 2+ pedal pulses. No carotid bruits.  Abdomen: no tenderness, no masses palpated. No hepatosplenomegaly. Bowel sounds positive.  Musculoskeletal: no clubbing / cyanosis. No joint deformity upper and lower extremities. Good ROM, no contractures. Normal muscle tone.  Skin: no rashes, lesions, ulcers. No induration Neurologic: CN 2-12 grossly intact. Sensation intact, DTR normal. Strength 5/5 in all 4.  Psychiatric: Normal judgment and insight. Alert and oriented x 3. Normal mood.   Labs on Admission: I have personally reviewed following labs and imaging studies  CBC:  Recent Labs Lab 03/31/17 1554 04/01/17 0956 04/02/17 0440 04/03/17 1345 04/05/17 0046  WBC 6.7 4.8 6.5 7.5 9.6  NEUTROABS  --  3.3 4.6 6.4  --   HGB 8.8* 8.6* 8.6* 9.8* 9.3*  HCT 27.0* 27.4*  25.9* 29.0* 27.8*  MCV 82.8 82.5 84.6 85.0 79.2  PLT 158 149* 157 180 326   Basic Metabolic Panel:  Recent Labs Lab 03/31/17 1554 04/01/17 0459 04/02/17 0440 04/03/17 1345 04/05/17 0046  NA 140 143 141 138 134*  K 3.7 3.1* 3.5 3.5 3.2*  CL 110 109 107 102 101  CO2 _0 GLUCOSE 112* 97 97 84 74  BUN 24* 19 23* 23* 29*  CREATININE 1.55* 1.44* 1.58* 1.77* 2.16*  CALCIUM 8.4* 8.4* 8.3* 8.5* 8.5*  MG  --   --  1.9  --   --   PHOS  --   --  3.2  --   --    Liver Function Tests:  Recent Labs Lab 03/31/17 1554 04/02/17 0440  AST 27 19  ALT 52 44  ALKPHOS 55 47  BILITOT 0.4 0.5  PROT 6.5 5.7*  ALBUMIN 3.3* 2.9*    Recent Labs Lab 03/31/17 1554  LIPASE 13   Cardiac Enzymes:  Recent Labs Lab 03/31/17 1952 04/01/17 0459 04/05/17 0254  TROPONINI 0.03* 0.03* 0.05*   CBG:  Recent Labs Lab 04/01/17 1159 04/01/17 1734 04/01/17 2128 04/02/17 0731 04/02/17 1205  GLUCAP 145* 138* 83 94 212*   Radiological Exams on Admission: Dg Chest 2 View  Result Date: 04/05/2017 CLINICAL DATA:  Mid chest pain since noon today with shortness of breath. History of CHF, defibrillator, cancer, coronary artery disease, diabetes, hypertension EXAM: CHEST  2 VIEW COMPARISON:  04/03/2017 FINDINGS: Cardiac pacemaker. Cardiac enlargement with pulmonary vascular congestion. Mild interstitial changes in the lungs likely representing mild edema. Perihilar infiltrates seen previously have improved. Mild blunting of costophrenic angles suggesting small effusions. Degenerative changes in the spine. IMPRESSION: Cardiac enlargement with pulmonary vascular congestion and mild interstitial edema. Small bilateral pleural effusions. Perihilar infiltrates seen previously have improved. Electronically Signed   By: Lucienne Capers M.D.   On: 04/05/2017 01:27   Dg Chest 2 View  Result Date: 04/03/2017 CLINICAL DATA:  Dyspnea beginning this morning. EXAM: CHEST  2 VIEW COMPARISON:  PA and  lateral chest and CT chest 03/31/2017. FINDINGS: Bilateral effusions and airspace disease appear with worse than on the prior plain films. There is cardiomegaly. Pacing device is in place. IMPRESSION: Increased bilateral effusions and airspace disease could be due to asymmetric pulmonary edema and/or pneumonia. Edema is favored. Electronically Signed   By: Inge Rise M.D.  On: 04/03/2017 13:13    EKG: pending   Assessment/Plan Dyspnea with hypoxia, chest pain, acute on chronic combined CHF - some vascular congestion noted but diuresis is limited due to worsening Cr - last EF on ECHO in 02/2017 with EF 35% - hold lasix for now, pt takes 40 mg BID PO, pt has already gotten lasix in ED - reassess Cr in AM and if trending up, can touch base with cardiology for assistance with dosing  - cycle CE's for now, analgesia as needed - not sure that there is much more that can be done as pt is on optimal medical therapy, would not be able to cath with worsening renal function  - monitor daily weights, strict I/O  DM type II complications of nephropathy  - place on SSI until oral intake improves  Hypertensive and CAD, ischemic CM s/p VF arrest with ICD, mild troponin elevation - recent PCI stents x 2 in December done in [place] - recent negative stress test  - pt currently on Plavix, Eliquis, Imdur, beta blocker, hydralazine, amiodarone   Recent DVT - continue with Eliquis   Acute on chronic kidney disease, stage III  - Cr is up on this admission compared to few days ago - could be from lasix and poor oral intake - holding lasix for now - BMP in AM  Anemia of chronic disease - no signs of active bleeding - CBC in AM  Hypokalemia - mild, supplement, BMP In AM  Gout - continue allopurinol   DVT prophylaxis: Eliquis Code Status: Full  Family Communication: Pt updated at bedside, no family at bedside  Disposition Plan: to be determined  Consults called: none  Admission status:  observation   [name] MD [facility] Pager [phone]  If 7PM-7AM, please contact night-coverage [email or web address] Password [facility]  04/05/2017, 7:24 AM

## 2017-04-05 NOTE — ED Triage Notes (Signed)
Pt reports having chest pain that began at 1200 on 04/04/17 and was intermittent in nature. Pt states that it feels like a pressure and throbbing in chest. Pt reports taking nitroglycerine at 1800

## 2017-04-05 NOTE — ED Notes (Signed)
Patient put on [place] 3L due to O2 Sats being 89%

## 2017-04-05 NOTE — Progress Notes (Signed)
Hypoglycemic Event  CBG: 56  Treatment: 15 GM carbohydrate snack  Symptoms: None  Follow-up CBG: Time:1337 CBG Result:99  Possible Reasons for Event: Inadequate meal intake  Comments/MD notified:MD notified    David Guerra

## 2017-04-05 NOTE — Discharge Instructions (Signed)
Follow up with your cardiologist  

## 2017-04-05 NOTE — ED Provider Notes (Addendum)
[facility] DEPT Provider Note   CSN: [id number] Arrival date & time: 04/05/17  0016  By signing my name below, I, Oleh Genin, attest that this documentation has been prepared under the direction and in the presence of Deno Etienne, DO. Electronically Signed: Oleh Genin, Scribe. 04/05/17. 1:33 AM.   History   Chief Complaint Chief Complaint  Patient presents with  . Chest Pain    HPI David Guerra is a 83 y.o. male with history of multiple myeloma, CAD s/p PCI last Dec and VF arrest with ICD, CHD EF 35%, COPD, IDDM, and recent DVT who presents to the ED with chest pain. This patient states that several hours ago he developed central chest "soreness" that has been constant. Reporting nausea; but no diaphoresis or lightheadedness. He has attempted Ibuprofen without relief. Denies any abdominal pain, vomiting, or diarrhea. He is primarily requesting "something to take away the pain".  The history is provided by the patient. No language interpreter was used.  Chest Pain   This is a new problem. The current episode started 3 to 5 hours ago. The problem occurs constantly. The problem has not changed since onset.Pain location: L sternal. The pain is moderate. The quality of the pain is described as dull. The pain does not radiate. Pertinent negatives include no abdominal pain, no fever, no headaches, no palpitations, no shortness of breath and no vomiting.    Past Medical History:  Diagnosis Date  . Acute on chronic systolic and diastolic heart failure, NYHA class 4 ([place]) 02/21/2017  . AICD (automatic cardioverter/defibrillator) present    [employer]  . Anemia   . Cancer ([place])    mulitple myeloma  . Chronic systolic CHF (congestive heart failure), NYHA class 3 ([place])   . CKD (chronic kidney disease), stage III   . Coronary artery disease   . Diabetes mellitus without complication ([place])   . Hypertension   . Multiple myeloma ([place])    "the doctor says it's resting"  .  Myocardial infarction ([place])   . Prostate cancer ([place])   . PUD (peptic ulcer disease)     Patient Active Problem List   Diagnosis Date Noted  . History of myocardial infarction 04/01/2017  . Acute on chronic systolic CHF (congestive heart failure) ([place]) 04/01/2017  . Normocytic anemia 03/27/2017  . Acute bronchitis 03/05/2017  . Chronic combined systolic and diastolic CHF (congestive heart failure) ([place]) 03/05/2017  . CKD (chronic kidney disease), stage III   . Chest pain 02/21/2017  . Acute on chronic systolic and diastolic heart failure, NYHA class 4 ([place]) 02/21/2017  . Essential hypertension 02/21/2017  . AICD (automatic cardioverter/defibrillator) present 02/21/2017  . CAD S/P percutaneous coronary angioplasty 02/21/2017  . Type 2 diabetes mellitus with stage 3 chronic kidney disease, without long-term current use of insulin ([place]) 02/21/2017    Past Surgical History:  Procedure Laterality Date  . APPENDECTOMY    . BACK SURGERY  1999  . CARDIAC DEFIBRILLATOR PLACEMENT  11/2016   [employer]  . CARDIAC DEFIBRILLATOR PLACEMENT    . CORONARY ANGIOPLASTY WITH STENT PLACEMENT  11/2016   total of 2 stents, 1 in January, 1 in February, both in [facility] in [place], [place] Medications    Prior to Admission medications   Medication Sig Start Date End Date Taking? Authorizing Provider  allopurinol (ZYLOPRIM) 300 MG tablet Take 300 mg by mouth every morning.  01/16/17  Yes  [provider]  amiodarone (PACERONE) 200 MG tablet Take 1 tablet (200 mg total) by mouth daily. Patient taking differently: Take 200 mg by mouth every morning.  02/25/17  Yes Reyne Dumas, MD  atorvastatin (LIPITOR) 40 MG tablet Take 40 mg by mouth at bedtime. For lowering cholesterol 01/16/17  Yes [provider]  carvedilol (COREG) 12.5 MG tablet Take 12.5 mg by mouth every 12 (twelve) hours. For heart 01/16/17  Yes [provider]   clopidogrel (PLAVIX) 75 MG tablet Take 75 mg by mouth every morning.  01/29/17  Yes [provider]  ELIQUIS 5 MG TABS tablet Take 5 mg by mouth 2 (two) times daily. 03/18/17  Yes [provider]  feeding supplement, ENSURE ENLIVE, (ENSURE ENLIVE) LIQD Take 237 mLs by mouth 2 (two) times daily between meals. 04/02/17  Yes Sheikh, Omair Latif, DO  fluticasone (FLONASE) 50 MCG/ACT nasal spray Place 1 spray into both nostrils at bedtime. 01/29/17  Yes [provider]  furosemide (LASIX) 40 MG tablet Take 1 tablet (40 mg total) by mouth 2 (two) times daily. 04/02/17  Yes Sheikh, Omair Latif, DO  glipiZIDE (GLUCOTROL) 5 MG tablet Take 2.5 mg by mouth every morning.  03/25/17  Yes [provider]  hydrALAZINE (APRESOLINE) 25 MG tablet Take 3 tablets (75 mg total) by mouth every 6 (six) hours. 04/02/17  Yes Sheikh, Omair Latif, DO  isosorbide dinitrate (ISORDIL) 30 MG tablet Take 1 tablet (30 mg total) by mouth 3 (three) times daily. 03/30/17 04/29/17 Yes Hongalgi, Lenis Dickinson, MD  latanoprost (XALATAN) 0.005 % ophthalmic solution Place 1 drop into both eyes at bedtime.   Yes [provider]  nitroGLYCERIN (NITROSTAT) 0.4 MG SL tablet Place 1 tablet (0.4 mg total) under the tongue every 5 (five) minutes as needed for chest pain. Up to 3 doses per episode of chest pain. 03/30/17  Yes Hongalgi, Lenis Dickinson, MD  potassium chloride SA (K-DUR,KLOR-CON) 20 MEQ tablet Take 20 mEq by mouth 2 (two) times daily.   Yes [provider]  tamsulosin (FLOMAX) 0.4 MG CAPS capsule Take 0.4 mg by mouth every morning.  01/16/17  Yes [provider]  vitamin B-12 (CYANOCOBALAMIN) 1000 MCG tablet Take 1,000 mcg by mouth every morning.    Yes [provider]  pantoprazole (PROTONIX) 40 MG tablet Take 1 tablet (40 mg total) by mouth daily. Patient not taking: Reported on 04/05/2017 04/03/17   Kerney Elbe, DO    Family History Family History  Problem Relation Age of Onset   . Dementia Mother   . Cancer Father   . Prostate cancer Brother   . Prostate cancer Brother     Social History Social History  Substance Use Topics  . Smoking status: Never Smoker  . Smokeless tobacco: Never Used  . Alcohol use Yes     Comment: occasionally     Allergies   Patient has no known allergies.   Review of Systems Review of Systems  Constitutional: Negative for chills and fever.  HENT: Negative for congestion and facial swelling.   Eyes: Negative for discharge and visual disturbance.  Respiratory: Negative for shortness of breath.   Cardiovascular: Positive for chest pain. Negative for palpitations.  Gastrointestinal: Negative for abdominal pain, diarrhea and vomiting.  Musculoskeletal: Negative for arthralgias and myalgias.  Skin: Negative for color change and rash.  Neurological: Negative for tremors, syncope and headaches.  Psychiatric/Behavioral: Negative for confusion and dysphoric mood.     Physical Exam Updated Vital Signs  BP (!) 144/66   Pulse 64   Temp 98.2 F (36.8 C) (Oral)   Resp 13   Ht 5' 8"  (1.727 m)   Wt 190 lb (86.2 kg)   SpO2 91%   BMI 28.89 kg/m   Physical Exam  Constitutional: He is oriented to person, place, and time. He appears well-developed and well-nourished.  HENT:  Head: Normocephalic and atraumatic.  Eyes: EOM are normal. Pupils are equal, round, and reactive to light.  Neck: Normal range of motion. Neck supple. No JVD present.  Cardiovascular: Normal rate and regular rhythm.  Exam reveals no gallop and no friction rub.   No murmur heard. Pulmonary/Chest: Breath sounds normal. No respiratory distress. He has no wheezes.  There is tenderness over the L sternal boarder which reproduces the patient's pain.  Abdominal: He exhibits no distension. There is no rebound and no guarding.  Musculoskeletal: Normal range of motion.  Neurological: He is alert and oriented to person, place, and time.  Skin: No rash noted. No  pallor.  Psychiatric: He has a normal mood and affect. His behavior is normal.  Nursing note and vitals reviewed.    ED Treatments / Results  Labs (all labs ordered are listed, but only abnormal results are displayed) Labs Reviewed  BASIC METABOLIC PANEL - Abnormal; Notable for the following:       Result Value   Sodium 134 (*)    Potassium 3.2 (*)    BUN 29 (*)    Creatinine, Ser 2.16 (*)    Calcium 8.5 (*)    GFR calc non Af Amer 27 (*)    GFR calc Af Amer 31 (*)    All other components within normal limits  CBC - Abnormal; Notable for the following:    RBC 3.51 (*)    Hemoglobin 9.3 (*)    HCT 27.8 (*)    RDW 17.8 (*)    All other components within normal limits  BRAIN NATRIURETIC PEPTIDE - Abnormal; Notable for the following:    B Natriuretic Peptide 644.2 (*)    All other components within normal limits  TROPONIN I - Abnormal; Notable for the following:    Troponin I 0.05 (*)    All other components within normal limits  BLOOD GAS, ARTERIAL - Abnormal; Notable for the following:    pH, Arterial 7.512 (*)    pO2, Arterial 56.8 (*)    Acid-Base Excess 2.9 (*)    All other components within normal limits  I-STAT TROPOININ, ED - Abnormal; Notable for the following:    Troponin i, poc 0.10 (*)    All other components within normal limits    EKG  EKG Interpretation  Date/Time:  Sunday Apr 05 2017 04:09:44 EDT Ventricular Rate:  60 PR Interval:    QRS Duration: 112 QT Interval:  522 QTC Calculation: 522 R Axis:   -79 Text Interpretation:  Atrial-paced rhythm Borderline IVCD with LAD Probable anterior infarct, old Nonspecific repol abnormality, lateral leads Prolonged QT interval Baseline wander in lead(s) I II III aVR aVF No significant change since last tracing Confirmed by Courtenay Hirth MD, DANIEL [phone]) on 04/05/2017 5:16:54 AM       Radiology Dg Chest 2 View  Result Date: 04/05/2017 CLINICAL DATA:  Mid chest pain since noon today with shortness of breath. History  of CHF, defibrillator, cancer, coronary artery disease, diabetes, hypertension EXAM: CHEST  2 VIEW COMPARISON:  04/03/2017 FINDINGS: Cardiac pacemaker. Cardiac enlargement with pulmonary vascular congestion. Mild interstitial changes in  the lungs likely representing mild edema. Perihilar infiltrates seen previously have improved. Mild blunting of costophrenic angles suggesting small effusions. Degenerative changes in the spine. IMPRESSION: Cardiac enlargement with pulmonary vascular congestion and mild interstitial edema. Small bilateral pleural effusions. Perihilar infiltrates seen previously have improved. Electronically Signed   By: Lucienne Capers M.D.   On: 04/05/2017 01:27   Dg Chest 2 View  Result Date: 04/03/2017 CLINICAL DATA:  Dyspnea beginning this morning. EXAM: CHEST  2 VIEW COMPARISON:  PA and lateral chest and CT chest 03/31/2017. FINDINGS: Bilateral effusions and airspace disease appear with worse than on the prior plain films. There is cardiomegaly. Pacing device is in place. IMPRESSION: Increased bilateral effusions and airspace disease could be due to asymmetric pulmonary edema and/or pneumonia. Edema is favored. Electronically Signed   By: Inge Rise M.D.   On: 04/03/2017 13:13    Procedures Procedures (including critical care time)  Medications Ordered in ED Medications  aspirin chewable tablet 324 mg (324 mg Oral Given 04/05/17 0153)  nitroGLYCERIN (NITROSTAT) SL tablet 0.4 mg (0.4 mg Sublingual Given 04/05/17 0348)  alum & mag hydroxide-simeth (MAALOX/MYLANTA) 200-200-20 MG/5ML suspension 15 mL (15 mLs Oral Given 04/05/17 0422)     Initial Impression / Assessment and Plan / ED Course  I have reviewed the triage vital signs and the nursing notes.  Pertinent labs & imaging results that were available during my care of the patient were reviewed by me and considered in my medical decision making (see chart for details).     83 yo M With a chief complaint of chest pain.  This pain is atypical in nature. This is his fourth visit in about 4 weeks for the same. He has been admitted twice for a similar complaint. Pain is reproduced on palpation of the chest. His troponin was very mildly positive at 0.1. EKG unchanged. I discussed the case with Dr. Harrell Gave, cardiology. She recommended doing a delta troponin with a non-point of care troponin. If this is negative for improving with the atypical symptoms she feels he is likely able to be safely discharged with outpatient follow-up.  Second troponin is downward trending. Patient continues to have episodic pain lasting for about 15-20 minutes at a time. EKG captured during this time with no change. Discussed options with the patient is seen as recently been in the hospital 2 times. He states that he would like to go home. Suggested he follow-up with a local cardiologist of these can continue to stay in this area.  The nurse was concerned about the patient being discharged because he had had multiple events where he became sweaty and had worsening chest pain and became hypoxic. I take the patient off of oxygen and then on reassessment his O2 sat was 86% with a good waveform. No significant shortness of breath on my repeat exam. I had an EKG repeated that showed no significant change. I discussed this again with the cardiologist. At this point as the patient is becoming hypoxic feel that he needs to stay in the hospital.   1. Atypical chest pain   2. Hypoxia      Medications given during this visit Medications  aspirin chewable tablet 324 mg (324 mg Oral Given 04/05/17 0153)  nitroGLYCERIN (NITROSTAT) SL tablet 0.4 mg (0.4 mg Sublingual Given 04/05/17 0348)  alum & mag hydroxide-simeth (MAALOX/MYLANTA) 200-200-20 MG/5ML suspension 15 mL (15 mLs Oral Given 04/05/17 0422)          Deno Etienne, DO 04/05/17 1610

## 2017-04-05 NOTE — Progress Notes (Addendum)
Hypoglycemic Event  CBG: 60  Treatment: 15 GM carbohydrate snack  Symptoms: None  Follow-up CBG: [id number] CBG Result:96  Possible Reasons for Event: Unknown    David Guerra   Encouraged oral intake, home antihyperglycemic regimen has been held, only placed on SSI if needed. Pt responding to oral intake.   [name], MD  [facility] Pager [phone]  If 7PM-7AM, please contact night-coverage [email or web address] Password [email or web address]

## 2017-04-05 NOTE — ED Notes (Addendum)
Notified EDP,Floyd,MD., pt. I-stat troponin results 0.10 And RN,Jeneen made aware.

## 2017-04-06 DIAGNOSIS — Z792 Long term (current) use of antibiotics: Secondary | ICD-10-CM | POA: Diagnosis not present

## 2017-04-06 DIAGNOSIS — Z7901 Long term (current) use of anticoagulants: Secondary | ICD-10-CM | POA: Diagnosis not present

## 2017-04-06 DIAGNOSIS — Z79899 Other long term (current) drug therapy: Secondary | ICD-10-CM | POA: Diagnosis not present

## 2017-04-06 DIAGNOSIS — I130 Hypertensive heart and chronic kidney disease with heart failure and stage 1 through stage 4 chronic kidney disease, or unspecified chronic kidney disease: Secondary | ICD-10-CM | POA: Diagnosis present

## 2017-04-06 DIAGNOSIS — R079 Chest pain, unspecified: Secondary | ICD-10-CM

## 2017-04-06 DIAGNOSIS — J189 Pneumonia, unspecified organism: Principal | ICD-10-CM

## 2017-04-06 DIAGNOSIS — R0902 Hypoxemia: Secondary | ICD-10-CM

## 2017-04-06 DIAGNOSIS — J9601 Acute respiratory failure with hypoxia: Secondary | ICD-10-CM | POA: Diagnosis not present

## 2017-04-06 DIAGNOSIS — N400 Enlarged prostate without lower urinary tract symptoms: Secondary | ICD-10-CM | POA: Diagnosis present

## 2017-04-06 DIAGNOSIS — N183 Chronic kidney disease, stage 3 (moderate): Secondary | ICD-10-CM | POA: Diagnosis not present

## 2017-04-06 DIAGNOSIS — D638 Anemia in other chronic diseases classified elsewhere: Secondary | ICD-10-CM | POA: Diagnosis present

## 2017-04-06 DIAGNOSIS — E1121 Type 2 diabetes mellitus with diabetic nephropathy: Secondary | ICD-10-CM | POA: Diagnosis present

## 2017-04-06 DIAGNOSIS — E876 Hypokalemia: Secondary | ICD-10-CM | POA: Diagnosis present

## 2017-04-06 DIAGNOSIS — I255 Ischemic cardiomyopathy: Secondary | ICD-10-CM | POA: Diagnosis present

## 2017-04-06 DIAGNOSIS — Z8546 Personal history of malignant neoplasm of prostate: Secondary | ICD-10-CM | POA: Diagnosis not present

## 2017-04-06 DIAGNOSIS — I2510 Atherosclerotic heart disease of native coronary artery without angina pectoris: Secondary | ICD-10-CM | POA: Diagnosis not present

## 2017-04-06 DIAGNOSIS — Z9861 Coronary angioplasty status: Secondary | ICD-10-CM | POA: Diagnosis not present

## 2017-04-06 DIAGNOSIS — I252 Old myocardial infarction: Secondary | ICD-10-CM | POA: Diagnosis not present

## 2017-04-06 DIAGNOSIS — M810 Age-related osteoporosis without current pathological fracture: Secondary | ICD-10-CM | POA: Diagnosis present

## 2017-04-06 DIAGNOSIS — E119 Type 2 diabetes mellitus without complications: Secondary | ICD-10-CM | POA: Diagnosis not present

## 2017-04-06 DIAGNOSIS — I10 Essential (primary) hypertension: Secondary | ICD-10-CM | POA: Diagnosis not present

## 2017-04-06 DIAGNOSIS — I5043 Acute on chronic combined systolic (congestive) and diastolic (congestive) heart failure: Secondary | ICD-10-CM | POA: Diagnosis not present

## 2017-04-06 DIAGNOSIS — Y95 Nosocomial condition: Secondary | ICD-10-CM | POA: Diagnosis present

## 2017-04-06 DIAGNOSIS — Z86718 Personal history of other venous thrombosis and embolism: Secondary | ICD-10-CM | POA: Diagnosis not present

## 2017-04-06 DIAGNOSIS — C9000 Multiple myeloma not having achieved remission: Secondary | ICD-10-CM | POA: Diagnosis present

## 2017-04-06 DIAGNOSIS — Z95810 Presence of automatic (implantable) cardiac defibrillator: Secondary | ICD-10-CM | POA: Diagnosis not present

## 2017-04-06 DIAGNOSIS — M109 Gout, unspecified: Secondary | ICD-10-CM | POA: Diagnosis present

## 2017-04-06 DIAGNOSIS — E1122 Type 2 diabetes mellitus with diabetic chronic kidney disease: Secondary | ICD-10-CM | POA: Diagnosis not present

## 2017-04-06 DIAGNOSIS — R0789 Other chest pain: Secondary | ICD-10-CM | POA: Diagnosis not present

## 2017-04-06 DIAGNOSIS — D649 Anemia, unspecified: Secondary | ICD-10-CM | POA: Diagnosis not present

## 2017-04-06 DIAGNOSIS — Z955 Presence of coronary angioplasty implant and graft: Secondary | ICD-10-CM | POA: Diagnosis not present

## 2017-04-06 LAB — CBC
HEMATOCRIT: 22.7 % — AB (ref 39.0–52.0)
HEMOGLOBIN: 7.5 g/dL — AB (ref 13.0–17.0)
MCH: 26.5 pg (ref 26.0–34.0)
MCHC: 33 g/dL (ref 30.0–36.0)
MCV: 80.2 fL (ref 78.0–100.0)
Platelets: 140 10*3/uL — ABNORMAL LOW (ref 150–400)
RBC: 2.83 MIL/uL — AB (ref 4.22–5.81)
RDW: 17.9 % — ABNORMAL HIGH (ref 11.5–15.5)
WBC: 7 10*3/uL (ref 4.0–10.5)

## 2017-04-06 LAB — URINALYSIS, ROUTINE W REFLEX MICROSCOPIC
BACTERIA UA: NONE SEEN
Bilirubin Urine: NEGATIVE
GLUCOSE, UA: NEGATIVE mg/dL
Hgb urine dipstick: NEGATIVE
KETONES UR: NEGATIVE mg/dL
Leukocytes, UA: NEGATIVE
Nitrite: NEGATIVE
PROTEIN: NEGATIVE mg/dL
Specific Gravity, Urine: 1.013 (ref 1.005–1.030)
pH: 5 (ref 5.0–8.0)

## 2017-04-06 LAB — GLUCOSE, CAPILLARY
GLUCOSE-CAPILLARY: 157 mg/dL — AB (ref 65–99)
Glucose-Capillary: 80 mg/dL (ref 65–99)
Glucose-Capillary: 130 mg/dL — ABNORMAL HIGH (ref 65–99)
Glucose-Capillary: 134 mg/dL — ABNORMAL HIGH (ref 65–99)

## 2017-04-06 LAB — BASIC METABOLIC PANEL
ANION GAP: 9 (ref 5–15)
Anion gap: 7 (ref 5–15)
BUN: 31 mg/dL — ABNORMAL HIGH (ref 6–20)
BUN: 34 mg/dL — ABNORMAL HIGH (ref 6–20)
CALCIUM: 7.9 mg/dL — AB (ref 8.9–10.3)
CALCIUM: 8 mg/dL — AB (ref 8.9–10.3)
CHLORIDE: 101 mmol/L (ref 101–111)
CO2: 26 mmol/L (ref 22–32)
CO2: 28 mmol/L (ref 22–32)
CREATININE: 1.89 mg/dL — AB (ref 0.61–1.24)
Chloride: 100 mmol/L — ABNORMAL LOW (ref 101–111)
Creatinine, Ser: 1.97 mg/dL — ABNORMAL HIGH (ref 0.61–1.24)
GFR calc non Af Amer: 30 mL/min — ABNORMAL LOW (ref >60)
GFR calc non Af Amer: 31 mL/min — ABNORMAL LOW (ref >60)
GFR, EST AFRICAN AMERICAN: 35 mL/min — AB (ref >60)
GFR, EST AFRICAN AMERICAN: 36 mL/min — AB (ref >60)
GLUCOSE: 141 mg/dL — AB (ref 65–99)
Glucose, Bld: 89 mg/dL (ref 65–99)
POTASSIUM: 3.8 mmol/L (ref 3.5–5.1)
Potassium: 4 mmol/L (ref 3.5–5.1)
Sodium: 136 mmol/L (ref 135–145)
Sodium: 135 mmol/L (ref 135–145)

## 2017-04-06 LAB — MRSA PCR SCREENING: MRSA by PCR: NEGATIVE

## 2017-04-06 MED ORDER — DEXTROSE 5 % IV SOLN
1.0000 g | INTRAVENOUS | Status: DC
  Administered 2017-04-06 – 2017-04-08 (×3): 1 g via INTRAVENOUS
  Filled 2017-04-06 (×3): qty 1

## 2017-04-06 MED ORDER — VANCOMYCIN HCL IN DEXTROSE 1-5 GM/200ML-% IV SOLN
1000.0000 mg | INTRAVENOUS | Status: DC
  Administered 2017-04-06: 1000 mg via INTRAVENOUS
  Filled 2017-04-06: qty 200

## 2017-04-06 NOTE — Progress Notes (Signed)
CSW consulted to assist pt with [facility] needs. CSW is unable to assist with this request. RNCM is aware and will assist with d/c planning needs.  CSW signing off.  Werner Lean LCSW [phone]

## 2017-04-06 NOTE — Care Management Note (Signed)
Case Management Note  Patient Details  Name: David Guerra MRN: [id number] Date of Birth: 11/11/1933  Subjective/Objective: 83 y/o m admitted w/chest pain. From home.Noted on 02-will monitor if needed @ home.                   Action/Plan:d/c plan home.   Expected Discharge Date:                  Expected Discharge Plan:  Home/Self Care  In-House Referral:     Discharge planning Services  CM Consult  Post Acute Care Choice:    Choice offered to:     DME Arranged:    DME Agency:     HH Arranged:    HH Agency:     Status of Service:  In process, will continue to follow  If discussed at Long Length of Stay Meetings, dates discussed:    Additional Comments:  Dessa Phi, RN 04/06/2017, 12:15 PM

## 2017-04-06 NOTE — Care Management Note (Signed)
Case Management Note  Patient Details  Name: David Guerra MRN: [id number] Date of Birth: Jan 19, 1933  Subjective/Objective:PT recc HHPT. Patient states he will be flying back to [place] @ d/c. Encouraged patient to stay w/dtr-Donna who lives in [place] for a few days prior going back-he will think about it.Provided patient w/HHC agency list as resource if stays in [place]. dtr Butch Penny [phone] [place]. Explained that if he flies back to [place] he will have to set up [place] w/his pcp-patient voiced understanding.                    Action/Plan:d/c plan home w/HHC.   Expected Discharge Date:                  Expected Discharge Plan:  [place]  In-House Referral:     Discharge planning Services  CM Consult  Post Acute Care Choice:    Choice offered to:  Patient  DME Arranged:    DME Agency:     HH Arranged:    [place] Agency:     Status of Service:  In process, will continue to follow  If discussed at Long Length of Stay Meetings, dates discussed:    Additional Comments:  Dessa Phi, RN 04/06/2017, 2:55 PM

## 2017-04-06 NOTE — Evaluation (Signed)
Physical Therapy Evaluation Patient Details Name: David Guerra MRN: [id number] DOB: Dec 12, 1932 Today's Date: 04/06/2017   History of Present Illness  83 y.o. male w/ hx M 12/2017I>>stents, CHF, AICD, MML, DM, HTN, HLD, visiting here from [place], developed worsening SOB and was admitted 04/05/17  Clinical Impression  The patient is limited today by DOE. Ambulated only 10' x 2 with 3 liters [place]. Patient is visiting with daughter currently in [place]. Pt admitted with above diagnosis. Pt currently with functional limitations due to the deficits listed below (see PT Problem List).  Pt will benefit from skilled PT to increase their independence and safety with mobility to allow discharge to the venue listed below.       Follow Up Recommendations Home health PT;Supervision/Assistance - 24 hour    Equipment Recommendations  None recommended by PT    Recommendations for Other Services       Precautions / Restrictions Precautions Precautions: Fall Precaution Comments: monitor sats      Mobility  Bed Mobility Overal bed mobility: Needs Assistance Bed Mobility: Supine to Sit     Supine to sit: Min assist     General bed mobility comments: assist with trunk  Transfers Overall transfer level: Needs assistance Equipment used: Straight cane Transfers: Sit to/from Stand Sit to Stand: Min guard            Ambulation/Gait Ambulation/Gait assistance: Min guard Ambulation Distance (Feet): 10 Feet Assistive device: Straight cane Gait Pattern/deviations: Step-to pattern;Step-through pattern     General Gait Details: ambulated to BR and then back to recliner. Noted 3/4 dyspnea. Sats on 3  liters are 94%, HR 80  Stairs            Wheelchair Mobility    Modified Rankin (Stroke Patients Only)       Balance Overall balance assessment: Needs assistance Sitting-balance support: Feet supported;No upper extremity supported Sitting balance-Leahy Scale: Good     Standing  balance support: During functional activity;Single extremity supported Standing balance-Leahy Scale: Fair                               Pertinent Vitals/Pain Pain Assessment: No/denies pain    Home Living Family/patient expects to be discharged to:: Private residence Living Arrangements: Children Available Help at Discharge: Family;Available PRN/intermittently Type of Home: House Home Access: Stairs to enter Entrance Stairs-Rails: Can reach both Entrance Stairs-Number of Steps: 3 Home Layout: One level Home Equipment: Cane - single point;Grab bars - tub/shower Additional Comments: pt staying with [facility]    Prior Function Level of Independence: Independent with assistive device(s)   Gait / Transfers Assistance Needed: uses cane in community           Hand Dominance        Extremity/Trunk Assessment   Upper Extremity Assessment Upper Extremity Assessment: Overall WFL for tasks assessed    Lower Extremity Assessment Lower Extremity Assessment: Overall WFL for tasks assessed    Cervical / Trunk Assessment Cervical / Trunk Assessment: Kyphotic  Communication      Cognition Arousal/Alertness: Awake/alert Behavior During Therapy: WFL for tasks assessed/performed Overall Cognitive Status: Within Functional Limits for tasks assessed                                        General Comments      Exercises  Assessment/Plan    PT Assessment Patient needs continued PT services  PT Problem List Decreased activity tolerance;Decreased strength;Decreased safety awareness;Decreased knowledge of precautions;Decreased mobility;Cardiopulmonary status limiting activity       PT Treatment Interventions DME instruction;Gait training;Functional mobility training;Therapeutic activities;Patient/family education    PT Goals (Current goals can be found in the Care Plan section)  Acute Rehab PT Goals Patient Stated Goal: to go back to [place] PT  Goal Formulation: With patient Time For Goal Achievement: 04/20/17 Potential to Achieve Goals: Good    Frequency Min 3X/week   Barriers to discharge        Co-evaluation               AM-PAC PT "6 Clicks" Daily Activity  Outcome Measure Difficulty turning over in bed (including adjusting bedclothes, sheets and blankets)?: A Little Difficulty moving from lying on back to sitting on the side of the bed? : A Little Difficulty sitting down on and standing up from a chair with arms (e.g., wheelchair, bedside commode, etc,.)?: A Little Help needed moving to and from a bed to chair (including a wheelchair)?: A Little Help needed walking in hospital room?: A Little Help needed climbing 3-5 steps with a railing? : A Lot 6 Click Score: 17    End of Session   Activity Tolerance: Patient limited by fatigue Patient left: in chair;with chair alarm set Nurse Communication: Mobility status PT Visit Diagnosis: Unsteadiness on feet (R26.81)    Time: 1696-7893 PT Time Calculation (min) (ACUTE ONLY): 23 min   Charges:   PT Evaluation $PT Eval Low Complexity: 1 Procedure PT Treatments $Gait Training: 8-22 mins   PT G Codes:   PT G-Codes **NOT FOR INPATIENT CLASS** Functional Assessment Tool Used: AM-PAC 6 Clicks Basic Mobility;Clinical judgement Functional Limitation: Mobility: Walking and moving around Mobility: Walking and Moving Around Current Status (Y1017): At least 40 percent but less than 60 percent impaired, limited or restricted Mobility: Walking and Moving Around Goal Status [phone]): 0 percent impaired, limited or restricted    [facility] PT [phone]   Claretha Cooper 04/06/2017, 1:14 PM

## 2017-04-06 NOTE — Progress Notes (Signed)
MD, [name], made aware of pt's c/o increasing work of breathing, crackles in lung bases, and temperature of 101. New orders given for Urinalysis, blood cultures, serum lactic acid,Tylenol and 40 mg IV Lasix. Will carry out new orders and continue to monitor pt closely. David Guerra

## 2017-04-06 NOTE — Progress Notes (Signed)
PROGRESS NOTE    David Guerra  JXB:[id number] DOB: 1933-04-27 DOA: 04/05/2017 PCP: System, Pcp Not In  Brief Narrative:  David Guerra is a 83 y.o. male with known multiple myeloma, CAD s/p PCI last December and VF arrest with ICF, CHD EF 35%, IDDM, recent DVT on Eliquis, multiple recent admissions for dyspnea in April and early May this year. Just few days prior to this admission pt underwent Myoview that showed single fixed defect. Pt was just discharged home 5/10 and now presented back with same concern of persistent dyspnea. Pt explains he has had some intermittent chest discomfort, pressure like and last few minutes at this time, worse with exertion and better with rest, with no specific radiating symptoms. Pt denies fevers, chills, no specific abd or urinary concerns.  In ED, pt was hemodynamically stable, VSS, initial oxygen saturation 86% on RA but improved to 100 with 2 L [place]. Blood work notable for Hg 9.3 (was 9.8 on May 11th), Na 134, K 3.2, Cr up from recent May 11th: 1.77 -> 2.16. [facility] asked to admit for observation to telemetry unit. Patient spiked a temperature overnight and because of his hypoxia and being hospitalized multiple times this month was empirically treated for an HCAP.  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 Small bilateral pleural effusions. Perihilar infiltrates seen previously have improved. -Diuresis limited during last  hospitalization by worsening creatinine but given IV Lasix and improved.  -LVEF 35-40 percent by echo 02/2017 -BNP was 644.2 and on 5/11 it was 639.5 -Strict I's and O's and Daily Weights. 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 In [place] -Recent Negative Stress Test on 03/29/17 -Troponin Flat at 0.05, 0.04, and 0.05 -Continue Hydralazine, Isordil, Beta Blocker and Statin -Continue Plavix, PPI -Continue Amiodarone  -Per Cards Myoview on 03/29/17 showed scar but no ischemia -Follow up with Cardiology as an outpatient  Recent DVT -Continue Eliquis 5 mg po BID  Acute on Chronic CKD stage III -Cr went from 2.16 -> 1.97 -> 1.89 -Improved with Diuresis -Repeat CMP at SNF  Normocytic Anemia: -Baseline 9-10 -Hb/Hct was 7.5/22.7 -Continue to Monitor S/Sx of Bleeding -Repeat CMP in AM  Hx of Prostate Cancer/BPH -C/w Tamsulosin 0.4 mg po Daily  Hx of Gout -C/w with Allopurinol 300 mg po daily  DVT prophylaxis: Anticoagulated with Apixaban Code Status: FULL CODE Family Communication: No Family present at bedside Disposition Plan: Home Health PT; Supervision/Assistance -24 hour  Consultants:   None   Procedures: None   Antimicrobials:  Anti-infectives    Start  Dose/Rate Route Frequency Ordered Stop   04/06/17 1200  ceFEPIme (MAXIPIME) 1 g in dextrose 5 % 50 mL IVPB     1 g 100 mL/hr over 30 Minutes Intravenous Every 24 hours 04/06/17 1100     04/06/17 1200  vancomycin (VANCOCIN) IVPB 1000 mg/200 mL  premix     1,000 mg 200 mL/hr over 60 Minutes Intravenous Every 24 hours 04/06/17 1100        Subjective: Seen and examined at bedside and stated he was Still SOB and felt scared whenever he goes home and has this chest pain given his hx of cardiac defibrillation. No nausea or vomiting and states he is coughing some but its not productive.   Objective: Vitals:   04/05/17 2305 04/06/17 0056 04/06/17 0539 04/06/17 1428  BP:  122/60 116/60 (!) 106/50  Pulse:   60 62  Resp:   20 20  Temp: 98.4 F (36.9 C)  98.1 F (36.7 C) 98.7 F (37.1 C)  TempSrc: Oral  Oral Oral  SpO2:   99% 100%  Weight:   85.9 kg (189 lb 6 oz)   Height:        Intake/Output Summary (Last 24 hours) at 04/06/17 1817 Last data filed at 04/06/17 1500  Gross per 24 hour  Intake              250 ml  Output              525 ml  Net             -275 ml   Filed Weights   04/05/17 0027 04/05/17 0659 04/06/17 0539  Weight: 86.2 kg (190 lb) 85.9 kg (189 lb 6 oz) 85.9 kg (189 lb 6 oz)   Examination: Physical Exam:  Constitutional: WN/WD, NAD and appears calm and comfortable Eyes: Lids and conjunctivae normal, sclerae anicteric  ENMT: External Ears, Nose appear normal. Grossly normal hearing. Mucous Membrane moist Neck: Appears normal, supple, no cervical masses, normal ROM, no appreciable thyromegaly, minimal JVD Respiratory: Diminished to auscultation bilaterally, no wheezing, rales, rhonchi or crackles. Slight increased respiratory effort. No accessory muscle use. Wearing supplemental O2 via [place].  Cardiovascular: RRR, no murmurs / rubs / gallops. S1 and S2 auscultated. No extremity edema.  Abdomen: Soft, non-tender, non-distended. No masses palpated. No appreciable hepatosplenomegaly. Bowel sounds positive x4.  GU: Deferred. Musculoskeletal: No clubbing / cyanosis of digits/nails. No joint deformity upper and lower extremities.  Skin: No rashes, lesions, ulcers. No induration; Warm and dry.  Neurologic: CN 2-12  grossly intact with no focal deficits. Romberg sign cerebellar reflexes not assessed.  Psychiatric: Normal judgment and insight. Alert and oriented x 3. Normal mood and appropriate affect.   Data Reviewed: I have personally reviewed following labs and imaging studies  CBC:  Recent Labs Lab 04/01/17 0956 04/02/17 0440 04/03/17 1345 04/05/17 0046 04/06/17 0537  WBC 4.8 6.5 7.5 9.6 7.0  NEUTROABS 3.3 4.6 6.4  --   --   HGB 8.6* 8.6* 9.8* 9.3* 7.5*  HCT 27.4* 25.9* 29.0* 27.8* 22.7*  MCV 82.5 84.6 85.0 79.2 80.2  PLT 149* 157 180 167 240*   Basic Metabolic Panel:  Recent Labs Lab 04/02/17 0440 04/03/17 1345 04/05/17 0046 04/06/17 0537 04/06/17 1503  NA 141 138 134* 136 135  K 3.5 3.5 3.2* 3.8 4.0  CL 107 102 101 101 100*  CO2 _0 GLUCOSE 97 84 74 89 141*  BUN 23* 23* 29* 31* 34*  CREATININE 1.58* 1.77* 2.16* 1.97* 1.89*  CALCIUM 8.3* 8.5* 8.5* 7.9* 8.0*  MG 1.9  --   --   --   --   PHOS 3.2  --   --   --   --    GFR: Estimated Creatinine Clearance: 32.1 mL/min (A) (by C-G formula based on SCr of 1.89 mg/dL (H)). Liver Function Tests:  Recent Labs Lab 03/31/17 1554 04/02/17 0440  AST 27 19  ALT 52 44  ALKPHOS 55 47  BILITOT 0.4 0.5  PROT 6.5 5.7*  ALBUMIN 3.3* 2.9*    Recent Labs Lab 03/31/17 1554  LIPASE 13   No results for input(s): AMMONIA in the last 168 hours. Coagulation Profile: No results for input(s): INR, PROTIME in the last 168 hours. Cardiac Enzymes:  Recent Labs Lab 04/01/17 0459 04/05/17 0254 04/05/17 0846 04/05/17 1404 04/05/17 2039  TROPONINI 0.03* 0.05* 0.05* 0.04* 0.05*   BNP (last 3 results) No results for input(s): PROBNP in the last 8760 hours. HbA1C: No results for input(s): HGBA1C in the last 72 hours. CBG:  Recent Labs Lab 04/05/17 1707 04/05/17 2140 04/06/17 0816 04/06/17 1229 04/06/17 1809  GLUCAP 95 87 80 130* 134*   Lipid Profile: No results for input(s): CHOL, HDL, LDLCALC, TRIG, CHOLHDL,  LDLDIRECT in the last 72 hours. Thyroid Function Tests:  Recent Labs  04/05/17 0740  TSH 2.269   Anemia Panel: No results for input(s): VITAMINB12, FOLATE, FERRITIN, TIBC, IRON, RETICCTPCT in the last 72 hours. Sepsis Labs:  Recent Labs Lab 04/05/17 2239  LATICACIDVEN 0.9    Recent Results (from the past 240 hour(s))  MRSA PCR Screening     Status: None   Collection Time: 04/06/17  1:50 PM  Result Value Ref Range Status   MRSA by PCR NEGATIVE NEGATIVE Final    Comment:        The GeneXpert MRSA Assay (FDA approved for NASAL specimens only), is one component of a comprehensive MRSA colonization surveillance program. It is not intended to diagnose MRSA infection nor to guide or monitor treatment for MRSA infections.     Radiology Studies: Dg Chest 2 View  Result Date: 04/05/2017 CLINICAL DATA:  Mid chest pain since noon today with shortness of breath. History of CHF, defibrillator, cancer, coronary artery disease, diabetes, hypertension EXAM: CHEST  2 VIEW COMPARISON:  04/03/2017 FINDINGS: Cardiac pacemaker. Cardiac enlargement with pulmonary vascular congestion. Mild interstitial changes in the lungs likely representing mild edema. Perihilar infiltrates seen previously have improved. Mild blunting of costophrenic angles suggesting small effusions. Degenerative changes in the spine. IMPRESSION: Cardiac enlargement with pulmonary vascular congestion and mild interstitial edema. Small bilateral pleural effusions. Perihilar infiltrates seen previously have improved. Electronically Signed   By: Lucienne Capers M.D.   On: 04/05/2017 01:27   Scheduled Meds: . allopurinol  300 mg Oral Daily  . amiodarone  200 mg Oral Daily  . apixaban  5 mg Oral BID  . atorvastatin  40 mg Oral QHS  . carvedilol  12.5 mg Oral Q12H  . clopidogrel  75 mg Oral Daily  . feeding supplement (ENSURE ENLIVE)  237 mL Oral BID BM  . fluticasone  1 spray Each Nare QHS  . furosemide  20 mg Oral BID  .  hydrALAZINE  75 mg Oral Q6H  . insulin aspart  0-9 Units Subcutaneous TID WC  . isosorbide dinitrate  30 mg Oral TID  . latanoprost  1 drop Both Eyes QHS  . potassium chloride SA  20 mEq  Oral BID  . sodium chloride flush  3 mL Intravenous Q12H  . tamsulosin  0.4 mg Oral Daily  . vitamin B-12  1,000 mcg Oral Daily   Continuous Infusions: . ceFEPime (MAXIPIME) IV Stopped (04/06/17 1323)  . vancomycin Stopped (04/06/17 1452)    LOS: 0 days   Kerney Elbe, DO [facility] Pager [phone]  If 7PM-7AM, please contact night-coverage [email or web address] Password [facility] 04/06/2017, 6:17 PM

## 2017-04-06 NOTE — Progress Notes (Addendum)
Pharmacy Antibiotic Note  David Guerra is a 83 y.o. male admitted on 04/05/2017 with pneumonia.  Pharmacy has been consulted for vancomycin and cefepime dosing. Patient with increasing shortness of breath with a fever 5/13 hs.  Possible pneumonia on CXR.  With recent admissions, TR[facility] wishes to start vancomycin and cefepime.   Today, 04/06/2017  Renal: SCr elevated  WBC WNL  Plan:  Vancomycin 1gm IV q24h  Check trough if remains on vancomycin > 3-4 days  Check MRSA PCR to allow de-escalation  Cefepime 1gm IV q24h  Daily SCr  Monitor renal funcion  Height: 5\' 8"  (172.7 cm) Weight: 189 lb 6 oz (85.9 kg) IBW/kg (Calculated) : 68.4  Temp (24hrs), Avg:99.1 F (37.3 C), Min:98.1 F (36.7 C), Max:101 F (38.3 C)   Recent Labs Lab 04/01/17 0459 04/01/17 0956 04/02/17 0440 04/03/17 1345 04/05/17 0046 04/05/17 2239 04/06/17 0537  WBC  --  4.8 6.5 7.5 9.6  --  7.0  CREATININE 1.44*  --  1.58* 1.77* 2.16*  --  1.97*  LATICACIDVEN  --   --   --   --   --  0.9  --     Estimated Creatinine Clearance: 30.8 mL/min (A) (by C-G formula based on SCr of 1.97 mg/dL (H)).    No Known Allergies  Antimicrobials this admission: 5/14 cefepime >> 5/14 vancomycin >>  Dose adjustments this admission:  Microbiology results: 5/13 BCx:  5/14 MRSA PCR:   Thank you for allowing pharmacy to be a part of this patient's care.  Doreene Eland, PharmD, BCPS.   Pager: [phone] 04/06/2017 11:41 AM

## 2017-04-06 NOTE — Care Management Obs Status (Signed)
[place] NOTIFICATION   Patient Details  Name: David Guerra MRN: [id number] Date of Birth: 28-Sep-1933   Medicare Observation Status Notification Given:  Yes    Dessa Phi, RN 04/06/2017, 12:15 PM

## 2017-04-07 DIAGNOSIS — E1122 Type 2 diabetes mellitus with diabetic chronic kidney disease: Secondary | ICD-10-CM

## 2017-04-07 DIAGNOSIS — I2510 Atherosclerotic heart disease of native coronary artery without angina pectoris: Secondary | ICD-10-CM

## 2017-04-07 DIAGNOSIS — I252 Old myocardial infarction: Secondary | ICD-10-CM

## 2017-04-07 DIAGNOSIS — N183 Chronic kidney disease, stage 3 (moderate): Secondary | ICD-10-CM

## 2017-04-07 DIAGNOSIS — I10 Essential (primary) hypertension: Secondary | ICD-10-CM

## 2017-04-07 DIAGNOSIS — Z9861 Coronary angioplasty status: Secondary | ICD-10-CM

## 2017-04-07 DIAGNOSIS — I5043 Acute on chronic combined systolic (congestive) and diastolic (congestive) heart failure: Secondary | ICD-10-CM

## 2017-04-07 LAB — CBC WITH DIFFERENTIAL/PLATELET
BASOS ABS: 0 10*3/uL (ref 0.0–0.1)
BASOS PCT: 0 %
EOS PCT: 1 %
Eosinophils Absolute: 0.1 10*3/uL (ref 0.0–0.7)
HCT: 23.2 % — ABNORMAL LOW (ref 39.0–52.0)
Hemoglobin: 7.7 g/dL — ABNORMAL LOW (ref 13.0–17.0)
Lymphocytes Relative: 11 %
Lymphs Abs: 0.8 10*3/uL (ref 0.7–4.0)
MCH: 26.2 pg (ref 26.0–34.0)
MCHC: 33.2 g/dL (ref 30.0–36.0)
MCV: 78.9 fL (ref 78.0–100.0)
MONO ABS: 0.8 10*3/uL (ref 0.1–1.0)
MONOS PCT: 12 %
Neutro Abs: 5.4 10*3/uL (ref 1.7–7.7)
Neutrophils Relative %: 76 %
Platelets: 149 10*3/uL — ABNORMAL LOW (ref 150–400)
RBC: 2.94 MIL/uL — ABNORMAL LOW (ref 4.22–5.81)
RDW: 17.9 % — AB (ref 11.5–15.5)
WBC: 7.1 10*3/uL (ref 4.0–10.5)

## 2017-04-07 LAB — GLUCOSE, CAPILLARY
GLUCOSE-CAPILLARY: 111 mg/dL — AB (ref 65–99)
GLUCOSE-CAPILLARY: 220 mg/dL — AB (ref 65–99)
GLUCOSE-CAPILLARY: 135 mg/dL — AB (ref 65–99)
Glucose-Capillary: 165 mg/dL — ABNORMAL HIGH (ref 65–99)

## 2017-04-07 LAB — COMPREHENSIVE METABOLIC PANEL
ALBUMIN: 2.7 g/dL — AB (ref 3.5–5.0)
ALT: 103 U/L — ABNORMAL HIGH (ref 17–63)
ANION GAP: 7 (ref 5–15)
AST: 61 U/L — AB (ref 15–41)
Alkaline Phosphatase: 61 U/L (ref 38–126)
BUN: 38 mg/dL — AB (ref 6–20)
CHLORIDE: 103 mmol/L (ref 101–111)
CO2: 25 mmol/L (ref 22–32)
Calcium: 8.1 mg/dL — ABNORMAL LOW (ref 8.9–10.3)
Creatinine, Ser: 1.92 mg/dL — ABNORMAL HIGH (ref 0.61–1.24)
GFR calc Af Amer: 36 mL/min — ABNORMAL LOW (ref >60)
GFR calc non Af Amer: 31 mL/min — ABNORMAL LOW (ref >60)
GLUCOSE: 102 mg/dL — AB (ref 65–99)
POTASSIUM: 4.1 mmol/L (ref 3.5–5.1)
Sodium: 135 mmol/L (ref 135–145)
TOTAL PROTEIN: 6 g/dL — AB (ref 6.5–8.1)
Total Bilirubin: 0.8 mg/dL (ref 0.3–1.2)

## 2017-04-07 LAB — MAGNESIUM: MAGNESIUM: 2.1 mg/dL (ref 1.7–2.4)

## 2017-04-07 LAB — PHOSPHORUS: Phosphorus: 3.3 mg/dL (ref 2.5–4.6)

## 2017-04-07 NOTE — Progress Notes (Signed)
SATURATION QUALIFICATIONS: (This note is used to comply with regulatory documentation for home oxygen)    Patient Saturations on Room Air at Rest = 94%  Patient Saturations on Room Air while Ambulating = 92%  Patient Saturations on 0 Liters of oxygen while Ambulating = 92%  Please briefly explain why patient needs home oxygen: 

## 2017-04-07 NOTE — Progress Notes (Signed)
Pharmacist Heart Failure Core Measure Documentation  Assessment: David Guerra has an EF documented as 35-40% on 02/22/2017 by ECHO  Rationale: Heart failure patients with left ventricular systolic dysfunction (LVSD) and an EF < 40% should be prescribed an angiotensin converting enzyme inhibitor (ACEI) or angiotensin receptor blocker (ARB) at discharge unless a contraindication is documented in the medical record.  This patient is not currently on an ACEI or ARB for HF.  This note is being placed in the record in order to provide documentation that a contraindication to the use of these agents is present for this encounter.  ACE Inhibitor or Angiotensin Receptor Blocker is contraindicated (specify all that apply)  []   ACEI allergy AND ARB allergy []   Angioedema []   Moderate or severe aortic stenosis []   Hyperkalemia []   Hypotension []   Renal artery stenosis [x]   Worsening renal function, preexisting renal disease or dysfunction   David Guerra 04/07/2017 9:20 AM

## 2017-04-07 NOTE — Progress Notes (Signed)
PROGRESS NOTE    David Guerra  VZD:[id number] DOB: 04/15/1933 DOA: 04/05/2017 PCP: System, Pcp Not In  Brief Narrative:  David Guerra is a 83 y.o. male with known multiple myeloma, CAD s/p PCI last December and VF arrest with ICF, CHD EF 35%, IDDM, recent DVT on Eliquis, multiple recent admissions for dyspnea in April and early May this year. Just few days prior to this admission pt underwent Myoview that showed single fixed defect. Pt was just discharged home 5/10 and now presented back with same concern of persistent dyspnea. Pt explains he has had some intermittent chest discomfort, pressure like and last few minutes at this time, worse with exertion and better with rest, with no specific radiating symptoms. Pt denies fevers, chills, no specific abd or urinary concerns.  In ED, pt was hemodynamically stable, VSS, initial oxygen saturation 86% on RA but improved to 100 with 2 L [place]. Blood work notable for Hg 9.3 (was 9.8 on May 11th), Na 134, K 3.2, Cr up from recent May 11th: 1.77 -> 2.16. TRH asked to admit for observation to telemetry unit. Patient spiked a temperature overnight and because of his hypoxia and being hospitalized multiple times this month was empirically treated for an HCAP. Patient states he feels slightly better.   Assessment & Plan:   Active Problems:   Chest pain   Acute on chronic systolic and diastolic heart failure, NYHA class 4 (HCC)   Essential hypertension   CAD S/P percutaneous coronary angioplasty   Type 2 diabetes mellitus with stage 3 chronic kidney disease, without long-term current use of insulin (HCC)   CKD (chronic kidney disease), stage III   History of myocardial infarction   Atypical chest pain  Acute Respiratory Failure with Hypoxia likely from HCAP, improved -C/w Supplemental O2 via [place]; Wean as tolerated -Blood Cx x2 -CXR showed perihilar infiltrates but were improving -Patient was Febrile at 101 on the night of 5/13-5/14 and had a temp of 99.7  yesterday  -Empiric Abx with Vanc/Cefepime and de-escalate accordingly -Blood Cx x2 showed NGTD at 1 day -Patient diuresed with IV Lasix overnight of 5/13-5/14 -CXR showed infiltrates that were improving. Repeat CXR in AM -Walk Screen showed patient did not qualify for Home O2 again.   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 Small bilateral pleural effusions. Perihilar infiltrates seen previously have improved. -Diuresis limited during last hospitalization by worsening creatinine but given IV Lasix on the night of 5/13-5/14 and improved.  -LVEF 35-40 percent by echo 02/2017 -BNP was 644.2 and on 5/11 it was 639.5 -Strict I's and O's and Daily Weights. -Patient is Negative 205 mL  -C/w Furosemide 20 mg po BID for now with close monitoring of Cr.  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 In [place] -Recent Negative Stress Test on 03/29/17 -Troponin Flat at 0.05, 0.04, and 0.05 -Continue Hydralazine, Isordil, Beta Blocker and Statin -Continue Plavix, PPI -Continue Amiodarone  -Per Cards Myoview on 03/29/17 showed scar  but no ischemia -Follow up with Cardiology as an outpatient  Recent DVT -Continue Eliquis 5 mg po BID  Acute on Chronic CKD stage III -Cr went from 2.16 -> 1.97 -> 1.89 -> 1.92 -Continue to Monitor very closely as patient is being diuresed po -Repeat CMP at  SNF  Normocytic Anemia: -Baseline 9-10 -Hb/Hct was 7.5/22.7 and went to 7.7/23.2 -Continue to Monitor S/Sx of Bleeding -Repeat CMP in AM  Hx of Prostate Cancer/BPH -C/w Tamsulosin 0.4 mg po Daily  Hx of Gout -C/w with Allopurinol 300 mg po daily  DVT prophylaxis: Anticoagulated with Apixaban Code Status: FULL CODE Family Communication: No Family present at bedside Disposition Plan: Home Health PT; Supervision/Assistance -24 hour  Consultants:   None   Procedures: None   Antimicrobials:  Anti-infectives    Start     Dose/Rate Route Frequency Ordered Stop   04/06/17 1200  ceFEPIme (MAXIPIME) 1 g in dextrose 5 % 50 mL IVPB     1 g 100 mL/hr over 30 Minutes Intravenous Every 24 hours 04/06/17 1100     04/06/17 1200  vancomycin (VANCOCIN) IVPB 1000 mg/200 mL premix  Status:  Discontinued     1,000 mg 200 mL/hr over 60 Minutes Intravenous Every 24 hours 04/06/17 1100 04/07/17 1043     Subjective: Seen and examined at bedside and states he feels a little better with SOB. Per nurse observation when patient's food came this morning he became very anxious. No nausea or vomiting. Patient not tachypenic or using any accessory muscles to breathe.   Objective: Vitals:   04/06/17 2134 04/07/17 0007 04/07/17 0500 04/07/17 1230  BP: (!) 126/59 (!) 126/53 (!) 112/54 119/66  Pulse: (!) 56  60 (!) 58  Resp: 20  16 18   Temp: 99.4 F (37.4 C)  99.7 F (37.6 C) 98.5 F (36.9 C)  TempSrc: Oral  Oral Oral  SpO2: 100%  100% 98%  Weight:   86.4 kg (190 lb 7.6 oz)   Height:        Intake/Output Summary (Last 24 hours) at 04/07/17 1645 Last data filed at 04/07/17 1400  Gross per 24 hour  Intake              680 ml  Output              675 ml  Net                5 ml   Filed Weights   04/05/17 0659 04/06/17 0539 04/07/17 0500  Weight: 85.9 kg (189 lb 6 oz) 85.9 kg (189 lb 6 oz) 86.4 kg (190 lb 7.6 oz)   Examination: Physical Exam:  Constitutional: Patient is a 29 AAM who is  in NAD Eyes: Sclerae anicteric. Lids normal.  ENMT: External Ears and Nose appear normal. Grossly normal hearing.  Neck: Supple. No thyromegaly and no appreciable JVD Respiratory: Diminished breath sounds with mild crackles. No wheezing/rales/rhonchi. Patient not tachypenic or using accessory muscles to breathe Cardiovascular: Slightly bradycardic rate but regular rhythm. No appreciable extremity edema Abdomen: Soft, NT, ND. Bowel sounds present GU: Deferred Musculoskeletal: No contractures. No cyanosis Skin: Warm and dry. No rashes or lesions on limited skin eval Neurologic: CN 2-12 grossly intact. No focal deficits Psychiatric: Pleasant but anxious mood and affect. Normal judgment and insight.  Data Reviewed: I have personally reviewed following labs and imaging studies  CBC:  Recent Labs Lab 04/01/17 0956 04/02/17 0440 04/03/17 1345 04/05/17 0046 04/06/17 0537 04/07/17 0607  WBC  4.8 6.5 7.5 9.6 7.0 7.1  NEUTROABS 3.3 4.6 6.4  --   --  5.4  HGB 8.6* 8.6* 9.8* 9.3* 7.5* 7.7*  HCT 27.4* 25.9* 29.0* 27.8* 22.7* 23.2*  MCV 82.5 84.6 85.0 79.2 80.2 78.9  PLT 149* 157 180 167 140* 440*   Basic Metabolic Panel:  Recent Labs Lab 04/02/17 0440 04/03/17 1345 04/05/17 0046 04/06/17 0537 04/06/17 1503 04/07/17 0607  NA 141 138 134* 136 135 135  K 3.5 3.5 3.2* 3.8 4.0 4.1  CL 107 102 101 101 100* 103  CO2 29 26 24 26 28 25   GLUCOSE 97 84 74 89 141* 102*  BUN 23* 23* 29* 31* 34* 38*  CREATININE 1.58* 1.77* 2.16* 1.97* 1.89* 1.92*  CALCIUM 8.3* 8.5* 8.5* 7.9* 8.0* 8.1*  MG 1.9  --   --   --   --  2.1  PHOS 3.2  --   --   --   --  3.3   GFR: Estimated Creatinine Clearance: 31.7 mL/min (A) (by C-G formula based on SCr of 1.92 mg/dL (H)). Liver Function Tests:  Recent Labs Lab 04/02/17 0440 04/07/17 0607  AST 19 61*  ALT 44 103*  ALKPHOS 47 61  BILITOT 0.5 0.8  PROT 5.7* 6.0*  ALBUMIN 2.9* 2.7*   No results for input(s): LIPASE, AMYLASE in the last 168 hours. No  results for input(s): AMMONIA in the last 168 hours. Coagulation Profile: No results for input(s): INR, PROTIME in the last 168 hours. Cardiac Enzymes:  Recent Labs Lab 04/01/17 0459 04/05/17 0254 04/05/17 0846 04/05/17 1404 04/05/17 2039  TROPONINI 0.03* 0.05* 0.05* 0.04* 0.05*   BNP (last 3 results) No results for input(s): PROBNP in the last 8760 hours. HbA1C: No results for input(s): HGBA1C in the last 72 hours. CBG:  Recent Labs Lab 04/06/17 1229 04/06/17 1809 04/06/17 2135 04/07/17 0733 04/07/17 1138  GLUCAP 130* 134* 157* 111* 220*   Lipid Profile: No results for input(s): CHOL, HDL, LDLCALC, TRIG, CHOLHDL, LDLDIRECT in the last 72 hours. Thyroid Function Tests:  Recent Labs  04/05/17 0740  TSH 2.269   Anemia Panel: No results for input(s): VITAMINB12, FOLATE, FERRITIN, TIBC, IRON, RETICCTPCT in the last 72 hours. Sepsis Labs:  Recent Labs Lab 04/05/17 2239  LATICACIDVEN 0.9    Recent Results (from the past 240 hour(s))  Culture, blood (routine x 2)     Status: None (Preliminary result)   Collection Time: 04/05/17 10:39 PM  Result Value Ref Range Status   Specimen Description BLOOD LEFT ARM  Final   Special Requests   Final    BOTTLES DRAWN AEROBIC AND ANAEROBIC Blood Culture adequate volume   Culture   Final    NO GROWTH 1 DAY Performed at [facility], [place] [address] [place], [place] [address]    Report Status PENDING  Incomplete  Culture, blood (routine x 2)     Status: None (Preliminary result)   Collection Time: 04/05/17 10:39 PM  Result Value Ref Range Status   Specimen Description BLOOD RIGHT ANTECUBITAL  Final   Special Requests   Final    BOTTLES DRAWN AEROBIC AND ANAEROBIC Blood Culture adequate volume   Culture   Final    NO GROWTH 1 DAY Performed at [facility], [place] [address], [place], [place] [address]    Report Status PENDING  Incomplete  MRSA PCR Screening     Status: None   Collection Time: 04/06/17  1:50 PM   Result  Value Ref Range Status   MRSA by PCR NEGATIVE NEGATIVE Final    Comment:        The GeneXpert MRSA Assay (FDA approved for NASAL specimens only), is one component of a comprehensive MRSA colonization surveillance program. It is not intended to diagnose MRSA infection nor to guide or monitor treatment for MRSA infections.     Radiology Studies: No results found. Scheduled Meds: . allopurinol  300 mg Oral Daily  . amiodarone  200 mg Oral Daily  . apixaban  5 mg Oral BID  . atorvastatin  40 mg Oral QHS  . carvedilol  12.5 mg Oral Q12H  . clopidogrel  75 mg Oral Daily  . feeding supplement (ENSURE ENLIVE)  237 mL Oral BID BM  . fluticasone  1 spray Each Nare QHS  . furosemide  20 mg Oral BID  . hydrALAZINE  75 mg Oral Q6H  . insulin aspart  0-9 Units Subcutaneous TID WC  . isosorbide dinitrate  30 mg Oral TID  . latanoprost  1 drop Both Eyes QHS  . potassium chloride SA  20 mEq Oral BID  . sodium chloride flush  3 mL Intravenous Q12H  . tamsulosin  0.4 mg Oral Daily  . vitamin B-12  1,000 mcg Oral Daily   Continuous Infusions: . ceFEPime (MAXIPIME) IV Stopped (04/07/17 1230)    LOS: 1 day   Kerney Elbe, DO [facility] Pager [phone]  If 7PM-7AM, please contact night-coverage [email or web address] Password [facility] 04/07/2017, 4:45 PM

## 2017-04-07 NOTE — Care Management Note (Signed)
Case Management Note  Patient Details  Name: David Guerra MRN: [id number] Date of Birth: [birth year]  Subjective/Objective: Noted home 02 not needed. Patient's pcp in [place]. PT recc HHPT. Per patient he plans to fly back to [facility] @ d/c-informed patient he will have to set up HHPT w/his pcp, unless he stays in [place] with dtr-then we can set it up in [place]-patient voiced understanding.                   Action/Plan:d/c plan home.   Expected Discharge Date:                  Expected Discharge Plan:  [place]  In-House Referral:     Discharge planning Services  CM Consult  Post Acute Care Choice:    Choice offered to:  Patient  DME Arranged:    DME Agency:     HH Arranged:    [place] Agency:     Status of Service:  In process, will continue to follow  If discussed at Long Length of Stay Meetings, dates discussed:    Additional Comments:  Dessa Phi, RN 04/07/2017, 3:11 PM

## 2017-04-08 ENCOUNTER — Inpatient Hospital Stay (HOSPITAL_COMMUNITY): Payer: Medicare Other

## 2017-04-08 DIAGNOSIS — D649 Anemia, unspecified: Secondary | ICD-10-CM

## 2017-04-08 LAB — CBC WITH DIFFERENTIAL/PLATELET
BASOS PCT: 0 %
Basophils Absolute: 0 10*3/uL (ref 0.0–0.1)
EOS ABS: 0.1 10*3/uL (ref 0.0–0.7)
EOS PCT: 1 %
HEMATOCRIT: 22.9 % — AB (ref 39.0–52.0)
Hemoglobin: 7.4 g/dL — ABNORMAL LOW (ref 13.0–17.0)
Lymphocytes Relative: 13 %
Lymphs Abs: 0.9 10*3/uL (ref 0.7–4.0)
MCH: 25.5 pg — ABNORMAL LOW (ref 26.0–34.0)
MCHC: 32.3 g/dL (ref 30.0–36.0)
MCV: 79 fL (ref 78.0–100.0)
MONOS PCT: 10 %
Monocytes Absolute: 0.7 10*3/uL (ref 0.1–1.0)
Neutro Abs: 5 10*3/uL (ref 1.7–7.7)
Neutrophils Relative %: 76 %
PLATELETS: 163 10*3/uL (ref 150–400)
RBC: 2.9 MIL/uL — ABNORMAL LOW (ref 4.22–5.81)
RDW: 17.6 % — AB (ref 11.5–15.5)
WBC: 6.7 10*3/uL (ref 4.0–10.5)

## 2017-04-08 LAB — ABO/RH: ABO/RH(D): B POS

## 2017-04-08 LAB — PHOSPHORUS: Phosphorus: 2.8 mg/dL (ref 2.5–4.6)

## 2017-04-08 LAB — COMPREHENSIVE METABOLIC PANEL
ALBUMIN: 2.5 g/dL — AB (ref 3.5–5.0)
ALT: 132 U/L — ABNORMAL HIGH (ref 17–63)
ANION GAP: 7 (ref 5–15)
AST: 77 U/L — ABNORMAL HIGH (ref 15–41)
Alkaline Phosphatase: 60 U/L (ref 38–126)
BILIRUBIN TOTAL: 0.7 mg/dL (ref 0.3–1.2)
BUN: 35 mg/dL — ABNORMAL HIGH (ref 6–20)
CHLORIDE: 103 mmol/L (ref 101–111)
CO2: 25 mmol/L (ref 22–32)
Calcium: 8.2 mg/dL — ABNORMAL LOW (ref 8.9–10.3)
Creatinine, Ser: 1.83 mg/dL — ABNORMAL HIGH (ref 0.61–1.24)
GFR calc Af Amer: 38 mL/min — ABNORMAL LOW (ref >60)
GFR calc non Af Amer: 33 mL/min — ABNORMAL LOW (ref >60)
GLUCOSE: 102 mg/dL — AB (ref 65–99)
POTASSIUM: 4 mmol/L (ref 3.5–5.1)
SODIUM: 135 mmol/L (ref 135–145)
TOTAL PROTEIN: 5.8 g/dL — AB (ref 6.5–8.1)

## 2017-04-08 LAB — GLUCOSE, CAPILLARY
GLUCOSE-CAPILLARY: 98 mg/dL (ref 65–99)
GLUCOSE-CAPILLARY: 147 mg/dL — AB (ref 65–99)
GLUCOSE-CAPILLARY: 130 mg/dL — AB (ref 65–99)
Glucose-Capillary: 117 mg/dL — ABNORMAL HIGH (ref 65–99)

## 2017-04-08 LAB — PREPARE RBC (CROSSMATCH)

## 2017-04-08 LAB — MAGNESIUM: Magnesium: 1.9 mg/dL (ref 1.7–2.4)

## 2017-04-08 MED ORDER — HYDRALAZINE HCL 50 MG PO TABS
50.0000 mg | ORAL_TABLET | Freq: Four times a day (QID) | ORAL | Status: DC
  Administered 2017-04-08 – 2017-04-09 (×5): 50 mg via ORAL
  Filled 2017-04-08 (×5): qty 1

## 2017-04-08 MED ORDER — AMOXICILLIN-POT CLAVULANATE 500-125 MG PO TABS
1.0000 | ORAL_TABLET | Freq: Two times a day (BID) | ORAL | Status: DC
  Administered 2017-04-08 – 2017-04-09 (×3): 500 mg via ORAL
  Filled 2017-04-08 (×4): qty 1

## 2017-04-08 MED ORDER — SENNOSIDES-DOCUSATE SODIUM 8.6-50 MG PO TABS
1.0000 | ORAL_TABLET | Freq: Two times a day (BID) | ORAL | Status: DC
  Administered 2017-04-08 – 2017-04-09 (×3): 1 via ORAL
  Filled 2017-04-08 (×3): qty 1

## 2017-04-08 MED ORDER — SODIUM CHLORIDE 0.9 % IV SOLN
Freq: Once | INTRAVENOUS | Status: AC
  Administered 2017-04-08: 14:00:00 via INTRAVENOUS

## 2017-04-08 MED ORDER — FUROSEMIDE 10 MG/ML IJ SOLN
40.0000 mg | Freq: Once | INTRAMUSCULAR | Status: AC
  Administered 2017-04-08: 40 mg via INTRAVENOUS
  Filled 2017-04-08: qty 4

## 2017-04-08 MED ORDER — FUROSEMIDE 10 MG/ML IJ SOLN
INTRAMUSCULAR | Status: AC
  Filled 2017-04-08: qty 4

## 2017-04-08 MED ORDER — FUROSEMIDE 40 MG PO TABS
40.0000 mg | ORAL_TABLET | Freq: Two times a day (BID) | ORAL | Status: DC
  Administered 2017-04-08 – 2017-04-09 (×3): 40 mg via ORAL
  Filled 2017-04-08 (×3): qty 1

## 2017-04-08 NOTE — Progress Notes (Signed)
Physical Therapy Treatment Patient Details Name: David Guerra MRN: [id number] DOB: 21-Sep-1933 Today's Date: 04/08/2017    History of Present Illness 83 y.o. male w/ hx M 12/2017I>>stents, CHF, AICD, MML, DM, HTN, HLD, visiting here from [place], developed worsening SOB and was admitted 04/05/17    PT Comments    Progressing slowly with mobility. Pt reports feeling of general malaise during session today. He participated fairly well. Will continue to follow.    Follow Up Recommendations  Home health PT;Supervision/Assistance - 24 hour     Equipment Recommendations  None recommended by PT    Recommendations for Other Services       Precautions / Restrictions Precautions Precautions: Fall Precaution Comments: monitor sats Restrictions Weight Bearing Restrictions: No    Mobility  Bed Mobility Overal bed mobility: Modified Independent                Transfers Overall transfer level: Needs assistance Equipment used: Straight cane Transfers: Sit to/from Stand Sit to Stand: Min guard         General transfer comment: close guard for safety.   Ambulation/Gait Ambulation/Gait assistance: Min guard Ambulation Distance (Feet): 115 Feet Assistive device: Straight cane Gait Pattern/deviations: Step-through pattern;Decreased stride length     General Gait Details: very close guarding. O2 sats >90% on RA, dyspnea 2/4 during ambulation. Pt fatigues fairly easily.    Stairs            Wheelchair Mobility    Modified Rankin (Stroke Patients Only)       Balance                                            Cognition Arousal/Alertness: Awake/alert Behavior During Therapy: WFL for tasks assessed/performed Overall Cognitive Status: Within Functional Limits for tasks assessed                                        Exercises      General Comments        Pertinent Vitals/Pain Pain Assessment: No/denies pain    Home Living                       Prior Function            PT Goals (current goals can now be found in the care plan section) Progress towards PT goals: Progressing toward goals    Frequency    Min 3X/week      PT Plan      Co-evaluation              AM-PAC PT "6 Clicks" Daily Activity  Outcome Measure  Difficulty turning over in bed (including adjusting bedclothes, sheets and blankets)?: None Difficulty moving from lying on back to sitting on the side of the bed? : None Difficulty sitting down on and standing up from a chair with arms (e.g., wheelchair, bedside commode, etc,.)?: A Little Help needed moving to and from a bed to chair (including a wheelchair)?: A Little Help needed walking in hospital room?: A Little Help needed climbing 3-5 steps with a railing? : A Little 6 Click Score: 20    End of Session Equipment Utilized During Treatment: Gait belt Activity Tolerance: Patient limited by fatigue Patient left: in chair;with call  bell/phone within reach   PT Visit Diagnosis: Muscle weakness (generalized) (M62.81);Difficulty in walking, not elsewhere classified (R26.2)     Time: 6789-3810 PT Time Calculation (min) (ACUTE ONLY): 13 min  Charges:  $Gait Training: 8-22 mins                    G Codes:          Weston Anna, MPT Pager: [phone]

## 2017-04-08 NOTE — Progress Notes (Signed)
PROGRESS NOTE    Sang Blount  KXF:[id number] DOB: 1932/12/12 DOA: 04/05/2017 PCP: System, Pcp Not In  Brief Narrative:  David Guerra is a 83 y.o. male with known multiple myeloma, CAD s/p PCI last December and VF arrest with ICF, CHD EF 35%, IDDM, recent DVT on Eliquis, multiple recent admissions for dyspnea in April and early May this year. Just few days prior to this admission pt underwent Myoview that showed single fixed defect. Pt was just discharged home 5/10 and now presented back with same concern of persistent dyspnea. Pt explains he has had some intermittent chest discomfort, pressure like and last few minutes at this time, worse with exertion and better with rest, with no specific radiating symptoms. Pt denies fevers, chills, no specific abd or urinary concerns.  In ED, pt was hemodynamically stable, VSS, initial oxygen saturation 86% on RA but improved to 100 with 2 L [place]. Blood work notable for Hg 9.3 (was 9.8 on May 11th), Na 134, K 3.2, Cr up from recent May 11th: 1.77 -> 2.16. TRH asked to admit for observation to telemetry unit. Patient spiked a temperature overnight and because of his hypoxia and being hospitalized multiple times this month was empirically treated for an HCAP. Patient states he feels slightly better.   Assessment & Plan:   Active Problems:   Chest pain   Acute on chronic systolic and diastolic heart failure, NYHA class 4 (HCC)   Essential hypertension   CAD S/P percutaneous coronary angioplasty   Type 2 diabetes mellitus with stage 3 chronic kidney disease, without long-term current use of insulin (HCC)   CKD (chronic kidney disease), stage III   History of myocardial infarction   Atypical chest pain  Acute Respiratory Failure with Hypoxia ( required 3liter oxygen)/dyspnea -he report chronic dyspnea even before his heart attach in 11/2016, this is his 5th hospitalization at [facility] since 02/2017, he was also hospitalized at [facility] in 4/2018x1, he  was hospitalized twice in January in [place] -likely multifactorial, including chf, pulmonary hypertension,  Deconditioning, anemia, +/_ pneumonia, he did have one time fever of 101  (on the night of 5/13-5/14),  but he denies cough, no leukocytosis, cxr on 5/13 :Cardiac enlargement with pulmonary vascular congestion and mild interstitial edema. Small bilateral pleural effusions. Perihilar infiltrates seen previously have improved --Blood Cx x2 negative, mrsa screening negative, he does not cough - she received Empiric Abx with Vanc/Cefepime initially, change to oral augmentin on 5/16 -continue diuresis, prbc transfusion, patient has also been on amiodarone ( he does not know the reason for it) will d/c due to pulmonary side effect -ambulate, wean oxygen  Acute on Chronic combined Systolic and DiastolicCHF: --CXR showed Cardiac enlargement with pulmonary vascular congestion and mild interstitial edema. Small bilateral pleural effusions. Perihilar infiltrates seen previously have improved. -LVEF 35-40 percent by echo 02/2017 -BNP was 644.2 and on 5/11 it was 639.5, troponin mild and flat -Strict I's and O's and Daily Weights. -Patient is Negative 914m  -change lasix to iv,   -Follow up with Cardiology as an outpatient  Elevated lft: from liver congestion? Amiodarone? Statin Due to recent stent, will continue statin, will d/c amiodarone. Increase lasix,  Repeat lft in am   Coronary Artery Disease/Ischemic Cardiomyopathy s/pVF arrestwith ICD: -Recent ICD placement and [place] Negative Stress Test on 03/29/17, Per Cards Myoview on 03/29/17 showed scar but no ischemia -Troponin Flat at 0.05, 0.04, and 0.05 -Continue Hydralazine, Isordil, Beta Blocker ,  Statin,  Plavix, -d/c  Amiodarone due to chronic dyspnea --Follow up with Cardiology as an outpatient  Hypertension -Blood Pressure at Goal -C/w Carvedilol 12.5 mg po bid, hydralazine ( decrease from 75tid to  50 tid) , with Isosorbide Dinitrate 30 mg TID, increase lasix to iv 55m bid   [facility], [facility] -CTA no PE on 5/8, Continue Eliquis 5 mg po BID Monitor h/h  noninsulin dependent/ 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 -a1c 5.7 in 03/2017 -Glipizide Held -Had Sensitive Novolog SSI with meals and Bedtime while Hospitalized  -CBG's ranged from 111-220-   Chronic CKD stage III -Cr  2.16 -> 1.97 -> 1.89 -> 1.92 --ua no infection, no blood, no protein, renal u[place]oreapending Per daughter, patient required short term dialysis in 11/2016  Normocytic Anemia: which could contribute to dyspnea -Baseline 9-10, -Hb/Hct was 7.5/22.7 and went to 7.7/23.2 -ua no blood, FOBT pending collection ( he report polypectomy in 2017, denies blood in stool),  -prbc x1unit on 5/16 with lasix 427miv x1,  -He also has h/o multiple myeloma, recent CTA chest on 5/8 concerning "Diffuse osteopenia, consistent with multiple myeloma, suggesting diffuse marrow involvement." -patient does has elevated cr, but calcium seems ok, will get spep/upep  H/o multiple myeloma: Unknown detail, due to droping h/h, will get upep/spep  Hx of Prostate Cancer/BPH -C/w Tamsulosin 0.4 mg po Daily  Hx of Gout -C/w with Allopurinol 300 mg po daily  FTT: get PT eval  DVT prophylaxis: Anticoagulated with Apixaban Code Status: FULL CODE Family Communication: daughter over the phone [place] Plan: [place]; Supervision/Assistance -24 hour  Consultants:   None   Procedures: None   Antimicrobials:  Anti-infectives    Start     Dose/Rate Route Frequency Ordered Stop   04/08/17 1430  amoxicillin-clavulanate (AUGMENTIN) 500-125 MG per tablet 500 mg     1 tablet Oral 2 times daily 04/08/17 1407     04/06/17 1200  ceFEPIme (MAXIPIME) 1 g in dextrose 5 % 50 mL IVPB  Status:  Discontinued     1 g 100 mL/hr over 30 Minutes Intravenous Every 24  hours 04/06/17 1100 04/08/17 1404   04/06/17 1200  vancomycin (VANCOCIN) IVPB 1000 mg/200 mL premix  Status:  Discontinued     1,000 mg 200 mL/hr over 60 Minutes Intravenous Every 24 hours 04/06/17 1100 04/07/17 1043     Subjective: Denies chest pain, on room air at rest, no fever, no cough.   Objective: Vitals:   04/07/17 1230 04/07/17 2133 04/08/17 0433 04/08/17 1500  BP: 119/66 132/63 (!) 133/58 130/60  Pulse: (!) 58 60 60 60  Resp: 18 16 16 16   Temp: 98.5 F (36.9 C) 98.7 F (37.1 C) 98.9 F (37.2 C) 98.7 F (37.1 C)  TempSrc: Oral Oral Oral Oral  SpO2: 98% 98% 96% 98%  Weight:   88.1 kg (194 lb 3.6 oz)   Height:        Intake/Output Summary (Last 24 hours) at 04/08/17 1509 Last data filed at 04/08/17 1300  Gross per 24 hour  Intake              360 ml  Output             1075 ml  Net             -715 ml   Filed Weights   04/06/17 0539 04/07/17 0500 04/08/17 0433  Weight: 85.9 kg (189  lb 6 oz) 86.4 kg (190 lb 7.6 oz) 88.1 kg (194 lb 3.6 oz)   Examination: Physical Exam:  Constitutional: Patient is a 66 AAM who is in NAD Eyes: Sclerae anicteric. Lids normal.  Respiratory: respiratory effort wnl,  no wheezing, no rhonchi, mild bibasilar crackles Cardiovascular: paced rhythm Abdomen: Soft, NT, ND. Bowel sounds present Musculoskeletal: No contractures. No cyanosis, right lower extremity edema Skin: Warm and dry. No rashes or lesions on limited skin eval Neurologic: CN 2-12 grossly intact. No focal deficits Psychiatric: intermittent anxious   Data Reviewed: I have personally reviewed following labs and imaging studies  CBC:  Recent Labs Lab 04/02/17 0440 04/03/17 1345 04/05/17 0046 04/06/17 0537 04/07/17 0607 04/08/17 0631  WBC 6.5 7.5 9.6 7.0 7.1 6.7  NEUTROABS 4.6 6.4  --   --  5.4 5.0  HGB 8.6* 9.8* 9.3* 7.5* 7.7* 7.4*  HCT 25.9* 29.0* 27.8* 22.7* 23.2* 22.9*  MCV 84.6 85.0 79.2 80.2 78.9 79.0  PLT 157 180 167 140* 149* 016   Basic Metabolic  Panel:  Recent Labs Lab 04/02/17 0440  04/05/17 0046 04/06/17 0537 04/06/17 1503 04/07/17 0607 04/08/17 0631  NA 141  < > 134* 136 135 135 135  K 3.5  < > 3.2* 3.8 4.0 4.1 4.0  CL 107  < > 101 101 100* 103 103  CO2 29  < > 24 26 28 25 25   GLUCOSE 97  < > 74 89 141* 102* 102*  BUN 23*  < > 29* 31* 34* 38* 35*  CREATININE 1.58*  < > 2.16* 1.97* 1.89* 1.92* 1.83*  CALCIUM 8.3*  < > 8.5* 7.9* 8.0* 8.1* 8.2*  MG 1.9  --   --   --   --  2.1 1.9  PHOS 3.2  --   --   --   --  3.3 2.8  < > = values in this interval not displayed. GFR: Estimated Creatinine Clearance: 33.6 mL/min (A) (by C-G formula based on SCr of 1.83 mg/dL (H)). Liver Function Tests:  Recent Labs Lab 04/02/17 0440 04/07/17 0607 04/08/17 0631  AST 19 61* 77*  ALT 44 103* 132*  ALKPHOS 47 61 60  BILITOT 0.5 0.8 0.7  PROT 5.7* 6.0* 5.8*  ALBUMIN 2.9* 2.7* 2.5*   No results for input(s): LIPASE, AMYLASE in the last 168 hours. No results for input(s): AMMONIA in the last 168 hours. Coagulation Profile: No results for input(s): INR, PROTIME in the last 168 hours. Cardiac Enzymes:  Recent Labs Lab 04/05/17 0254 04/05/17 0846 04/05/17 1404 04/05/17 2039  TROPONINI 0.05* 0.05* 0.04* 0.05*   BNP (last 3 results) No results for input(s): PROBNP in the last 8760 hours. HbA1C: No results for input(s): HGBA1C in the last 72 hours. CBG:  Recent Labs Lab 04/07/17 1138 04/07/17 1713 04/07/17 2133 04/08/17 0744 04/08/17 1128  GLUCAP 220* 165* 135* 98 147*   Lipid Profile: No results for input(s): CHOL, HDL, LDLCALC, TRIG, CHOLHDL, LDLDIRECT in the last 72 hours. Thyroid Function Tests: No results for input(s): TSH, T4TOTAL, FREET4, T3FREE, THYROIDAB in the last 72 hours. Anemia Panel: No results for input(s): VITAMINB12, FOLATE, FERRITIN, TIBC, IRON, RETICCTPCT in the last 72 hours. Sepsis Labs:  Recent Labs Lab 04/05/17 2239  LATICACIDVEN 0.9    Recent Results (from the past 240 hour(s))    Culture, blood (routine x 2)     Status: None (Preliminary result)   Collection Time: 04/05/17 10:39 PM  Result Value Ref Range Status   Specimen  Description BLOOD LEFT ARM  Final   Special Requests   Final    BOTTLES DRAWN AEROBIC AND ANAEROBIC Blood Culture adequate volume   Culture   Final    NO GROWTH 1 DAY Performed at [facility], [address], [place], [place] [address]    Report Status PENDING  Incomplete  Culture, blood (routine x 2)     Status: None (Preliminary result)   Collection Time: 04/05/17 10:39 PM  Result Value Ref Range Status   Specimen Description BLOOD RIGHT ANTECUBITAL  Final   Special Requests   Final    BOTTLES DRAWN AEROBIC AND ANAEROBIC Blood Culture adequate volume   Culture   Final    NO GROWTH 1 DAY Performed at [facility], [place] [address], [place], [place] [address]    Report Status PENDING  Incomplete  MRSA PCR Screening     Status: None   Collection Time: 04/06/17  1:50 PM  Result Value Ref Range Status   MRSA by PCR NEGATIVE NEGATIVE Final    Comment:        The GeneXpert MRSA Assay (FDA approved for NASAL specimens only), is one component of a comprehensive MRSA colonization surveillance program. It is not intended to diagnose MRSA infection nor to guide or monitor treatment for MRSA infections.     Radiology Studies: US Renal  Result Date: 04/08/2017 CLINICAL DATA:  Elevated creatinine. History of chronic renal insufficiency, hypertension, and diabetes. EXAM: RENAL / URINARY TRACT ULTRASOUND COMPLETE COMPARISON:  Limited views of the abdomen from a chest CT scan of 31 Mar 2017. FINDINGS: Right Kidney: Length: 11.5 cm. The renal cortical echotexture is approximately equal to that of the adjacent liver. There is a parapelvic cyst measuring 3.9 x 4.3 x 3.9 cm. There is no hydronephrosis. Left Kidney: Length: 10.9 cm. The renal cortical echotexture is increased similar to that on the right. There is no hydronephrosis. Bladder:  Appears normal for degree of bladder distention. There are bilateral pleural effusions. IMPRESSION: Increased renal cortical echotexture bilaterally consistent with medical renal disease. There is no hydronephrosis. There is a parapelvic cyst on the right measuring up to 4.3 cm in greatest dimension. Normal appearance of the urinary bladder. Small bilateral pleural effusions. Electronically Signed   By: David  Martinique M.D.   On: 04/08/2017 14:06   Scheduled Meds: . allopurinol  300 mg Oral Daily  . amoxicillin-clavulanate  1 tablet Oral BID  . apixaban  5 mg Oral BID  . atorvastatin  40 mg Oral QHS  . carvedilol  12.5 mg Oral Q12H  . clopidogrel  75 mg Oral Daily  . feeding supplement (ENSURE ENLIVE)  237 mL Oral BID BM  . fluticasone  1 spray Each Nare QHS  . furosemide  40 mg Intravenous Once  . furosemide  40 mg Oral BID  . hydrALAZINE  50 mg Oral Q6H  . insulin aspart  0-9 Units Subcutaneous TID WC  . isosorbide dinitrate  30 mg Oral TID  . latanoprost  1 drop Both Eyes QHS  . potassium chloride SA  20 mEq Oral BID  . senna-docusate  1 tablet Oral BID  . sodium chloride flush  3 mL Intravenous Q12H  . tamsulosin  0.4 mg Oral Daily  . vitamin B-12  1,000 mcg Oral Daily   Continuous Infusions:   LOS: 2 days   Time spent> 30mns  Olander Friedl, MD PhD [facility] Pager [phone] If 7PM-7AM, please contact night-coverage [email or web address] Password  [email or web address] 04/08/2017, 3:09 PM

## 2017-04-09 ENCOUNTER — Inpatient Hospital Stay (HOSPITAL_COMMUNITY): Payer: Medicare Other

## 2017-04-09 ENCOUNTER — Institutional Professional Consult (permissible substitution): Payer: Medicare Other | Admitting: Internal Medicine

## 2017-04-09 DIAGNOSIS — E119 Type 2 diabetes mellitus without complications: Secondary | ICD-10-CM

## 2017-04-09 LAB — BPAM RBC
Blood Product Expiration Date: 201806202359
ISSUE DATE / TIME: 201805161751
UNIT TYPE AND RH: 7300

## 2017-04-09 LAB — COMPREHENSIVE METABOLIC PANEL
ALT: 167 U/L — AB (ref 17–63)
ANION GAP: 9 (ref 5–15)
AST: 84 U/L — AB (ref 15–41)
Albumin: 2.6 g/dL — ABNORMAL LOW (ref 3.5–5.0)
Alkaline Phosphatase: 66 U/L (ref 38–126)
BILIRUBIN TOTAL: 0.6 mg/dL (ref 0.3–1.2)
BUN: 31 mg/dL — ABNORMAL HIGH (ref 6–20)
CO2: 26 mmol/L (ref 22–32)
Calcium: 8.3 mg/dL — ABNORMAL LOW (ref 8.9–10.3)
Chloride: 103 mmol/L (ref 101–111)
Creatinine, Ser: 1.78 mg/dL — ABNORMAL HIGH (ref 0.61–1.24)
GFR, EST AFRICAN AMERICAN: 39 mL/min — AB (ref >60)
GFR, EST NON AFRICAN AMERICAN: 34 mL/min — AB (ref >60)
GLUCOSE: 100 mg/dL — AB (ref 65–99)
POTASSIUM: 3.6 mmol/L (ref 3.5–5.1)
Sodium: 138 mmol/L (ref 135–145)
Total Protein: 6 g/dL — ABNORMAL LOW (ref 6.5–8.1)

## 2017-04-09 LAB — CBC WITH DIFFERENTIAL/PLATELET
Basophils Absolute: 0 10*3/uL (ref 0.0–0.1)
Basophils Relative: 0 %
Eosinophils Absolute: 0.1 10*3/uL (ref 0.0–0.7)
Eosinophils Relative: 2 %
HEMATOCRIT: 25.9 % — AB (ref 39.0–52.0)
HEMOGLOBIN: 8.6 g/dL — AB (ref 13.0–17.0)
LYMPHS PCT: 14 %
Lymphs Abs: 0.9 10*3/uL (ref 0.7–4.0)
MCH: 26.4 pg (ref 26.0–34.0)
MCHC: 33.2 g/dL (ref 30.0–36.0)
MCV: 79.4 fL (ref 78.0–100.0)
MONO ABS: 0.7 10*3/uL (ref 0.1–1.0)
Monocytes Relative: 11 %
NEUTROS ABS: 4.6 10*3/uL (ref 1.7–7.7)
NEUTROS PCT: 73 %
Platelets: 173 10*3/uL (ref 150–400)
RBC: 3.26 MIL/uL — AB (ref 4.22–5.81)
RDW: 17.1 % — AB (ref 11.5–15.5)
WBC: 6.2 10*3/uL (ref 4.0–10.5)

## 2017-04-09 LAB — TYPE AND SCREEN
ABO/RH(D): B POS
ANTIBODY SCREEN: NEGATIVE
Unit division: 0

## 2017-04-09 LAB — OCCULT BLOOD X 1 CARD TO LAB, STOOL: Fecal Occult Bld: NEGATIVE

## 2017-04-09 LAB — GLUCOSE, CAPILLARY
GLUCOSE-CAPILLARY: 116 mg/dL — AB (ref 65–99)
GLUCOSE-CAPILLARY: 157 mg/dL — AB (ref 65–99)
Glucose-Capillary: 86 mg/dL (ref 65–99)

## 2017-04-09 MED ORDER — AMOXICILLIN-POT CLAVULANATE 500-125 MG PO TABS: 1.0000 | ORAL_TABLET | Freq: Two times a day (BID) | ORAL | 0 refills | Status: AC

## 2017-04-09 MED ORDER — FUROSEMIDE 80 MG PO TABS: 80.0000 mg | ORAL_TABLET | Freq: Every day | ORAL | 0 refills | Status: AC

## 2017-04-09 MED ORDER — FUROSEMIDE 40 MG PO TABS: 80.0000 mg | ORAL_TABLET | ORAL | 0 refills | Status: DC

## 2017-04-09 MED ORDER — FUROSEMIDE 10 MG/ML IJ SOLN
40.0000 mg | Freq: Once | INTRAMUSCULAR | Status: AC
Start: 2017-04-09 — End: 2017-04-09
  Administered 2017-04-09: 40 mg via INTRAVENOUS
  Filled 2017-04-09: qty 4

## 2017-04-09 MED ORDER — FUROSEMIDE 40 MG PO TABS: 40.0000 mg | ORAL_TABLET | Freq: Every day | ORAL | 0 refills | Status: AC

## 2017-04-09 NOTE — Progress Notes (Signed)
D/c to home.D/c instructions given w/ verbal understanding and awaiting ride

## 2017-04-09 NOTE — Progress Notes (Signed)
SATURATION QUALIFICATIONS: (This note is used to comply with regulatory documentation for home oxygen) ° °Patient Saturations on Room Air at Rest = 93% ° °Patient Saturations on Room Air while Ambulating = 94% ° °Patient Saturations on 0 Liters of oxygen while Ambulating = 94% ° °Please briefly explain why patient needs home oxygen: °

## 2017-04-09 NOTE — Discharge Summary (Signed)
Discharge Summary  Legacy Lacivita NWG:[id number] DOB: [birth year]  PCP: System, Pcp Not In  Admit date: 04/05/2017 Discharge date: 04/09/2017  Time spent: >80mns  Recommendations for Outpatient Follow-up:  1. F/u with PMD within a week  for hospital discharge follow up, repeat cbc/bmp/lft at follow up 2. F/u with cardiology for heart failure management, lasix dose increased 3. Spep/upep pending at time of discharge, follow up with oncology if spep/upep indicate multiple myeloma relapse   Discharge Diagnoses:  Active Hospital Problems   Diagnosis Date Noted  . Atypical chest pain   . History of myocardial infarction 04/01/2017  . CKD (chronic kidney disease), stage III   . Chest pain 02/21/2017  . Acute on chronic systolic and diastolic heart failure, NYHA class 4 ([place] 02/21/2017  . Essential hypertension 02/21/2017  . CAD S/P percutaneous coronary angioplasty 02/21/2017  . Type 2 diabetes mellitus with stage 3 chronic kidney disease, without long-term current use of insulin ([place] 02/21/2017    Resolved Hospital Problems   Diagnosis Date Noted Date Resolved  No resolved problems to display.    Discharge Condition: stable  Diet recommendation: heart healthy  Filed Weights   04/06/17 0539 04/07/17 0500 04/08/17 0433  Weight: 85.9 kg (189 lb 6 oz) 86.4 kg (190 lb 7.6 oz) 88.1 kg (194 lb 3.6 oz)    History of present illness:  PCP: System, Pcp Not In   Patient coming from: home   Chief Complaint: dyspnea   HPI: David Guerra a 83y.o. male with known multiple myeloma, CAD s/p PCI last December and VF arrest with ICF, CHD EF 35%, IDDM, recent DVT on Eliquis, multiple recent admissions for dyspnea in April and early May this year. Just few days prior to this admission pt underwent Myoview that showed single fixed defect. Pt was just discharged home 5/10 and now presented back with same concern of persistent dyspnea. Pt explains he has had some intermittent chest  discomfort, pressure like and last few minutes at this time, worse with exertion and better with rest, with no specific radiating symptoms. Pt denies fevers, chills, no specific abd or urinary concerns.   ED Course: In ED, pt is hemodynamically stable, VSS, initial oxygen saturation 86% on RA but improved to 100 with 2 L [place]. Blood work notable for Hg 9.3 (was 9.8 on May 11th), Na 134, K 3.2, Cr up from recent May 11th: 1.77 --> 2.16. TRH asked to admit for observation to telemetry unit.   Hospital Course:  Active Problems:   Chest pain   Acute on chronic systolic and diastolic heart failure, NYHA class 4 (HCC)   Essential hypertension   CAD S/P percutaneous coronary angioplasty   Type 2 diabetes mellitus with stage 3 chronic kidney disease, without long-term current use of insulin (HCC)   CKD (chronic kidney disease), stage III   History of myocardial infarction   Atypical chest pain   Acute Respiratory Failure with Hypoxia ( required 3liter oxygen on admission)/dyspnea -he report chronic dyspnea even before his heart attach in 11/2016, this is his 5th hospitalization at [facility] since 02[facility]2017, [facility] in [place] -dyspnea likely multifactorial, including chf, pulmonary hypertension,  Deconditioning, anemia, +/- pneumonia, he did have one time fever of 101  (on the night of 5/13-5/14),  but he denies cough, no leukocytosis, cxr on 5/13 :Cardiac enlargement with pulmonary vascular congestion and mild interstitial edema. Small bilateral pleural effusions.  Perihilar infiltrates seen previously have improved --Blood Cx x2 negative, mrsa screening negative, he does not cough - she received Empiric Abx with Vanc/Cefepime initially, change to oral augmentin on 5/16 -continue diuresis, prbc transfusionx1 unit onf 5/16, patient has also been on amiodarone ( he does not know the reason for it) will d/c due to  pulmonary side effect -ambulate, wean oxygen  Acute on Chronic combined Systolic and DiastolicCHF: --CXR showed Cardiac enlargement with pulmonary vascular congestion and mild interstitial edema. Small bilateral pleural effusions. Perihilar infiltrates seen previously have improved. -LVEF 35-40 percent by echo 02/2017 -BNP was 644.2 and on 5/11 it was 639.5, troponin mild and flat -Strict I's and O's and Daily Weights. -Patient is Negative 1.4 liter at time of discharge -he received iv lasix in the hospital , hypoxia has resolved, persistent dyspnea but improving, ct chest with pleural effusion, patient is discharged on increased dose of lasix ( he was on 57m bid, he is discharged on 855min am and 401mn pm)  , patient is advised to perform daily weight and call his cardiologist if weight go up more than three lbs -Follow up with Cardiology as an outpatient  Elevated lft: from liver congestion? Amiodarone? Statin Due to recent stent, will continue statin, will d/c amiodarone. Increase lasix,  Repeat lft at hospital follow up, expect improving with increase lasix for better diuresis.   Coronary Artery Disease/Ischemic Cardiomyopathys/pVF arrestwith ICD: -Recent ICD placement and [place], Per Cards Myoview on 03/29/17 showed scar but no ischemia -Troponin Flat at 0.05, 0.04, and 0.05 -Continue Hydralazine, Isordil, Beta Blocker , Statin,  Plavix, -d/c  Amiodarone due to chronic dyspnea and lft elevation --Follow up with Cardiology as an outpatient  Hypertension -he is discharged on on Carvedilol 12.5 mg po bid, hydralazine 75tid , Isosorbide Dinitrate 30 mg TID,  lasix 71m13mily qam and 40mg21m.    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 -a1c 5.7 in 03/2017 -Glipizide Held in the hospital, resumed at discharge -Had Sensitive Novolog SSI with meals and Bedtime while Hospitalized     Chronic CKD stage III -Cr  2.16 -> 1.97 -> 1.89 -> 1.92-1.83-1.78 --ua no infection, no blood, no protein, renal [place] medical renal disease, no hydro, normal appearance bladder Per daughter, patient required short term dialysis in 11/2016  Normocytic Anemia: which could contribute to dyspnea -Baseline 9-10, -Hb/Hct was 7.5/22.7 and went to 7.7/23.2 -ua no blood, FOBT negative , stool is brown, ( he report polypectomy in 2017, denies blood in stool),  -prbc x1unit on 5/16  -He also has h/o multiple myeloma, recent CTA chest on 5/8 concerning "Diffuse osteopenia, consistent with multiple myeloma, suggesting diffuse marrow involvement." -patient does has elevated cr, but calcium seems ok, spep/upep pending at time of discharge  H/o multiple myeloma: Unknown detail, due to droping h/h, upep/spep pending at time of discharge, outpatient follow up.  Hx of Prostate Cancer/BPH -C/w Tamsulosin 0.4 mg po Daily  Hx of Gout -C/w with Allopurinol 300 mg po daily  FTT: PT eval, home health   DVT prophylaxis: Anticoagulated with Apixaban Code Status: FULL CODE Family Communication: daughter over the phone Disposition Plan: Home Health PT; Supervision/Assistance -24 hour  Consultants:   None   Procedures:  None   Antimicrobials:             Anti-infectives     Start        Dose/Rate  Route  Frequency  Ordered  Stop    04/08/17 1430   amoxicillin-clavulanate (AUGMENTIN) 500-125 MG per tablet 500 mg      1 tablet  Oral  2 times daily  04/08/17 1407       04/06/17 1200   ceFEPIme (MAXIPIME) 1 g in dextrose 5 % 50 mL IVPB  Status:  Discontinued      1 g  100 mL/hr over 30 Minutes  Intravenous  Every 24 hours  04/06/17 1100  04/08/17 1404    04/06/17 1200   vancomycin  (VANCOCIN) IVPB 1000 mg/200 mL premix  Status:  Discontinued      1,000 mg  200 mL/hr over 60 Minutes  Intravenous  Every 24 hours  04/06/17 1100  04/07/17 1043      Discharge Exam: BP (!) 139/57 (BP Location: Right Arm)   Pulse 60   Temp 98.5 F (36.9 C) (Oral)   Resp 18   Ht _0  (1.727 m)   Wt 88.1 kg (194 lb 3.6 oz)   SpO2 96%   BMI 29.53 kg/m   General: NAD Cardiovascular: paced rhythm Respiratory: diminished at basis, no wheezing, no rhonchi Abdomen: NT/ND, +BS Extremity: right lower extremity edema ( recent diagnose of DVT in 02/2017)  Discharge Instructions You were cared for by a hospitalist during your hospital stay. If you have any questions about your discharge medications or the care you received while you were in the hospital after you are discharged, you can call the unit and asked to speak with the hospitalist on call if the hospitalist that took care of you is not available. Once you are discharged, your primary care physician will handle any further medical issues. Please note that NO REFILLS for any discharge medications will be authorized once you are discharged, as it is imperative that you return to your primary care physician (or establish a relationship with a primary care physician if you do not have one) for your aftercare needs so that they can reassess your need for medications and monitor your lab values.  Discharge Instructions    Diet - low sodium heart healthy    Complete by:  As directed    Discharge instructions    Complete by:  As directed    Please weight your self daily, please call your heart doctor if your weight go up in 3lbs in less than three days Please limit amount of free water intake to less than 1500cc a day   Increase activity slowly    Complete by:  As directed      Allergies as of 04/09/2017   No Known Allergies     Medication List    STOP taking these medications   amiodarone 200 MG tablet Commonly known as:   PACERONE     TAKE these medications   allopurinol 300 MG tablet Commonly known as:  ZYLOPRIM Take 300 mg by mouth every morning.   amoxicillin-clavulanate 500-125 MG tablet Commonly known as:  AUGMENTIN Take 1 tablet (500 mg total) by mouth 2 (two) times daily.   atorvastatin 40 MG tablet Commonly known as:  LIPITOR Take 40 mg by mouth at bedtime. For lowering cholesterol   carvedilol 12.5 MG tablet Commonly known as:  COREG Take 12.5 mg by mouth every 12 (twelve) hours. For heart  clopidogrel 75 MG tablet Commonly known as:  PLAVIX Take 75 mg by mouth every morning.   ELIQUIS 5 MG Tabs tablet Generic drug:  apixaban Take 5 mg by mouth 2 (two) times daily.   feeding supplement (ENSURE ENLIVE) Liqd Take 237 mLs by mouth 2 (two) times daily between meals.   fluticasone 50 MCG/ACT nasal spray Commonly known as:  FLONASE Place 1 spray into both nostrils at bedtime.   furosemide 80 MG tablet Commonly known as:  LASIX Take 1 tablet (80 mg total) by mouth daily. In the morning What changed:  medication strength  how much to take  when to take this  additional instructions   furosemide 40 MG tablet Commonly known as:  LASIX Take 1 tablet (40 mg total) by mouth daily at 2 PM. What changed:  You were already taking a medication with the same name, and this prescription was added. Make sure you understand how and when to take each.   glipiZIDE 5 MG tablet Commonly known as:  GLUCOTROL Take 2.5 mg by mouth every morning.   hydrALAZINE 25 MG tablet Commonly known as:  APRESOLINE Take 3 tablets (75 mg total) by mouth every 6 (six) hours.   isosorbide dinitrate 30 MG tablet Commonly known as:  ISORDIL Take 1 tablet (30 mg total) by mouth 3 (three) times daily.   latanoprost 0.005 % ophthalmic solution Commonly known as:  XALATAN Place 1 drop into both eyes at bedtime.   nitroGLYCERIN 0.4 MG SL tablet Commonly known as:  NITROSTAT Place 1 tablet (0.4 mg total)  under the tongue every 5 (five) minutes as needed for chest pain. Up to 3 doses per episode of chest pain.   pantoprazole 40 MG tablet Commonly known as:  PROTONIX Take 1 tablet (40 mg total) by mouth daily.   potassium chloride SA 20 MEQ tablet Commonly known as:  K-DUR,KLOR-CON Take 20 mEq by mouth 2 (two) times daily.   tamsulosin 0.4 MG Caps capsule Commonly known as:  FLOMAX Take 0.4 mg by mouth every morning.   vitamin B-12 1000 MCG tablet Commonly known as:  CYANOCOBALAMIN Take 1,000 mcg by mouth every morning.      No Known Allergies Follow-up Information    Schedule an appointment as soon as possible for a visit with Minus Breeding, MD.   Specialty:  Cardiology Why:  for heart failure management Contact information: [address] [place] [address] [phone]            The results of significant diagnostics from this hospitalization (including imaging, microbiology, ancillary and laboratory) are listed below for reference.    Significant Diagnostic Studies: Dg Chest 2 View  Result Date: 04/05/2017 CLINICAL DATA:  Mid chest pain since noon today with shortness of breath. History of CHF, defibrillator, cancer, coronary artery disease, diabetes, hypertension EXAM: CHEST  2 VIEW COMPARISON:  04/03/2017 FINDINGS: Cardiac pacemaker. Cardiac enlargement with pulmonary vascular congestion. Mild interstitial changes in the lungs likely representing mild edema. Perihilar infiltrates seen previously have improved. Mild blunting of costophrenic angles suggesting small effusions. Degenerative changes in the spine. IMPRESSION: Cardiac enlargement with pulmonary vascular congestion and mild interstitial edema. Small bilateral pleural effusions. Perihilar infiltrates seen previously have improved. Electronically Signed   By: Lucienne Capers M.D.   On: 04/05/2017 01:27   Dg Chest 2 View  Result Date: 04/03/2017 CLINICAL DATA:  Dyspnea beginning this morning.  EXAM: CHEST  2 VIEW COMPARISON:  PA and lateral chest and CT chest  03/31/2017. FINDINGS: Bilateral effusions and airspace disease appear with worse than on the prior plain films. There is cardiomegaly. Pacing device is in place. IMPRESSION: Increased bilateral effusions and airspace disease could be due to asymmetric pulmonary edema and/or pneumonia. Edema is favored. Electronically Signed   By: Inge Rise M.D.   On: 04/03/2017 13:13   Dg Chest 2 View  Result Date: 03/31/2017 CLINICAL DATA:  Shortness of breath and chest pain EXAM: CHEST  2 VIEW COMPARISON:  Chest radiograph 03/26/2017 FINDINGS: Left chest wall AICD leads are in unchanged position. Cardiomegaly is unchanged. No focal airspace consolidation or pulmonary edema. There is pulmonary vascular congestion. No pleural effusion or pneumothorax. IMPRESSION: Pulmonary vascular congestion without overt pulmonary edema. Unchanged cardiomegaly. Electronically Signed   By: Ulyses Jarred M.D.   On: 03/31/2017 15:54   Dg Chest 2 View  Result Date: 03/26/2017 CLINICAL DATA:  LEFT chest pain and shortness of breath, history of coronary artery disease post MI and coronary stenting, hypertension, diabetes mellitus, multiple myeloma EXAM: CHEST  2 VIEW COMPARISON:  03/05/2017 FINDINGS: LEFT subclavian transvenous pacemaker/AICD leads project at RIGHT atrium and RIGHT ventricle unchanged since previous exam. Enlargement of cardiac silhouette with pulmonary vascular congestion. Stable mediastinal contours. Chronic bronchitic changes with RIGHT basilar atelectasis. Question small RIGHT pleural effusion No acute infiltrate, LEFT pleural effusion or pneumothorax. Bones demineralized with endplate spur formation thoracic spine. IMPRESSION: Enlargement of cardiac silhouette with pulmonary vascular congestion. Bronchitic changes with RIGHT basilar atelectasis and suspect small RIGHT pleural effusion. No acute infiltrate. Electronically Signed   By: Lavonia Dana M.D.    On: 03/26/2017 17:48   Ct Chest Wo Contrast  Result Date: 04/09/2017 CLINICAL DATA:  Persistent shortness of breath EXAM: CT CHEST WITHOUT CONTRAST TECHNIQUE: Multidetector CT imaging of the chest was performed following the standard protocol without IV contrast. COMPARISON:  Chest CT Mar 31, 2017 and chest radiograph Apr 05, 2017 FINDINGS: Cardiovascular: There is no appreciable thoracic aortic aneurysm. The visualized great vessels appear unremarkable. The right and left common carotid arteries arise as a common trunk, an anatomic variant. There is a small, stable pericardial effusion. Pacemaker leads are attached to the right atrium and right ventricle. There is atherosclerotic calcification at sites in the aorta as well as foci of coronary artery calcification. Heart remains mildly enlarged. Mediastinum/Nodes: Thyroid appears unremarkable and stable. There is no appreciable thoracic adenopathy. Lungs/Pleura: Pleural effusions are noted bilaterally, larger on the right than on the left, essentially stable. There is persistent consolidation in both lung bases posteriorly, largely felt to be due to compressive atelectasis. There is patchy ground-glass type appearance throughout the lungs in a pattern similar to recent CT. No new consolidation or airspace opacity seen. The interstitium is slightly thickened in the upper lobes. Upper Abdomen: There is atherosclerotic calcification in the abdominal aorta. Multiple small apparent cysts are present in the liver, unchanged from recent study. Visualized upper abdominal structures otherwise appear unremarkable. Musculoskeletal: Bones are markedly osteoporotic. There are wedge fractures at several levels in the lower thoracic and upper lumbar region. There is degenerative change in the thoracic spine. There are no lytic or destructive bone lesions. IMPRESSION: Essentially stable appearance compared to recent CT examination. Findings felt to be most consistent with  congestive heart failure. There is atelectatic change in the lung bases with questionable superimposed pneumonia in the lung bases. Areas of patchy ground-glass opacity are felt to most likely represent pulmonary edema. Atypical infection is a differential consideration for the findings, however.  No adenopathy. Small pericardial effusion, stable. Marked osteoporosis with multiple compression fractures. Areas of atherosclerotic calcification in the aorta. Coronary artery calcification noted. Pacemaker leads attached to right atrium and right ventricle. Electronically Signed   By: Lowella Grip III M.D.   On: 04/09/2017 14:14   Ct Angio Chest Pe W Or Wo Contrast  Result Date: 03/31/2017 CLINICAL DATA:  Chest pain and history of congestive heart failure. Shortness of breath. EXAM: CT ANGIOGRAPHY CHEST WITH CONTRAST TECHNIQUE: Multidetector CT imaging of the chest was performed using the standard protocol during bolus administration of intravenous contrast. Multiplanar CT image reconstructions and MIPs were obtained to evaluate the vascular anatomy. CONTRAST:  80 mL Isovue 370 IV COMPARISON:  Chest radiograph 03/31/2017 FINDINGS: Cardiovascular: Contrast injection is sufficient to demonstrate satisfactory opacification of the pulmonary arteries to the segmental level. There is no pulmonary embolus. The main pulmonary artery is mildly enlarged, measuring 3.3 cm at the bifurcation. There is no CT evidence of acute right heart strain. There is mild atherosclerotic calcification of the aorta. There is a normal variant aortic arch branching pattern with the brachiocephalic and left common carotid arteries sharing a common origin. Heart size is enlarged, with a small pericardial effusion measuring 6 mm in thickness. There are AICD leads and coronary artery stents. Mediastinum/Nodes: No mediastinal, hilar or axillary lymphadenopathy. The visualized thyroid and thoracic esophageal course are unremarkable. Lungs/Pleura:  There are large right and small left pleural effusions. There are multifocal ground-glass opacities throughout both lungs in a pattern most suggestive of pulmonary edema. There is bibasilar atelectasis. Upper Abdomen: Contrast bolus timing is not optimized for evaluation of the abdominal organs. There are multiple hypoattenuating lesions scattered throughout the liver, measuring up to 1.7 cm. The other visualized portions of the upper abdominal organs are normal. Musculoskeletal: There is diffuse osteopenia with a trabeculated appearance of all of the vertebral bodies. Review of the MIP images confirms the above findings. IMPRESSION: 1. No pulmonary embolus. 2. Cardiomegaly, small pericardial effusion, bilateral pleural effusions and multifocal ground-glass opacities, most consistent with congestive heart failure. 3. Mildly enlarged main pulmonary artery, which may be seen in the setting of pulmonary hypertension. 4. Diffuse osteopenia, consistent with multiple myeloma, suggesting diffuse marrow involvement. 5. Aortic atherosclerosis. Electronically Signed   By: Ulyses Jarred M.D.   On: 03/31/2017 17:55   US Renal  Result Date: 04/08/2017 CLINICAL DATA:  Elevated creatinine. History of chronic renal insufficiency, hypertension, and diabetes. EXAM: RENAL / URINARY TRACT ULTRASOUND COMPLETE COMPARISON:  Limited views of the abdomen from a chest CT scan of 31 Mar 2017. FINDINGS: Right Kidney: Length: 11.5 cm. The renal cortical echotexture is approximately equal to that of the adjacent liver. There is a parapelvic cyst measuring 3.9 x 4.3 x 3.9 cm. There is no hydronephrosis. Left Kidney: Length: 10.9 cm. The renal cortical echotexture is increased similar to that on the right. There is no hydronephrosis. Bladder: Appears normal for degree of bladder distention. There are bilateral pleural effusions. IMPRESSION: Increased renal cortical echotexture bilaterally consistent with medical renal disease. There is no  hydronephrosis. There is a parapelvic cyst on the right measuring up to 4.3 cm in greatest dimension. Normal appearance of the urinary bladder. Small bilateral pleural effusions. Electronically Signed   By: David  Martinique M.D.   On: 04/08/2017 14:06   Nm Myocar Multi W/spect W/wall Motion / Ef  Result Date: 03/29/2017 CLINICAL DATA:  Chest pain and history of myocardial infarction. EXAM: MYOCARDIAL IMAGING WITH SPECT (REST AND PHARMACOLOGIC-STRESS)  GATED LEFT VENTRICULAR WALL MOTION STUDY LEFT VENTRICULAR EJECTION FRACTION TECHNIQUE: Standard myocardial SPECT imaging was performed after resting intravenous injection of 10 mCi Tc-37mtetrofosmin. Subsequently, intravenous infusion of Lexiscan was performed under the supervision of the Cardiology staff. At peak effect of the drug, 30 mCi Tc-927metrofosmin was injected intravenously and standard myocardial SPECT imaging was performed. Quantitative gated imaging was also performed to evaluate left ventricular wall motion, and estimate left ventricular ejection fraction. COMPARISON:  None. FINDINGS: Perfusion: There is a medium sized fixed defect involving the mid inferior wall. Wall Motion: Wall motion abnormality with hypokinesis of the inferior and lateral walls. Left Ventricular Ejection Fraction: 38 % End diastolic volume 20885l End systolic volume 12027l IMPRESSION: 1. Single medium sized fixed defect involving the mid inferior wall. No reversibility. 2. Inferolateral wall hypokinesis. 3. Left ventricular ejection fraction 38% 4. Non invasive risk stratification*: Low *2012 Appropriate Use Criteria for Coronary Revascularization Focused Update: J Am Coll Cardiol. 207412;87(8):[email or web address]://[email or web address]?articleid=[email or web address] Electronically Signed   By: TaKerby Moors.D.   On: 03/29/2017 13:30    Microbiology: Recent Results (from the past 240 hour(s))  Culture, blood (routine x 2)     Status: None (Preliminary result)   Collection Time:  04/05/17 10:39 PM  Result Value Ref Range Status   Specimen Description BLOOD LEFT ARM  Final   Special Requests   Final    BOTTLES DRAWN AEROBIC AND ANAEROBIC Blood Culture adequate volume   Culture   Final    NO GROWTH 3 DAYS Performed at [facility] [place] [id number]  Report Status PENDING  Incomplete  Culture, blood (routine x 2)     Status: None (Preliminary result)   Collection Time: 04/05/17 10:39 PM  Result Value Ref Range Status   Specimen Description BLOOD RIGHT ANTECUBITAL  Final   Special Requests   Final    BOTTLES DRAWN AEROBIC AND ANAEROBIC Blood Culture adequate volume   Culture   Final    NO GROWTH 3 DAYS Performed at [facility] [place] [address] [place] [address]  Report Status PENDING  Incomplete  MRSA PCR Screening     Status: None   Collection Time: 04/06/17  1:50 PM  Result Value Ref Range Status   MRSA by PCR NEGATIVE NEGATIVE Final    Comment:        The GeneXpert MRSA Assay (FDA approved for NASAL specimens only), is one component of a comprehensive MRSA colonization surveillance program. It is not intended to diagnose MRSA infection nor to guide or monitor treatment for MRSA infections.      Labs: Basic Metabolic Panel:  Recent Labs Lab 04/06/17 0537 04/06/17 1503 04/07/17 0607 04/08/17 0631 04/09/17 0540  NA 136 135 135 135 138  K 3.8 4.0 4.1 4.0 3.6  CL 101 100* 103 103 103  CO2 _0 GLUCOSE 89 141* 102* 102* 100*  BUN 31* 34* 38* 35* 31*  CREATININE 1.97* 1.89* 1.92* 1.83* 1.78*  CALCIUM 7.9* 8.0* 8.1* 8.2* 8.3*  MG  --   --  2.1 1.9  --   PHOS  --   --  3.3 2.8  --    Liver Function Tests:  Recent Labs Lab 04/07/17 0607 04/08/17 0631 04/09/17 0540  AST 61* 77* 84*  ALT 103* 132* 167*  ALKPHOS 61 60 66  BILITOT 0.8 0.7 0.6  PROT 6.0* 5.8* 6.0*  ALBUMIN  2.7* 2.5* 2.6*   No results for input(s): LIPASE, AMYLASE in the last 168 hours. No results for  input(s): AMMONIA in the last 168 hours. CBC:  Recent Labs Lab 04/03/17 1345 04/05/17 0046 04/06/17 0537 04/07/17 0607 04/08/17 0631 04/09/17 0540  WBC 7.5 9.6 7.0 7.1 6.7 6.2  NEUTROABS 6.4  --   --  5.4 5.0 4.6  HGB 9.8* 9.3* 7.5* 7.7* 7.4* 8.6*  HCT 29.0* 27.8* 22.7* 23.2* 22.9* 25.9*  MCV 85.0 79.2 80.2 78.9 79.0 79.4  PLT 180 167 140* 149* 163 173   Cardiac Enzymes:  Recent Labs Lab 04/05/17 0254 04/05/17 0846 04/05/17 1404 04/05/17 2039  TROPONINI 0.05* 0.05* 0.04* 0.05*   BNP: BNP (last 3 results)  Recent Labs  03/31/17 1554 04/03/17 1345 04/05/17 0046  BNP 1,101.3* 639.5* 644.2*    ProBNP (last 3 results) No results for input(s): PROBNP in the last 8760 hours.  CBG:  Recent Labs Lab 04/08/17 1128 04/08/17 1646 04/08/17 2144 04/09/17 0725 04/09/17 1156  GLUCAP 147* 130* 117* 86 116*       Signed:  Ac Colan MD, PhD  [facility] 04/09/2017, 4:37 PM

## 2017-04-10 LAB — UIFE/LIGHT CHAINS/TP QN, 24-HR UR
% BETA, Urine: 22.1 %
ALPHA 1 URINE: 3.9 %
ALPHA 2 UR: 12.1 %
Albumin, U: 38.4 %
FREE LAMBDA LT CHAINS, UR: 15.1 mg/L — AB (ref 0.24–6.66)
FREE LT CHN EXCR RATE: 189 mg/L — AB (ref 1.35–24.19)
Free Kappa/Lambda Ratio: 12.52 — ABNORMAL HIGH (ref 2.04–10.37)
GAMMA GLOBULIN URINE: 23.6 %
TOTAL VOLUME: 1275
Time: 24 hours
Total Protein, Urine-Ur/day: 167 mg/24 hr — ABNORMAL HIGH (ref 30–150)
Total Protein, Urine: 13.1 mg/dL
VOLUME, URINE-UPE24: 1275 mL

## 2017-04-10 LAB — PROTEIN ELECTROPHORESIS, SERUM
A/G RATIO SPE: 0.9 (ref 0.7–1.7)
ALBUMIN ELP: 2.5 g/dL — AB (ref 2.9–4.4)
ALPHA-1-GLOBULIN: 0.3 g/dL (ref 0.0–0.4)
Alpha-2-Globulin: 0.9 g/dL (ref 0.4–1.0)
Beta Globulin: 0.9 g/dL (ref 0.7–1.3)
GAMMA GLOBULIN: 0.8 g/dL (ref 0.4–1.8)
GLOBULIN, TOTAL: 2.9 g/dL (ref 2.2–3.9)
Total Protein ELP: 5.4 g/dL — ABNORMAL LOW (ref 6.0–8.5)

## 2017-04-11 LAB — CULTURE, BLOOD (ROUTINE X 2)
CULTURE: NO GROWTH
Culture: NO GROWTH
SPECIAL REQUESTS: ADEQUATE
Special Requests: ADEQUATE

## 2018-08-21 IMAGING — DX DG CHEST 2V
2 series · 2 of 2 positions shown · non-contrast
Comparison: 03/05/2017

CLINICAL DATA: LEFT chest pain and shortness of breath, history of
coronary artery disease post MI and coronary stenting, hypertension,
diabetes mellitus, multiple myeloma

EXAM:
CHEST  2 VIEW

[w chest pa]
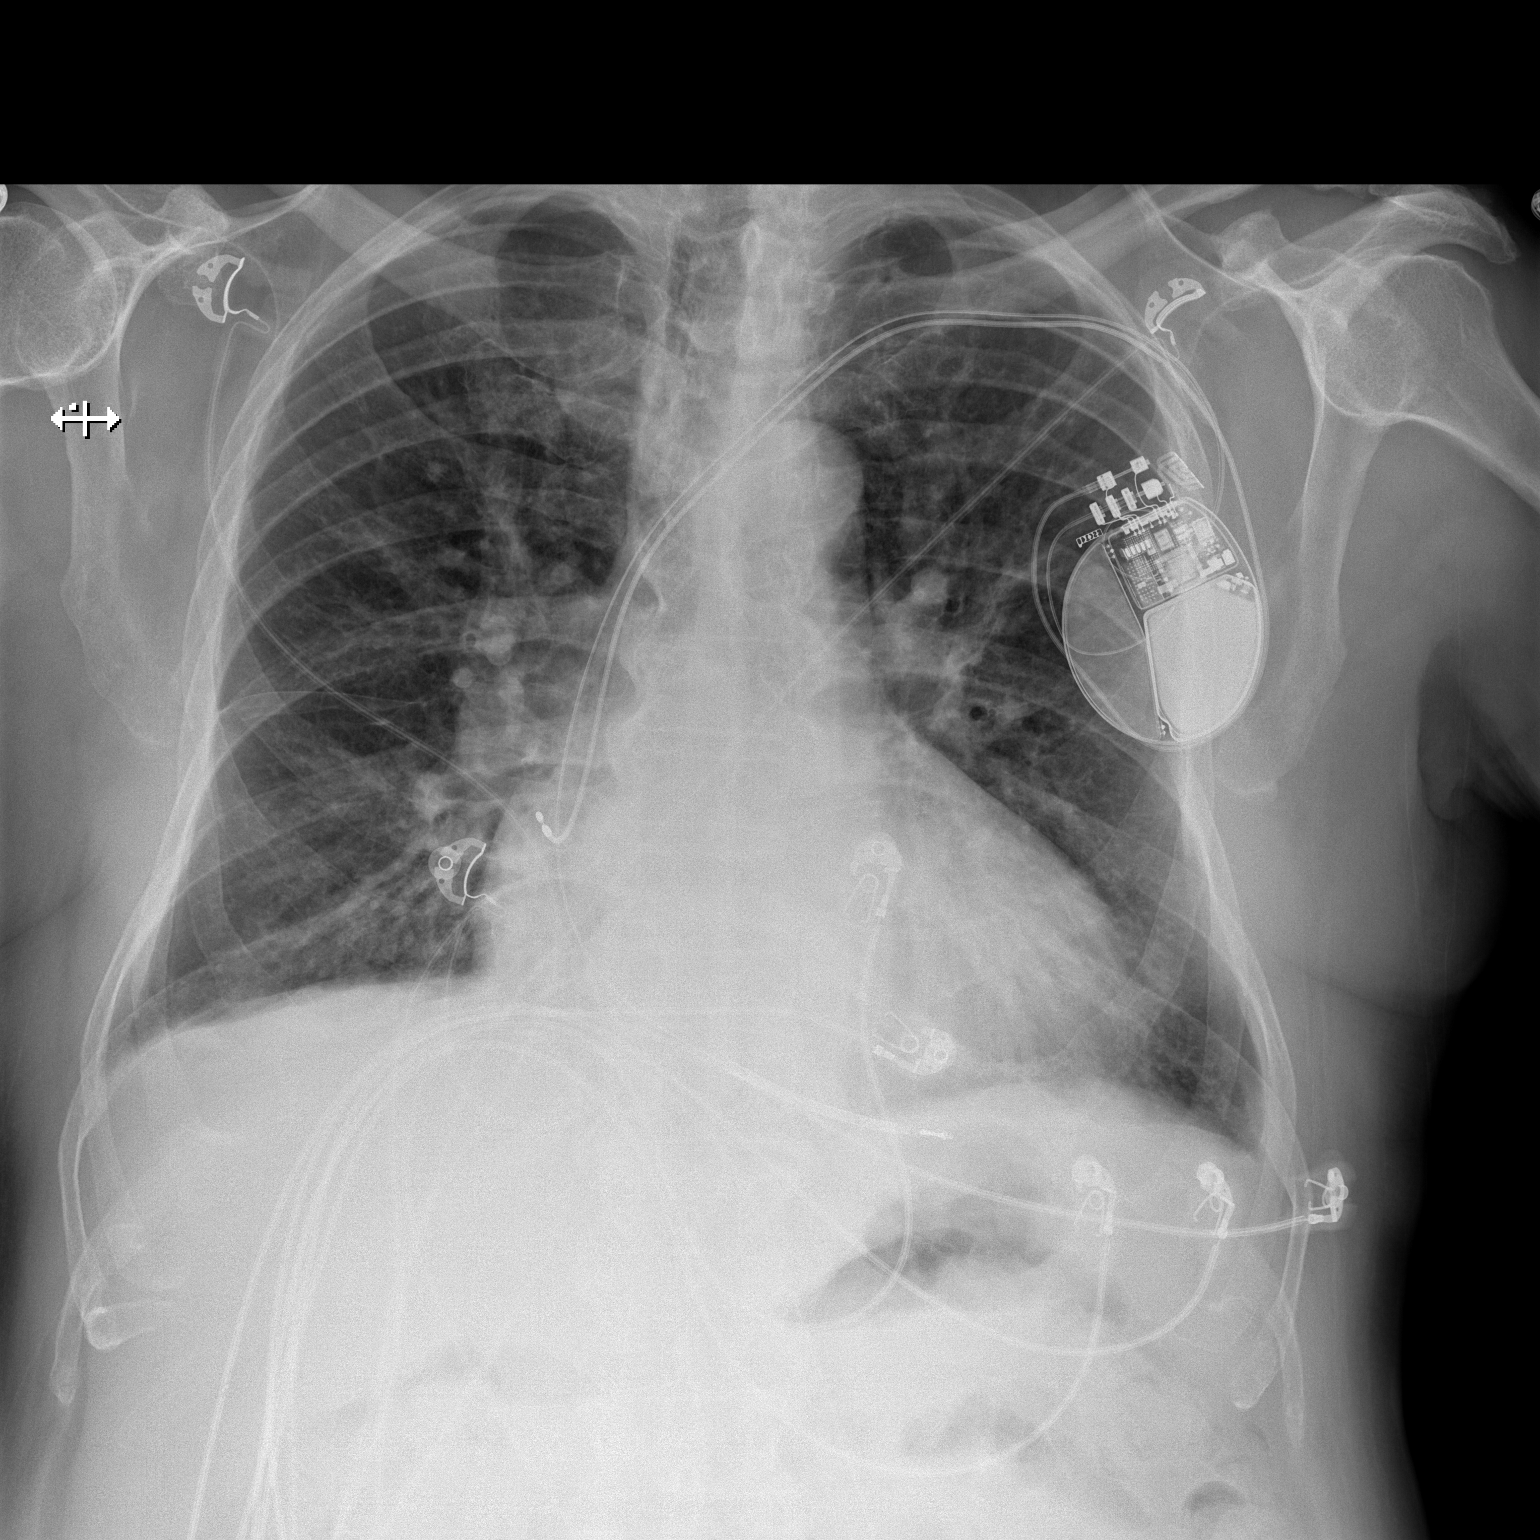

[w chest lat]
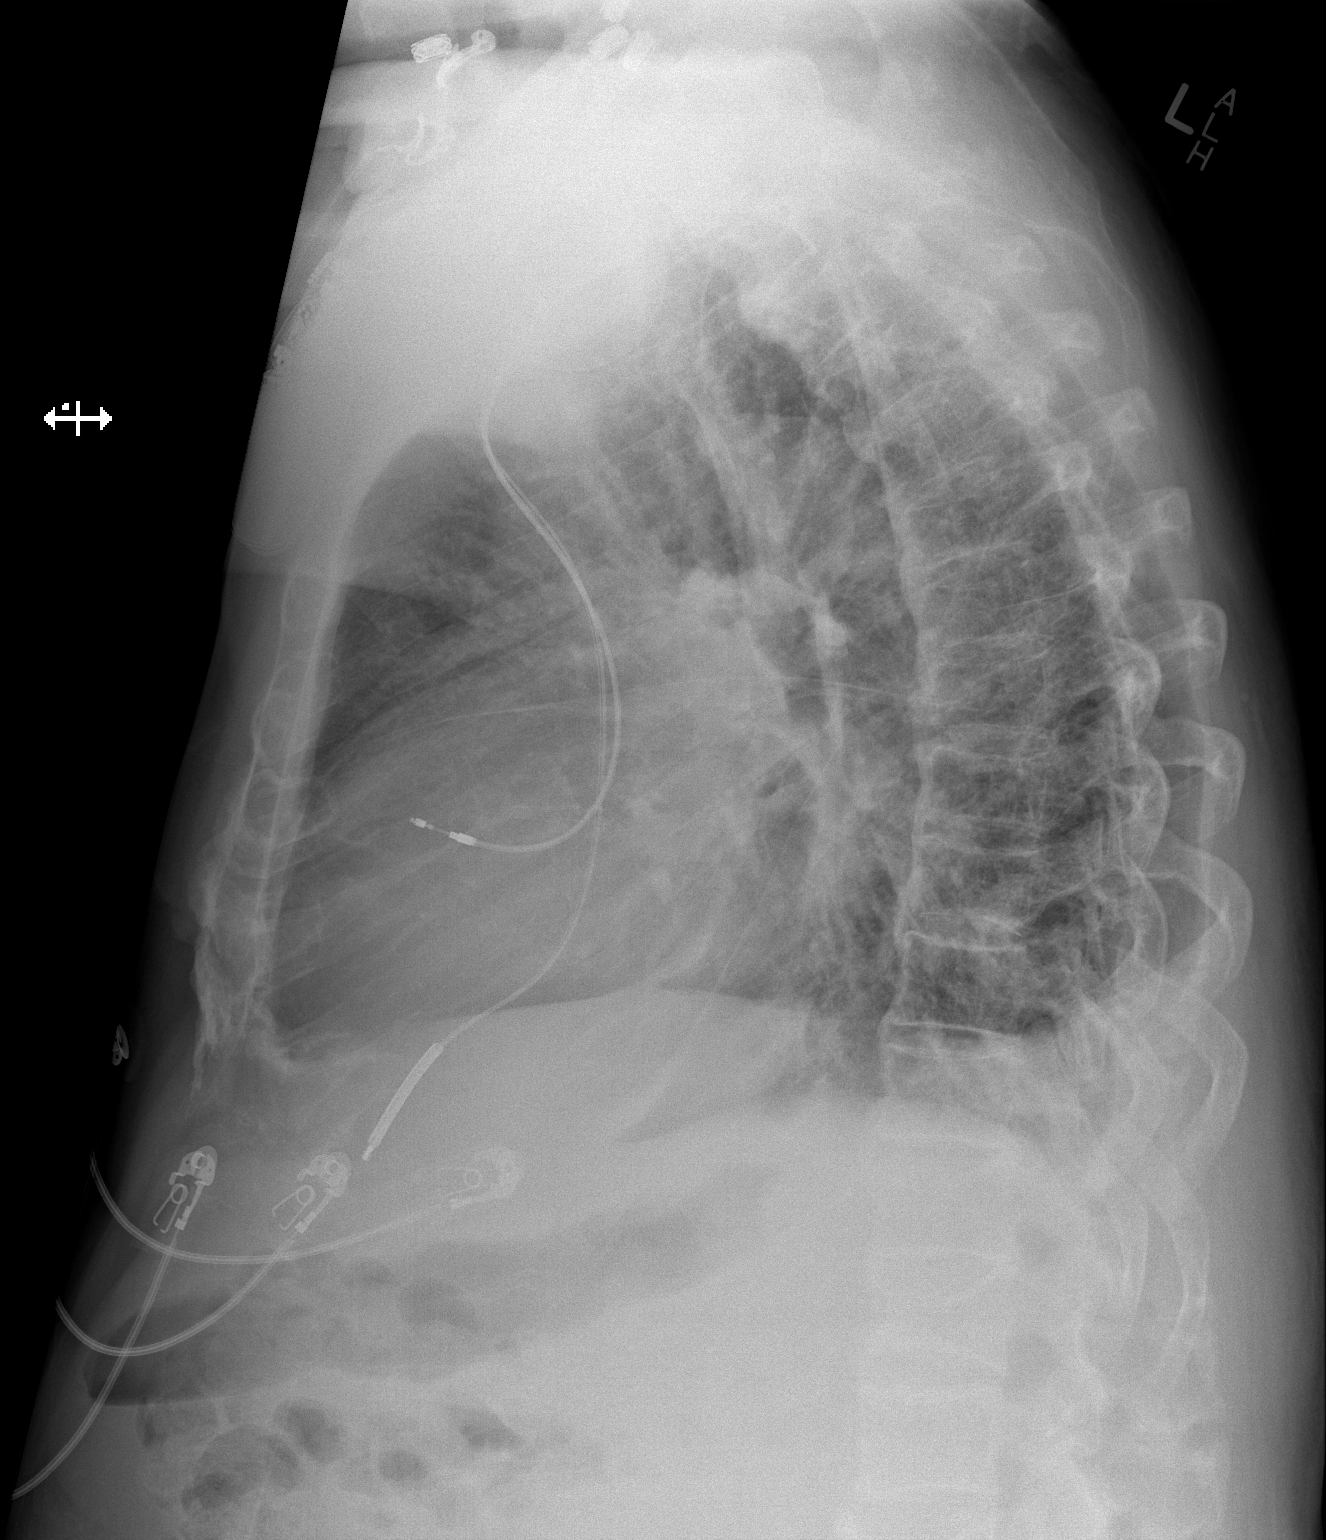

[2 of 2 positions shown; findings below may reference images not displayed]

FINDINGS: LEFT subclavian transvenous pacemaker/AICD leads project at RIGHT
atrium and RIGHT ventricle unchanged since previous exam.

Enlargement of cardiac silhouette with pulmonary vascular
congestion.

Stable mediastinal contours.

Chronic bronchitic changes with RIGHT basilar atelectasis.

Question small RIGHT pleural effusion

No acute infiltrate, LEFT pleural effusion or pneumothorax.

Bones demineralized with endplate spur formation thoracic spine.
IMPRESSION: Enlargement of cardiac silhouette with pulmonary vascular
congestion.

Bronchitic changes with RIGHT basilar atelectasis and suspect small
RIGHT pleural effusion.

No acute infiltrate.

## 2018-08-26 IMAGING — CR DG CHEST 2V
2 series · 2 of 2 positions shown · non-contrast
Comparison: Chest radiograph 03/26/2017

CLINICAL DATA: Shortness of breath and chest pain

EXAM:
CHEST  2 VIEW

[w chest lat]
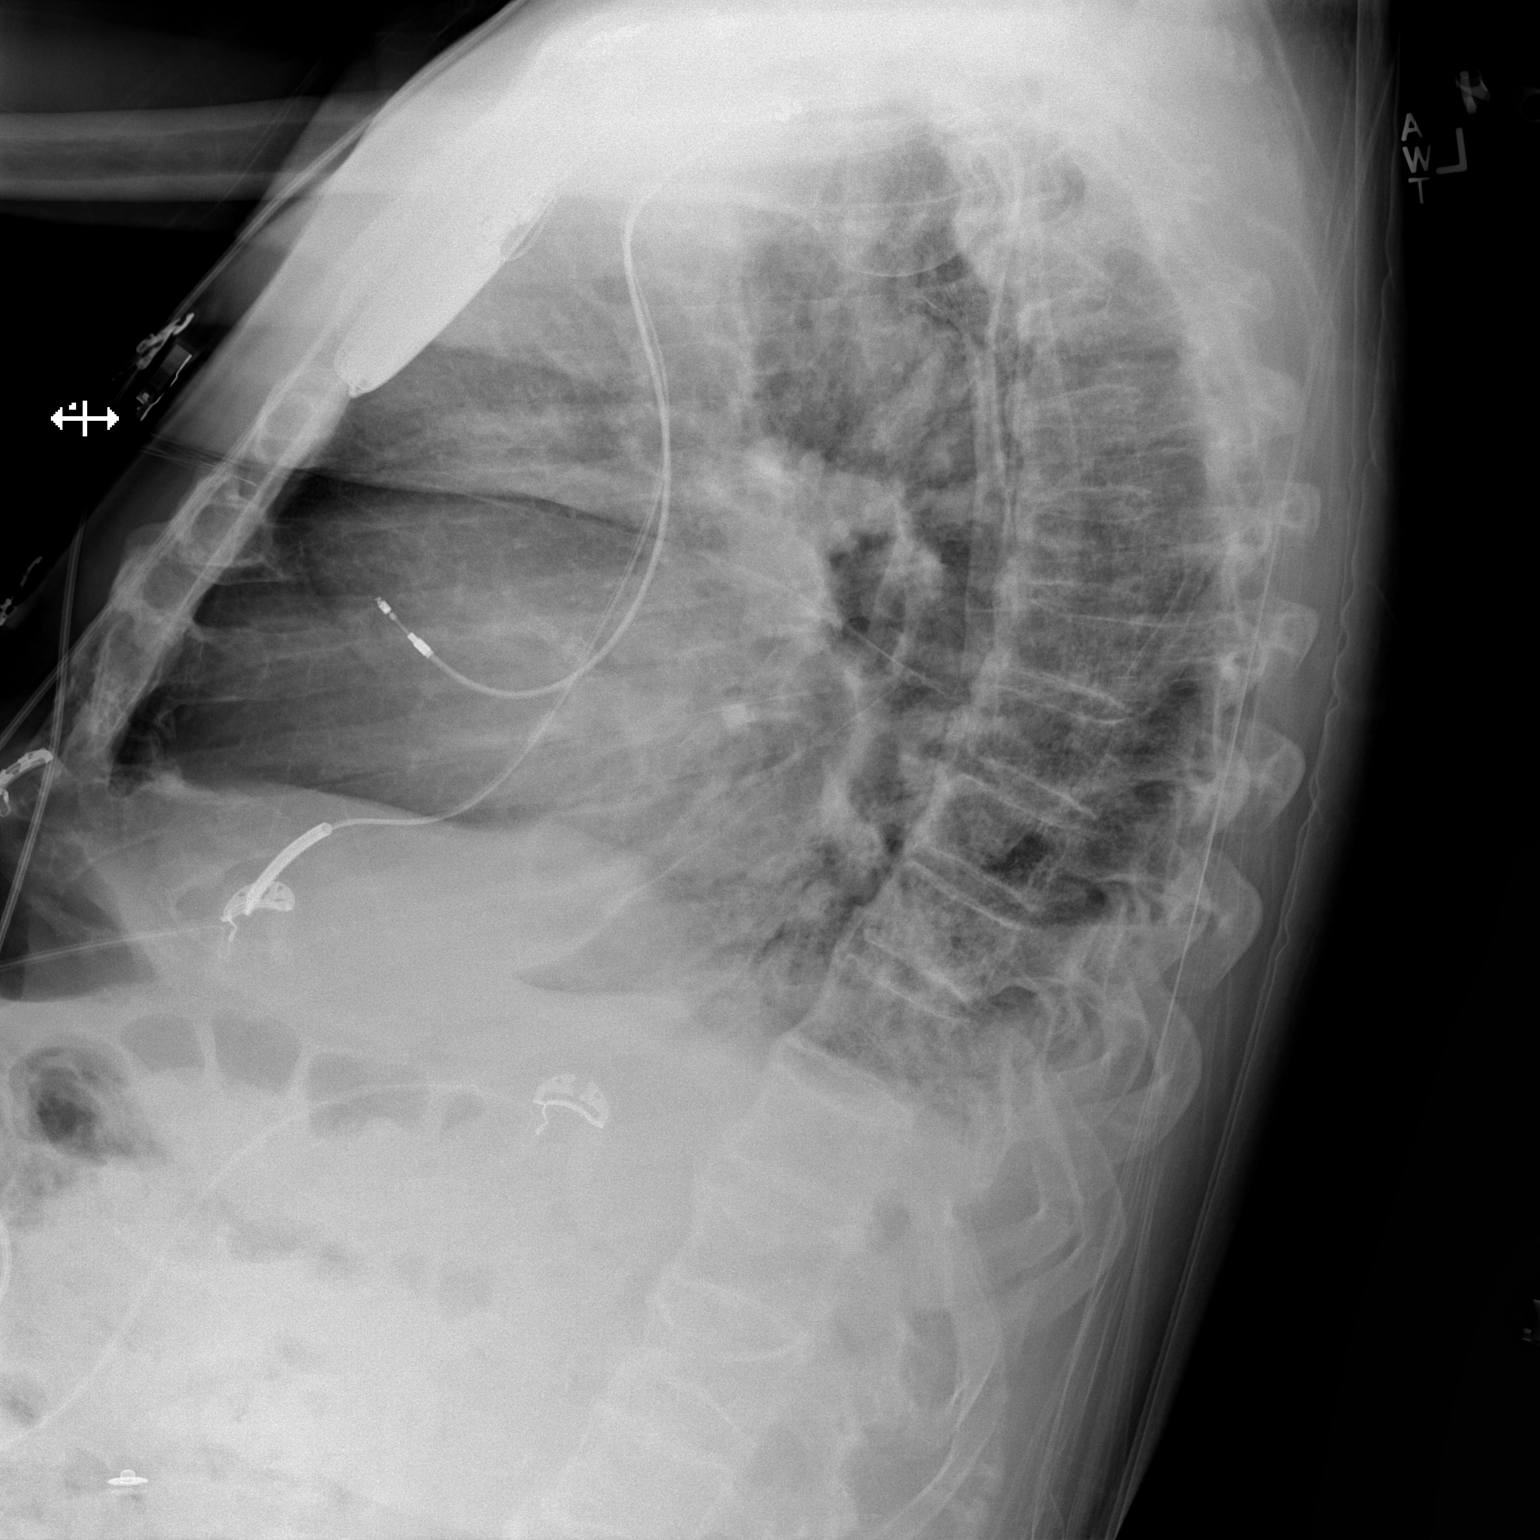

[x chest ap]
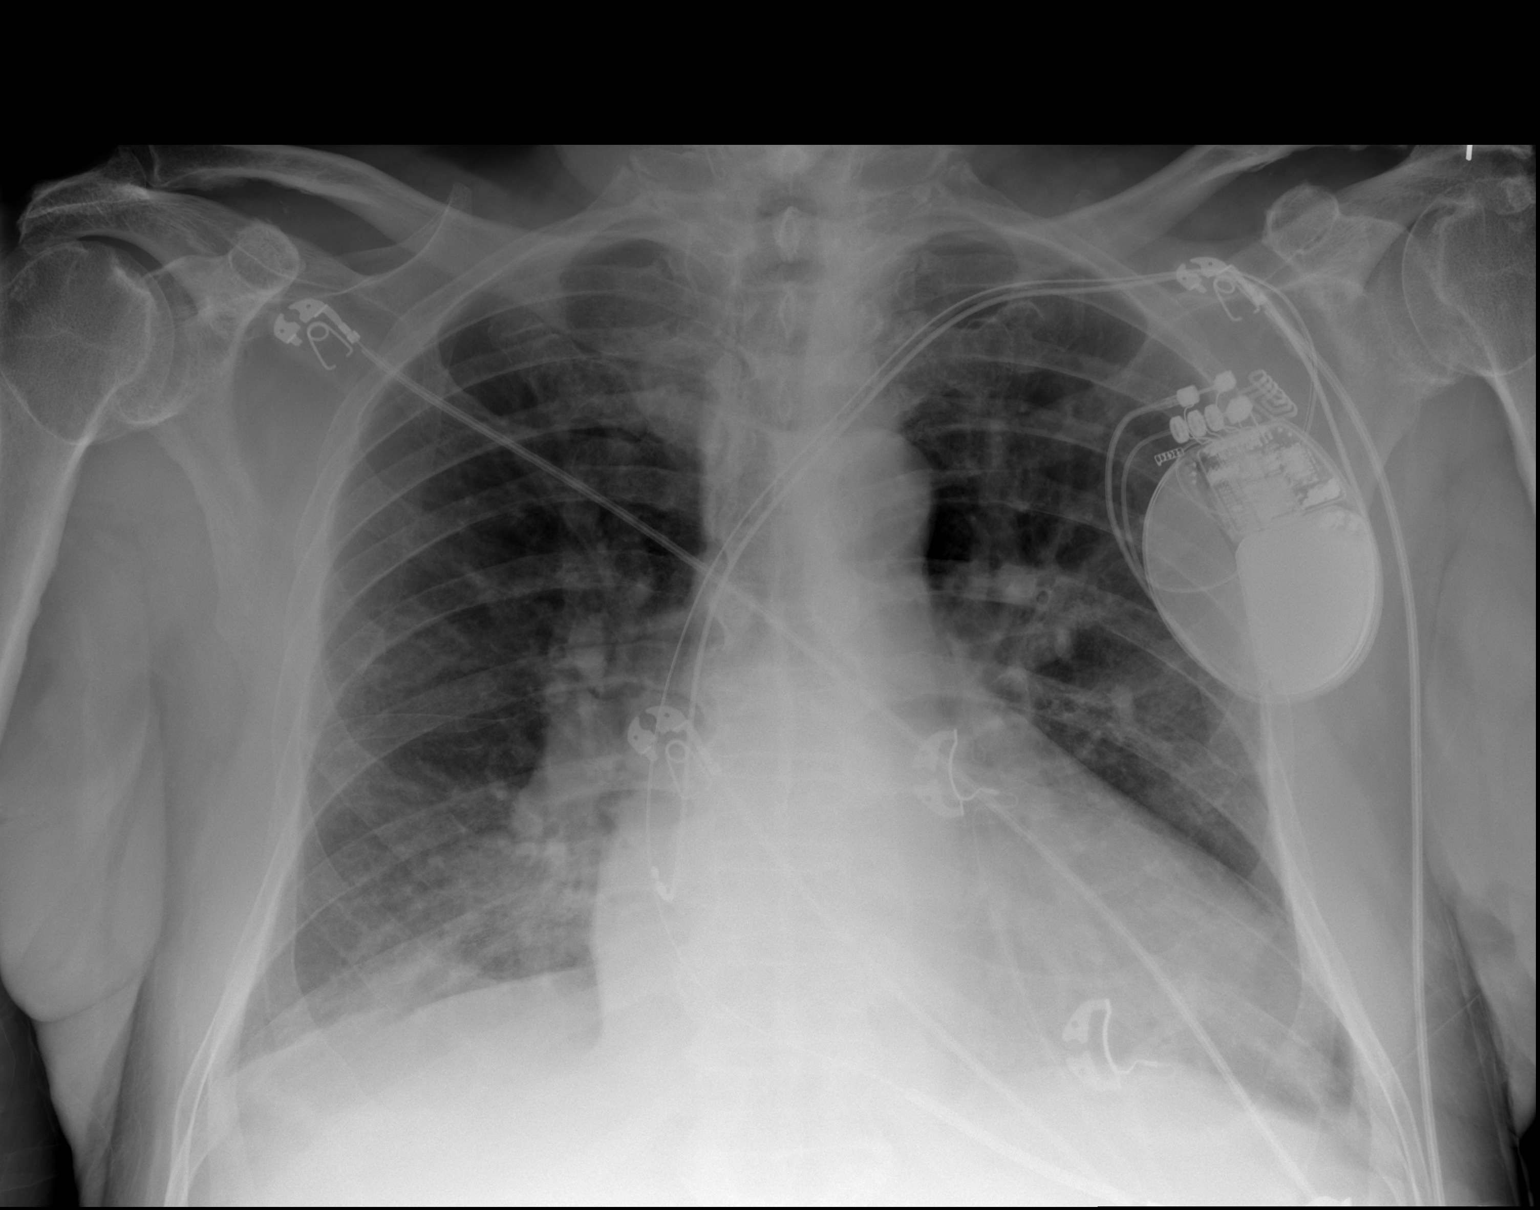

[2 of 2 positions shown; findings below may reference images not displayed]

FINDINGS: Left chest wall AICD leads are in unchanged position. Cardiomegaly
is unchanged. No focal airspace consolidation or pulmonary edema.
There is pulmonary vascular congestion. No pleural effusion or
pneumothorax.
IMPRESSION: Pulmonary vascular congestion without overt pulmonary edema.
Unchanged cardiomegaly.

## 2019-10-25 DEATH — deceased
